# Patient Record
Sex: Female | Born: 1935 | Race: White | Hispanic: No | Marital: Married
Health system: Southern US, Community
[De-identification: ages and names within clinical notes are randomized; demographics above are authoritative.]

## PROBLEM LIST (undated history)

## (undated) DIAGNOSIS — I499 Cardiac arrhythmia, unspecified: Secondary | ICD-10-CM

## (undated) DIAGNOSIS — K625 Hemorrhage of anus and rectum: Secondary | ICD-10-CM

## (undated) DIAGNOSIS — G459 Transient cerebral ischemic attack, unspecified: Secondary | ICD-10-CM

## (undated) DIAGNOSIS — K5792 Diverticulitis of intestine, part unspecified, without perforation or abscess without bleeding: Secondary | ICD-10-CM

## (undated) DIAGNOSIS — I6529 Occlusion and stenosis of unspecified carotid artery: Secondary | ICD-10-CM

## (undated) DIAGNOSIS — I639 Cerebral infarction, unspecified: Secondary | ICD-10-CM

## (undated) DIAGNOSIS — E785 Hyperlipidemia, unspecified: Secondary | ICD-10-CM

## (undated) DIAGNOSIS — I2510 Atherosclerotic heart disease of native coronary artery without angina pectoris: Secondary | ICD-10-CM

## (undated) DIAGNOSIS — D649 Anemia, unspecified: Secondary | ICD-10-CM

## (undated) DIAGNOSIS — Z9981 Dependence on supplemental oxygen: Secondary | ICD-10-CM

## (undated) DIAGNOSIS — J309 Allergic rhinitis, unspecified: Secondary | ICD-10-CM

## (undated) DIAGNOSIS — C55 Malignant neoplasm of uterus, part unspecified: Secondary | ICD-10-CM

## (undated) DIAGNOSIS — Z9289 Personal history of other medical treatment: Secondary | ICD-10-CM

## (undated) DIAGNOSIS — K449 Diaphragmatic hernia without obstruction or gangrene: Secondary | ICD-10-CM

## (undated) DIAGNOSIS — I4891 Unspecified atrial fibrillation: Secondary | ICD-10-CM

## (undated) DIAGNOSIS — E039 Hypothyroidism, unspecified: Secondary | ICD-10-CM

## (undated) DIAGNOSIS — R0602 Shortness of breath: Secondary | ICD-10-CM

## (undated) DIAGNOSIS — J449 Chronic obstructive pulmonary disease, unspecified: Secondary | ICD-10-CM

## (undated) DIAGNOSIS — I10 Essential (primary) hypertension: Secondary | ICD-10-CM

## (undated) DIAGNOSIS — M810 Age-related osteoporosis without current pathological fracture: Secondary | ICD-10-CM

## (undated) HISTORY — DX: Cerebral infarction, unspecified: I63.9

## (undated) HISTORY — DX: Essential (primary) hypertension: I10

## (undated) HISTORY — DX: Cardiac arrhythmia, unspecified: I49.9

## (undated) HISTORY — DX: Shortness of breath: R06.02

## (undated) HISTORY — PX: CAROTID ENDARTERECTOMY: SUR193

## (undated) HISTORY — DX: Hypothyroidism, unspecified: E03.9

## (undated) HISTORY — DX: Allergic rhinitis, unspecified: J30.9

## (undated) HISTORY — DX: Transient cerebral ischemic attack, unspecified: G45.9

## (undated) HISTORY — PX: CHOLECYSTECTOMY: SHX55

## (undated) HISTORY — PX: OTHER SURGICAL HISTORY: SHX169

## (undated) HISTORY — DX: Diaphragmatic hernia without obstruction or gangrene: K44.9

## (undated) HISTORY — DX: Unspecified atrial fibrillation: I48.91

## (undated) HISTORY — DX: Anemia, unspecified: D64.9

---

## 1968-04-29 HISTORY — PX: TUBAL LIGATION: SHX77

## 1997-10-24 ENCOUNTER — Ambulatory Visit (HOSPITAL_COMMUNITY): Admission: RE | Admit: 1997-10-24 | Discharge: 1997-10-24 | Payer: Self-pay | Admitting: Internal Medicine

## 1997-10-26 ENCOUNTER — Ambulatory Visit (HOSPITAL_COMMUNITY): Admission: RE | Admit: 1997-10-26 | Discharge: 1997-10-26 | Payer: Self-pay | Admitting: Internal Medicine

## 1997-10-27 ENCOUNTER — Ambulatory Visit (HOSPITAL_COMMUNITY): Admission: RE | Admit: 1997-10-27 | Discharge: 1997-10-27 | Payer: Self-pay | Admitting: Internal Medicine

## 2001-03-07 ENCOUNTER — Inpatient Hospital Stay (HOSPITAL_COMMUNITY): Admission: EM | Admit: 2001-03-07 | Discharge: 2001-03-10 | Payer: Self-pay | Admitting: Internal Medicine

## 2001-03-07 ENCOUNTER — Encounter: Payer: Self-pay | Admitting: Emergency Medicine

## 2001-03-07 ENCOUNTER — Encounter: Payer: Self-pay | Admitting: Internal Medicine

## 2001-03-08 ENCOUNTER — Encounter: Payer: Self-pay | Admitting: Pediatrics

## 2001-10-20 ENCOUNTER — Encounter: Payer: Self-pay | Admitting: Pediatrics

## 2001-10-20 ENCOUNTER — Observation Stay (HOSPITAL_COMMUNITY): Admission: EM | Admit: 2001-10-20 | Discharge: 2001-10-21 | Payer: Self-pay | Admitting: Emergency Medicine

## 2001-10-21 ENCOUNTER — Encounter: Payer: Self-pay | Admitting: Pediatrics

## 2003-02-17 ENCOUNTER — Emergency Department (HOSPITAL_COMMUNITY): Admission: EM | Admit: 2003-02-17 | Discharge: 2003-02-18 | Payer: Self-pay | Admitting: Emergency Medicine

## 2004-08-09 ENCOUNTER — Emergency Department (HOSPITAL_COMMUNITY): Admission: EM | Admit: 2004-08-09 | Discharge: 2004-08-09 | Payer: Self-pay | Admitting: Emergency Medicine

## 2005-05-17 ENCOUNTER — Encounter: Admission: RE | Admit: 2005-05-17 | Discharge: 2005-05-17 | Payer: Self-pay | Admitting: Urology

## 2005-05-22 ENCOUNTER — Encounter (INDEPENDENT_AMBULATORY_CARE_PROVIDER_SITE_OTHER): Payer: Self-pay | Admitting: Specialist

## 2005-05-22 ENCOUNTER — Ambulatory Visit (HOSPITAL_BASED_OUTPATIENT_CLINIC_OR_DEPARTMENT_OTHER): Admission: RE | Admit: 2005-05-22 | Discharge: 2005-05-22 | Payer: Self-pay | Admitting: Urology

## 2005-06-20 ENCOUNTER — Ambulatory Visit: Payer: Self-pay | Admitting: Internal Medicine

## 2005-06-26 ENCOUNTER — Ambulatory Visit: Payer: Self-pay | Admitting: Internal Medicine

## 2005-06-27 ENCOUNTER — Ambulatory Visit: Payer: Self-pay | Admitting: Internal Medicine

## 2007-09-05 ENCOUNTER — Emergency Department (HOSPITAL_COMMUNITY): Admission: EM | Admit: 2007-09-05 | Discharge: 2007-09-06 | Payer: Self-pay | Admitting: Emergency Medicine

## 2007-09-10 ENCOUNTER — Encounter: Admission: RE | Admit: 2007-09-10 | Discharge: 2007-09-10 | Payer: Self-pay | Admitting: Internal Medicine

## 2009-05-31 ENCOUNTER — Encounter: Payer: Self-pay | Admitting: Cardiology

## 2009-06-01 ENCOUNTER — Encounter: Payer: Self-pay | Admitting: Cardiology

## 2009-06-01 ENCOUNTER — Encounter: Payer: Self-pay | Admitting: Pulmonary Disease

## 2009-06-05 ENCOUNTER — Ambulatory Visit: Payer: Self-pay | Admitting: Pulmonary Disease

## 2009-06-05 DIAGNOSIS — I10 Essential (primary) hypertension: Secondary | ICD-10-CM

## 2009-06-05 DIAGNOSIS — J984 Other disorders of lung: Secondary | ICD-10-CM

## 2009-06-05 DIAGNOSIS — R0602 Shortness of breath: Secondary | ICD-10-CM

## 2009-06-05 DIAGNOSIS — J309 Allergic rhinitis, unspecified: Secondary | ICD-10-CM | POA: Insufficient documentation

## 2009-06-05 DIAGNOSIS — G459 Transient cerebral ischemic attack, unspecified: Secondary | ICD-10-CM | POA: Insufficient documentation

## 2009-06-05 DIAGNOSIS — E039 Hypothyroidism, unspecified: Secondary | ICD-10-CM

## 2009-06-08 ENCOUNTER — Encounter: Payer: Self-pay | Admitting: Cardiology

## 2009-06-12 ENCOUNTER — Ambulatory Visit: Payer: Self-pay | Admitting: Pulmonary Disease

## 2009-06-22 ENCOUNTER — Ambulatory Visit: Payer: Self-pay | Admitting: Cardiology

## 2009-06-22 ENCOUNTER — Encounter (INDEPENDENT_AMBULATORY_CARE_PROVIDER_SITE_OTHER): Payer: Self-pay | Admitting: *Deleted

## 2009-06-22 DIAGNOSIS — R0989 Other specified symptoms and signs involving the circulatory and respiratory systems: Secondary | ICD-10-CM

## 2009-06-22 DIAGNOSIS — D508 Other iron deficiency anemias: Secondary | ICD-10-CM | POA: Insufficient documentation

## 2009-06-23 ENCOUNTER — Encounter: Payer: Self-pay | Admitting: Cardiology

## 2009-06-24 ENCOUNTER — Inpatient Hospital Stay (HOSPITAL_COMMUNITY): Admission: EM | Admit: 2009-06-24 | Discharge: 2009-06-29 | Payer: Self-pay | Admitting: Emergency Medicine

## 2009-06-24 ENCOUNTER — Ambulatory Visit: Payer: Self-pay | Admitting: Internal Medicine

## 2009-06-26 ENCOUNTER — Encounter: Payer: Self-pay | Admitting: Cardiology

## 2009-06-26 ENCOUNTER — Ambulatory Visit: Payer: Self-pay | Admitting: Vascular Surgery

## 2009-06-26 ENCOUNTER — Encounter (INDEPENDENT_AMBULATORY_CARE_PROVIDER_SITE_OTHER): Payer: Self-pay | Admitting: Internal Medicine

## 2009-07-03 ENCOUNTER — Ambulatory Visit: Payer: Self-pay | Admitting: Internal Medicine

## 2009-07-03 LAB — CONVERTED CEMR LAB

## 2009-07-10 ENCOUNTER — Ambulatory Visit: Payer: Self-pay | Admitting: Internal Medicine

## 2009-07-10 LAB — CONVERTED CEMR LAB: POC INR: 1.5

## 2009-07-17 ENCOUNTER — Ambulatory Visit: Payer: Self-pay | Admitting: Cardiology

## 2009-07-17 LAB — CONVERTED CEMR LAB: POC INR: 1.4

## 2009-07-21 ENCOUNTER — Ambulatory Visit: Payer: Self-pay | Admitting: Cardiology

## 2009-07-21 DIAGNOSIS — I739 Peripheral vascular disease, unspecified: Secondary | ICD-10-CM

## 2009-07-21 DIAGNOSIS — I6529 Occlusion and stenosis of unspecified carotid artery: Secondary | ICD-10-CM

## 2009-07-21 DIAGNOSIS — I4891 Unspecified atrial fibrillation: Secondary | ICD-10-CM | POA: Insufficient documentation

## 2009-07-24 ENCOUNTER — Ambulatory Visit: Payer: Self-pay | Admitting: Cardiovascular Disease

## 2009-07-25 ENCOUNTER — Telehealth: Payer: Self-pay | Admitting: Cardiology

## 2009-07-28 ENCOUNTER — Ambulatory Visit: Payer: Self-pay

## 2009-07-28 ENCOUNTER — Encounter: Payer: Self-pay | Admitting: Cardiology

## 2009-07-29 ENCOUNTER — Telehealth (INDEPENDENT_AMBULATORY_CARE_PROVIDER_SITE_OTHER): Payer: Self-pay | Admitting: Physician Assistant

## 2009-08-01 ENCOUNTER — Telehealth: Payer: Self-pay | Admitting: Cardiology

## 2009-08-01 ENCOUNTER — Ambulatory Visit: Payer: Self-pay | Admitting: Vascular Surgery

## 2009-08-02 ENCOUNTER — Ambulatory Visit: Payer: Self-pay | Admitting: Cardiology

## 2009-08-02 DIAGNOSIS — I5032 Chronic diastolic (congestive) heart failure: Secondary | ICD-10-CM

## 2009-08-02 LAB — CONVERTED CEMR LAB: POC INR: 1.9

## 2009-08-03 ENCOUNTER — Telehealth (INDEPENDENT_AMBULATORY_CARE_PROVIDER_SITE_OTHER): Payer: Self-pay | Admitting: *Deleted

## 2009-08-04 ENCOUNTER — Inpatient Hospital Stay (HOSPITAL_COMMUNITY): Admission: RE | Admit: 2009-08-04 | Discharge: 2009-08-05 | Payer: Self-pay | Admitting: Vascular Surgery

## 2009-08-04 ENCOUNTER — Ambulatory Visit: Payer: Self-pay | Admitting: Internal Medicine

## 2009-08-04 ENCOUNTER — Encounter: Payer: Self-pay | Admitting: Vascular Surgery

## 2009-08-04 ENCOUNTER — Ambulatory Visit: Payer: Self-pay | Admitting: Vascular Surgery

## 2009-08-04 HISTORY — PX: CAROTID ENDARTERECTOMY: SUR193

## 2009-08-07 ENCOUNTER — Telehealth: Payer: Self-pay | Admitting: Cardiology

## 2009-08-09 ENCOUNTER — Ambulatory Visit: Payer: Self-pay | Admitting: Internal Medicine

## 2009-08-09 LAB — CONVERTED CEMR LAB: POC INR: 1.2

## 2009-08-16 ENCOUNTER — Ambulatory Visit: Payer: Self-pay | Admitting: Cardiology

## 2009-08-22 ENCOUNTER — Ambulatory Visit: Payer: Self-pay | Admitting: Vascular Surgery

## 2009-08-23 ENCOUNTER — Ambulatory Visit: Payer: Self-pay | Admitting: Internal Medicine

## 2009-08-23 ENCOUNTER — Encounter: Payer: Self-pay | Admitting: Cardiology

## 2009-09-08 ENCOUNTER — Ambulatory Visit: Payer: Self-pay | Admitting: Cardiology

## 2009-09-14 ENCOUNTER — Telehealth: Payer: Self-pay | Admitting: Cardiology

## 2009-09-26 ENCOUNTER — Telehealth: Payer: Self-pay | Admitting: Cardiology

## 2009-09-29 ENCOUNTER — Telehealth: Payer: Self-pay | Admitting: Cardiology

## 2009-09-30 ENCOUNTER — Emergency Department (HOSPITAL_COMMUNITY): Admission: EM | Admit: 2009-09-30 | Discharge: 2009-09-30 | Payer: Self-pay | Admitting: Emergency Medicine

## 2009-10-06 ENCOUNTER — Ambulatory Visit: Payer: Self-pay | Admitting: Cardiovascular Disease

## 2009-10-06 ENCOUNTER — Telehealth (INDEPENDENT_AMBULATORY_CARE_PROVIDER_SITE_OTHER): Payer: Self-pay | Admitting: *Deleted

## 2009-10-06 LAB — CONVERTED CEMR LAB: POC INR: 1.7

## 2009-10-24 ENCOUNTER — Ambulatory Visit: Payer: Self-pay | Admitting: Cardiology

## 2009-10-24 ENCOUNTER — Telehealth: Payer: Self-pay | Admitting: Cardiology

## 2009-11-08 ENCOUNTER — Telehealth: Payer: Self-pay | Admitting: Cardiology

## 2009-11-08 ENCOUNTER — Telehealth: Payer: Self-pay | Admitting: Pulmonary Disease

## 2009-11-08 ENCOUNTER — Ambulatory Visit: Payer: Self-pay | Admitting: Internal Medicine

## 2009-11-09 ENCOUNTER — Ambulatory Visit: Payer: Self-pay | Admitting: Gastroenterology

## 2009-11-09 ENCOUNTER — Ambulatory Visit: Payer: Self-pay | Admitting: Cardiology

## 2009-11-09 ENCOUNTER — Inpatient Hospital Stay (HOSPITAL_COMMUNITY): Admission: EM | Admit: 2009-11-09 | Discharge: 2009-11-12 | Payer: Self-pay | Admitting: Emergency Medicine

## 2009-11-14 ENCOUNTER — Telehealth: Payer: Self-pay | Admitting: Pulmonary Disease

## 2009-11-15 ENCOUNTER — Telehealth: Payer: Self-pay | Admitting: Cardiology

## 2009-11-16 ENCOUNTER — Telehealth: Payer: Self-pay | Admitting: Cardiology

## 2009-11-16 ENCOUNTER — Encounter: Payer: Self-pay | Admitting: Cardiology

## 2009-11-17 ENCOUNTER — Ambulatory Visit: Payer: Self-pay | Admitting: Internal Medicine

## 2009-11-17 DIAGNOSIS — D649 Anemia, unspecified: Secondary | ICD-10-CM | POA: Insufficient documentation

## 2009-11-17 DIAGNOSIS — R634 Abnormal weight loss: Secondary | ICD-10-CM

## 2009-11-21 ENCOUNTER — Ambulatory Visit: Payer: Self-pay | Admitting: Cardiovascular Disease

## 2009-11-21 ENCOUNTER — Telehealth (INDEPENDENT_AMBULATORY_CARE_PROVIDER_SITE_OTHER): Payer: Self-pay | Admitting: *Deleted

## 2009-11-21 LAB — CONVERTED CEMR LAB
Basophils Absolute: 0 10*3/uL (ref 0.0–0.1)
Basophils Relative: 0.6 % (ref 0.0–3.0)
Eosinophils Absolute: 0.3 10*3/uL (ref 0.0–0.7)
HCT: 29.2 % — ABNORMAL LOW (ref 36.0–46.0)
Lymphocytes Relative: 18.6 % (ref 12.0–46.0)
MCHC: 34.4 g/dL (ref 30.0–36.0)
MCV: 85.5 fL (ref 78.0–100.0)
Monocytes Relative: 12.8 % — ABNORMAL HIGH (ref 3.0–12.0)
Neutrophils Relative %: 63.7 % (ref 43.0–77.0)

## 2009-11-23 ENCOUNTER — Encounter: Payer: Self-pay | Admitting: Cardiology

## 2009-11-23 ENCOUNTER — Ambulatory Visit: Payer: Self-pay

## 2009-11-23 ENCOUNTER — Encounter: Payer: Self-pay | Admitting: Internal Medicine

## 2009-12-14 ENCOUNTER — Ambulatory Visit: Payer: Self-pay | Admitting: Pulmonary Disease

## 2009-12-19 ENCOUNTER — Ambulatory Visit: Payer: Self-pay | Admitting: Cardiovascular Disease

## 2010-01-16 ENCOUNTER — Ambulatory Visit: Payer: Self-pay | Admitting: Cardiology

## 2010-03-27 ENCOUNTER — Ambulatory Visit: Payer: Self-pay | Admitting: Vascular Surgery

## 2010-05-23 ENCOUNTER — Encounter (INDEPENDENT_AMBULATORY_CARE_PROVIDER_SITE_OTHER): Payer: Self-pay | Admitting: Pharmacist

## 2010-05-24 ENCOUNTER — Encounter: Payer: Self-pay | Admitting: Cardiology

## 2010-05-27 LAB — CONVERTED CEMR LAB
BUN: 28 mg/dL — ABNORMAL HIGH (ref 6–23)
Chloride: 97 meq/L (ref 96–112)
Glucose, Bld: 114 mg/dL — ABNORMAL HIGH (ref 70–99)
Lymphocytes Relative: 22.6 % (ref 12.0–46.0)
MCHC: 33.5 g/dL (ref 30.0–36.0)
MCV: 86.7 fL (ref 78.0–100.0)
Monocytes Absolute: 0.7 10*3/uL (ref 0.1–1.0)
Monocytes Relative: 13.4 % — ABNORMAL HIGH (ref 3.0–12.0)
Neutrophils Relative %: 59.9 % (ref 43.0–77.0)
Potassium: 5.7 meq/L — ABNORMAL HIGH (ref 3.5–5.1)
Prothrombin Time: 10.2 s (ref 9.1–11.7)
RBC: 3.8 M/uL — ABNORMAL LOW (ref 3.87–5.11)
RDW: 13.3 % (ref 11.5–14.6)
Sodium: 133 meq/L — ABNORMAL LOW (ref 135–145)
aPTT: 28.5 s (ref 21.7–28.8)

## 2010-05-30 DIAGNOSIS — C55 Malignant neoplasm of uterus, part unspecified: Secondary | ICD-10-CM

## 2010-05-30 HISTORY — DX: Malignant neoplasm of uterus, part unspecified: C55

## 2010-05-31 NOTE — Procedures (Signed)
Summary: Summary Report  Summary Report   Imported By: Erle Crocker 12/01/2009 11:21:39  _____________________________________________________________________  External Attachment:    Type:   Image     Comment:   External Document

## 2010-05-31 NOTE — Medication Information (Signed)
Summary: rov/sp  Anticoagulant Therapy  Managed by: Bethena Midget, RN, BSN PCP: Geoffry Paradise Supervising MD: Gala Romney MD, Reuel Boom Indication 1: Atrial Fibrillation Lab Used: [facility] [place] Site: [address] INR POC 2.4 INR RANGE 2.0-3.0  Dietary changes: no    Health status changes: no    Bleeding/hemorrhagic complications: no    Recent/future hospitalizations: no    Any changes in medication regimen? yes       Details: 1/2 tab Ambien PRN   Recent/future dental: no  Any missed doses?: no       Is patient compliant with meds? yes       Allergies: No Known Drug Allergies  Anticoagulation Management History:      The patient is taking warfarin and comes in today for a routine follow up visit.  Positive risk factors for bleeding include an age of 75 years or older and history of CVA/TIA.  The bleeding index is 'intermediate risk'.  Positive CHADS2 values include History of CHF, History of HTN, Age > 53 years old, and Prior Stroke/CVA/TIA.  Her last INR was 1.0 ratio.  Anticoagulation responsible provider: Malaney Mcbean MD, Reuel Boom.  INR POC: 2.4.  Cuvette Lot#: [id number].  Exp: 09/2010.    Anticoagulation Management Assessment/Plan:      The patient's current anticoagulation dose is Coumadin 2.5 mg tabs: Take as directed( ON HOLD FOR SURGERY)08/02/2009.  The target INR is 2.0-3.0.  The next INR is due 09/08/2009.  Anticoagulation instructions were given to patient.  Results were reviewed/authorized by Bethena Midget, RN, BSN.  She was notified by Bethena Midget, RN, BSN.         Prior Anticoagulation Instructions: INR 1.7  Take 2 1/2 tablets today then increase dose to 2 tablets every day except 1 tablet on Sunday   Current Anticoagulation Instructions: INR 2.4 Continue 2 pills everyday except 1 pill on Sundays. Recheck in 2 week.

## 2010-05-31 NOTE — Medication Information (Signed)
Summary: rov/ewj  Anticoagulant Therapy  Managed by: Weston Brass, PharmD PCP: Geoffry Paradise Supervising MD: Shirlee Latch MD, Freida Busman Indication 1: Atrial Fibrillation Lab Used: [facility] [place] Site: [address] INR POC 1.7 INR RANGE 2.0-3.0  Dietary changes: no    Health status changes: yes       Details: pt not sleeping well and feeling weak since recent procedure.  She has had some diarrhea over the past 2 days.  Suggested she f/u with Dr. Hart [place]  Bleeding/hemorrhagic complications: no    Recent/future hospitalizations: no    Any changes in medication regimen? yes       Details: stopped abx for UTI  Recent/future dental: no  Any missed doses?: no       Is patient compliant with meds? yes       Allergies: No Known Drug Allergies  Anticoagulation Management History:      The patient is taking warfarin and comes in today for a routine follow up visit.  Positive risk factors for bleeding include an age of 75 years or older and history of CVA/TIA.  The bleeding index is 'intermediate risk'.  Positive CHADS2 values include History of CHF, History of HTN, Age > 22 years old, and Prior Stroke/CVA/TIA.  Her last INR was 1.0 ratio.  Anticoagulation responsible provider: Shirlee Latch MD, Conya Ellinwood.  INR POC: 1.7.  Cuvette Lot#: [id number].  Exp: 08/2010.    Anticoagulation Management Assessment/Plan:      The patient's current anticoagulation dose is Coumadin 2.5 mg tabs: Take as directed( ON HOLD FOR SURGERY)08/02/2009.  The target INR is 2.0-3.0.  The next INR is due 08/29/2009.  Anticoagulation instructions were given to patient.  Results were reviewed/authorized by Weston Brass, PharmD.  She was notified by Weston Brass PharmD.         Prior Anticoagulation Instructions: INR 1.2  Take 3 tablets today, then take 2 tablets tomorrow, then resume same dosage 2 tablets daily except 1 tablet on Sundays and Thursdays.  Recheck in 1 week.  Current Anticoagulation Instructions: INR  1.7  Take 2 1/2 tablets today then increase dose to 2 tablets every day except 1 tablet on Sunday

## 2010-05-31 NOTE — Progress Notes (Signed)
Summary: refill/diovan/hct  pt daughter should she be taking?  Phone Note Refill Request Call back at [phone]   Refills Requested: Medication #1:  DIOVAN HCT 80-12.5 MG TABS take 1/2  tab by mouth once daily   Supply Requested: 3 months Is she still suppose be taking this med? If so please send [facility]   Method Requested: Fax to Local Pharmacy Initial call taken by: Migdalia Dk,  September 29, 2009 3:08 PM    Prescriptions: DIOVAN HCT 80-12.5 MG TABS (VALSARTAN-HYDROCHLOROTHIAZIDE) take 1/2  tab by mouth once daily  #15 x 1   Entered by:   Charolotte Capuchin, RN   Authorized by:   Lenoria Farrier, MD, [facility]   Signed by:   Charolotte Capuchin, RN on 09/29/2009   Method used:   Electronically to        [facility] [phone]* (retail)       [address]       [place], [place]  [address]       Ph: [phone]       Fax: [phone]   RxID:   [id number]   Appended Document: refill/diovan/hct  pt daughter should she be taking? spoke with daughter who is asking if pt is to remain on Diovan.HCT 80/12.5 1/2 tablet daily.  Per Dr Regino Schultze note at last ROV pt is to remain on said medication.  Daughter states Dr Lanell Matar office wont refill because "he doesn't want her on it" though she doesn't know why.  Refill given but we need to call Dr Lanell Matar office on Monday to find out why and when he stopped the medication, if he did.  Rx sent electronically and I also call the [facility] pharmacy to make sure they recieved the rx.  Appended Document: refill/diovan/hct  pt daughter should she be taking? I called Dr. Lanell Matar nurse to inquire as to why he did not want her on the Diovan. Per the nurse, he did not know she was taking this, so he couldn't refill it for her. Per Dr. Juanda Chance, the pt may stop diovan since her last BP was under good control, but she will need a BP check here in about a week, or at Dr. Lanell Matar office if she is to see him sometime soon.    Appended Document: refill/diovan/hct  pt daughter should she be taking? I spoke with the pt's daughter, [name], and made her aware that per Dr. Juanda Chance, the pt could d/c her diovan/hct. She will need BP f/u. Per [name], she just filled the pt's pill box today. She will talk with the pt and see if she wants to d/c diovan. If the pt stops her diovan before 6/10, we will check her BP when she comes for her coumadin check. If she is unable to get the pills out of the pt's pill box, they will check her bp's at home and she is also due for f/u with Dr. Jacky Kindle soon. [name] will advise US of the pt's decision. She also states the pt is very cold all the time. I explained this is probably r/t her coumadin. They will speak with Orma Flaming D on friday at her coumadin check.

## 2010-05-31 NOTE — Progress Notes (Signed)
Summary: sob  Phone Note Call from Patient Call back at Home Phone [phone]   Caller: Daughter-Raynelle Call For: [name] Reason for Call: Talk to Nurse Summary of Call: having troule breathing, sob on exertion.  gives out making the bed.  Please advise. Initial call taken by: Eugene Gavia,  November 08, 2009 9:30 AM  Follow-up for Phone Call        called and spoke with pts daughter and she stated that pt has been like this since june---sometimes she gets out of breath trying to make the bed or getting ready to go somewhere.   she is tired all the time but the daughter stated that everything checked out with cardio doc and her primary care doc.  daughter just wanted to make appt for pt in august---8-2 at 11am to see [facility].  pts daughter is aware of date and time of appt. Randell [address] [facility]  November 08, 2009 9:47 AM

## 2010-05-31 NOTE — Medication Information (Signed)
Summary: rov/cb  Anticoagulant Therapy  Managed by: Cloyde Reams, RN, BSN PCP: Geoffry Paradise Supervising MD: Johney Frame MD, Fayrene Fearing Indication 1: Atrial Fibrillation Lab Used: [facility] [place] Site: [address] INR POC 1.2 INR RANGE 2.0-3.0  Dietary changes: yes       Details: Decr appetite d/t nausea s/p caraotid endarterectomy on 08/04/09.    Health status changes: no    Bleeding/hemorrhagic complications: no    Recent/future hospitalizations: no    Any changes in medication regimen? no    Recent/future dental: no  Any missed doses?: yes     Details: Off coumadin 08/01/09-08/04/09.    Is patient compliant with meds? yes       Current Medications (verified): 1)  Synthroid 50 Mcg Tabs (Levothyroxine Sodium) .... Take 1 Tablet By Mouth Once A Day 2)  Metoprolol Tartrate 50 Mg Tabs (Metoprolol Tartrate) .... Take One Tablet By Mouth Twice A Day 3)  Naproxen 500 Mg Tabs (Naproxen) .... Take 1 Tabs By Mouth Daily 4)  Aspirin Low Dose 81 Mg Tabs (Aspirin) .... Take 1 Tablet By Mouth Once A Day 5)  Caltrate 600+d 600-400 Mg-Unit Tabs (Calcium Carbonate-Vitamin D) .... Take 2 Tabs By Mouth Daily 6)  Nitrostat 0.4 Mg Subl (Nitroglycerin) .Marland Kitchen.. 1 Tablet Under Tongue At Onset of Chest Pain; You May Repeat Every 5 Minutes For Up To 3 Doses. 7)  Coumadin 2.5 Mg Tabs (Warfarin Sodium) .... Take As Directed( On Hold For Surgery)08/02/2009 8)  Cartia Xt 240 Mg Xr24h-Cap (Diltiazem Hcl Coated Beads) .... Take One Tab By Mouth Once Daily 9)  Multaq 400 Mg Tabs (Dronedarone Hcl) .... Take One Tablet By Mouth Twice Daily With Meals 10)  Zocor 20 Mg Tabs (Simvastatin) .... Take One Tablet By Mouth Daily 11)  Inhaler .... As Directed 12)  Nasal Spray .... Prn 13)  Furosemide 20 Mg Tabs (Furosemide) .... Take One Tablet By Mouth Daily. 14)  Diovan Hct 80-12.5 Mg Tabs (Valsartan-Hydrochlorothiazide) .... Take 1/2  Tab By Mouth Once Daily  Allergies (verified): No Known Drug  Allergies  Anticoagulation Management History:      The patient is taking warfarin and comes in today for a routine follow up visit.  Positive risk factors for bleeding include an age of 75 years or older and history of CVA/TIA.  The bleeding index is 'intermediate risk'.  Positive CHADS2 values include History of CHF, History of HTN, Age > 75 years old, and Prior Stroke/CVA/TIA.  Her last INR was 1.0 ratio.  Anticoagulation responsible provider: Corgan Mormile MD, Fayrene Fearing.  INR POC: 1.2.  Cuvette Lot#: [id number].  Exp: 08/2010.    Anticoagulation Management Assessment/Plan:      The patient's current anticoagulation dose is Coumadin 2.5 mg tabs: Take as directed( ON HOLD FOR SURGERY)08/02/2009.  The next INR is due 08/16/2009.  Anticoagulation instructions were given to patient.  Results were reviewed/authorized by Cloyde Reams, RN, BSN.  She was notified by Cloyde Reams RN.         Prior Anticoagulation Instructions: INR 1.9. Take 2 tablets daily except 1 tablet Thurs and Sun.  If restarted after surgery, restart this dose and return to clinic next Friday for follow-up.  Current Anticoagulation Instructions: INR 1.2  Take 3 tablets today, then take 2 tablets tomorrow, then resume same dosage 2 tablets daily except 1 tablet on Sundays and Thursdays.  Recheck in 1 week.

## 2010-05-31 NOTE — Consult Note (Signed)
Summary: [facility]   Imported By: Marylou Mccoy 06/22/2009 11:25:18  _____________________________________________________________________  External Attachment:    Type:   Image     Comment:   External Document

## 2010-05-31 NOTE — Progress Notes (Signed)
Summary: Multaq samples  Phone Note Other Incoming   Caller: pt Details for Reason: here for PT/INR but asking to samples Summary of Call: Samples of Multaq 400 mg #30 Lot # [id number] exp 03/2012 given to pt. Initial call taken by: Charolotte Capuchin, RN,  October 06, 2009 10:55 AM

## 2010-05-31 NOTE — Progress Notes (Signed)
  Phone Note Call from Patient   Call For: Dr Charlies Constable Reason for Call: Acute Illness Summary of Call: Pt dtr called because pt has LE edema. Pt dtr's husb funeral today, pt has been on her feet more than usual last few days. Advised her pt could be evaluated in the closest ER but could wait and be seen in ofc if not having Sx. Pt denies CP/SOB/numbness/tingling. Was taken off HCTZ during hosp stay last month. Advised dtr pt could take HCTZ in am if no better. She should keep legs elevated. Initial call taken by: Park Breed PA-C,  July 29, 2009 9:54 PM

## 2010-05-31 NOTE — Medication Information (Signed)
Summary: rov/sp  Anticoagulant Therapy  Managed by: Bethena Midget, RN, BSN Referring MD: Juanda Chance MD, Bruce PCP: Geoffry Paradise Supervising MD: Eden Emms MD, Theron Arista Indication 1: Atrial Fibrillation Lab Used: [facility] [place] Site: [address] INR POC 2.5 INR RANGE 2.0-3.0  Dietary changes: no    Health status changes: yes       Details: UTI symptoms, recently treated with 2 rounds of ABX  Bleeding/hemorrhagic complications: yes       Details: some blood note in undergarments this AM  Recent/future hospitalizations: yes       Details: Was in hospital last week 7/13 to 7/17 for SOB- Dx GERD   Any changes in medication regimen? no    Recent/future dental: no  Any missed doses?: no       Is patient compliant with meds? yes       Current Medications (verified): 1)  Synthroid 50 Mcg Tabs (Levothyroxine Sodium) .... Take 1 Tablet By Mouth Once A Day 2)  Zebeta 5 Mg Tabs (Bisoprolol Fumarate) .... One By Mouth Daily 3)  Aspirin Low Dose 81 Mg Tabs (Aspirin) .... Take 1 Tablet By Mouth Once A Day 4)  Caltrate 600+d 600-400 Mg-Unit Tabs (Calcium Carbonate-Vitamin D) .... Hold 5)  Nitrostat 0.4 Mg Subl (Nitroglycerin) .Marland Kitchen.. 1 Tablet Under Tongue At Onset of Chest Pain; You May Repeat Every 5 Minutes For Up To 3 Doses. 6)  Coumadin 5 Mg Tabs (Warfarin Sodium) .... As Directed 7)  Cartia Xt 240 Mg Xr24h-Cap (Diltiazem Hcl Coated Beads) .... Take One Tab By Mouth Once Daily 8)  Multaq 400 Mg Tabs (Dronedarone Hcl) .... Take One Tablet By Mouth Twice Daily With Meals 9)  Zocor 20 Mg Tabs (Simvastatin) .... Take One Tablet By Mouth Daily 10)  Inhaler .... As Directed 11)  Furosemide 20 Mg Tabs (Furosemide) .... Take One Tablet By Mouth Daily. 12)  Warfarin Sodium 5 Mg Tabs (Warfarin Sodium) .... Mon-Sat 5 Mg Sun 1/2 Mg 13)  Ambien 5 Mg Tabs (Zolpidem Tartrate) .... One By Mouth At Bedtime Daily 14)  Pantoprazole Sodium 40 Mg Tbec (Pantoprazole Sodium) .... Twice Daily Before  Meals 15)  Ferrex 150 150 Mg Caps (Polysaccharide Iron Complex) .... One Twice A Day  Allergies (verified): No Known Drug Allergies  Anticoagulation Management History:      The patient is taking warfarin and comes in today for a routine follow up visit.  Positive risk factors for bleeding include an age of 58 years or older, history of CVA/TIA, and presence of serious comorbidities.  The bleeding index is 'high risk'.  Positive CHADS2 values include History of CHF, History of HTN, Age > 58 years old, and Prior Stroke/CVA/TIA.  Her last INR was 1.0 ratio.  Anticoagulation responsible provider: Eden Emms MD, Theron Arista.  INR POC: 2.5.  Cuvette Lot#: [id number].  Exp: 01/2011.    Anticoagulation Management Assessment/Plan:      The patient's current anticoagulation dose is Coumadin 5 mg tabs: as directed, Warfarin sodium 5 mg tabs: mon-sat 5 mg sun 1/2 mg.  The target INR is 2.0-3.0.  The next INR is due 12/19/2009.  Anticoagulation instructions were given to patient.  Results were reviewed/authorized by Bethena Midget, RN, BSN.  She was notified by Weston Brass PharmD.         Prior Anticoagulation Instructions: INR 2.7  Continue same dose of 1 tablet every day except 1/2 tablet on Sunday.   Current Anticoagulation Instructions: INR 2.5 Continue 5mg s everyday except 2.5mg s on  Sundays. Recheck in 4 weeks. Recheck in 4 weeks.

## 2010-05-31 NOTE — Assessment & Plan Note (Signed)
Summary: ROV   Visit Type:  Follow-up Referring Provider:  Geoffry Paradise Primary Provider:  Geoffry Paradise  CC:  leg edema and leg pain--PT will starting an antibotic for a UTI.  History of Present Illness: The patient is 77 years old and returns for management of atrial fibrillation and shortness of breath. We evaluate her for symptoms of shortness of breath and she underwent catheterization and was found to have nonobstructive coronary disease. She went into atrial fibrillation after the catheterization and has been treated with multi-pack and Coumadin and has not had recurrence.  We have thought that her shortness of breath was due to many factors including hypertension and anemia. We made adjustments in her blood pressure medicines. She says since her last visit she has had some swelling in her lower extremities. We have increased her diltiazem from 240-300 and apparently she was also taking amlodipine 5 mg from another pharmacy which we were unaware of.  She also has created an 80% carotid stenosis on the right and is scheduled for surgery by Dr. Hart Rochester this Friday. We are stopping the Coumadin 5 days prior to the surgery without bridging.   When I saw her last time she was having some symptoms of calf pain with exertion and had difficulty feeling pulses and we did peripheral Doppler studies. This did show some plaque but the indices were normal.  Current Medications (verified): 1)  Synthroid 50 Mcg Tabs (Levothyroxine Sodium) .... Take 1 Tablet By Mouth Once A Day 2)  Metoprolol Tartrate 50 Mg Tabs (Metoprolol Tartrate) .... Take One Tablet By Mouth Twice A Day 3)  Naproxen 500 Mg Tabs (Naproxen) .... Take 1 Tabs By Mouth Daily 4)  Aspirin Low Dose 81 Mg Tabs (Aspirin) .... Take 1 Tablet By Mouth Once A Day 5)  Caltrate 600+d 600-400 Mg-Unit Tabs (Calcium Carbonate-Vitamin D) .... Take 2 Tabs By Mouth Daily 6)  Nitrostat 0.4 Mg Subl (Nitroglycerin) .Marland Kitchen.. 1 Tablet Under Tongue At Onset  of Chest Pain; You May Repeat Every 5 Minutes For Up To 3 Doses. 7)  Coumadin 2.5 Mg Tabs (Warfarin Sodium) .... Take As Directed( On Hold For Surgery)08/02/2009 8)  Cardizem Cd 300 Mg Xr24h-Cap (Diltiazem Hcl Coated Beads) .... One Tab By Mouth Once Daily 9)  Multaq 400 Mg Tabs (Dronedarone Hcl) .... Take One Tablet By Mouth Twice Daily With Meals 10)  Zocor 20 Mg Tabs (Simvastatin) .... Take One Tablet By Mouth Daily 11)  Inhaler .... As Directed 12)  Amlodipine Besylate 5 Mg Tabs (Amlodipine Besylate) .... Take One Tablet By Mouth Daily 13)  Nasal Spray .... Prn  Allergies (verified): No Known Drug Allergies  Past History:  Past Medical History: Reviewed history from 06/05/2009 and no changes required.  TIA (ICD-435.9) HYPOTHYROIDISM (ICD-244.9) HYPERTENSION (ICD-401.9) ALLERGIC RHINITIS (ICD-477.9)    Review of Systems       ROS is negative except as outlined in HPI.   Vital Signs:  Patient profile:   77 year old female Height:      62 inches Weight:      145 pounds Pulse rate:   56 / minute BP sitting:   119 / 68  (left arm) Cuff size:   regular  Vitals Entered By: Burnett Kanaris, CNA (August 02, 2009 12:15 PM)  Physical Exam  Additional Exam:  Gen. Well-nourished, in no distress   Neck: JVD 1 cm above the clavicle, thyroid not enlarged, no carotid bruits Lungs: No tachypnea, clear without rales, rhonchi or wheezes Cardiovascular:  Rhythm regular, PMI not displaced,  heart sounds  normal, no murmurs or gallops, 1+ bilateral lower extremity peripheral edema, pulses normal in all 4 extremities. Abdomen: BS normal, abdomen soft and non-tender without masses or organomegaly, no hepatosplenomegaly. MS: No deformities, no cyanosis or clubbing   Neuro:  No focal sns   Skin:  no lesions    Impression & Recommendations:  Problem # 1:  ATRIAL FIBRILLATION (ICD-427.31)  This appears well controlled on current medications. Her updated medication list for this problem  includes:    Metoprolol Tartrate 50 Mg Tabs (Metoprolol tartrate) .Marland Kitchen... Take one tablet by mouth twice a day    Aspirin Low Dose 81 Mg Tabs (Aspirin) .Marland Kitchen... Take 1 tablet by mouth once a day    Coumadin 2.5 Mg Tabs (Warfarin sodium) .Marland Kitchen... Take as directed( on hold for surgery)08/02/2009    Multaq 400 Mg Tabs (Dronedarone hcl) .Marland Kitchen... Take one tablet by mouth twice daily with meals  Her updated medication list for this problem includes:    Metoprolol Tartrate 50 Mg Tabs (Metoprolol tartrate) .Marland Kitchen... Take one tablet by mouth twice a day    Aspirin Low Dose 81 Mg Tabs (Aspirin) .Marland Kitchen... Take 1 tablet by mouth once a day    Coumadin 2.5 Mg Tabs (Warfarin sodium) .Marland Kitchen... Take as directed( on hold for surgery)08/02/2009    Multaq 400 Mg Tabs (Dronedarone hcl) .Marland Kitchen... Take one tablet by mouth twice daily with meals  Problem # 2:  CAROTID ARTERY STENOSIS, RIGHT (ICD-433.10) She is scheduled for carotid endarterectomy this Friday by Dr. Hart Rochester. She is on Coumadin for atrial fibrillation. She had a remote history of TIA 7 or 8 years ago that we think was unrelated to atrial fibrillation. I think we can stop the Coumadin without bridging with heparin. Her updated medication list for this problem includes:    Aspirin Low Dose 81 Mg Tabs (Aspirin) .Marland Kitchen... Take 1 tablet by mouth once a day    Coumadin 2.5 Mg Tabs (Warfarin sodium) .Marland Kitchen... Take as directed( on hold for surgery)08/02/2009  Problem # 3:  PVD (ICD-443.9) We thought she may have claudication. Her peripheral arterial Doppler studies are pretty good and I think it's less likely that her symptoms are related to claudication. She does have some peripheral vascular disease but it does not appear to be very advanced.  Problem # 4:  HYPERTENSION (ICD-401.9) Her blood pressure is well controlled today but there is a lot of confusion about her medications. We plan to increase her Cardizem from 240-300 but this apparently was not done and she was given 240 from the  pharmacist. She is also taking amlodipine from another pharmacy. We will stop the amlodipine and we will have her take the 240 mg diltiazem 300 mg because of the peripheral edema. The following medications were removed from the medication list:    Amlodipine Besylate 5 Mg Tabs (Amlodipine besylate) .Marland Kitchen... Take one tablet by mouth daily Her updated medication list for this problem includes:    Metoprolol Tartrate 50 Mg Tabs (Metoprolol tartrate) .Marland Kitchen... Take one tablet by mouth twice a day    Aspirin Low Dose 81 Mg Tabs (Aspirin) .Marland Kitchen... Take 1 tablet by mouth once a day    Cartia Xt 240 Mg Xr24h-cap (Diltiazem hcl coated beads) .Marland Kitchen... Take one tab by mouth once daily    Furosemide 20 Mg Tabs (Furosemide) .Marland Kitchen... Take one tablet by mouth daily.    Diovan Hct 80-12.5 Mg Tabs (Valsartan-hydrochlorothiazide) .Marland Kitchen... Take 1/2  tab by mouth  once daily  Problem # 5:  DIASTOLIC HEART FAILURE, CHRONIC (ICD-428.32) She has evidence of volume overload or diastolic heart failure with JVD and peripheral edema. We will start her on Lasix 20 mg a day. Some edema could be related to her calcium channel blockers which we are decreasing. This could also possibly be contributing to her shortness of breath that she has had for some time. The following medications were removed from the medication list:    Amlodipine Besylate 5 Mg Tabs (Amlodipine besylate) .Marland Kitchen... Take one tablet by mouth daily Her updated medication list for this problem includes:    Metoprolol Tartrate 50 Mg Tabs (Metoprolol tartrate) .Marland Kitchen... Take one tablet by mouth twice a day    Aspirin Low Dose 81 Mg Tabs (Aspirin) .Marland Kitchen... Take 1 tablet by mouth once a day    Nitrostat 0.4 Mg Subl (Nitroglycerin) .Marland Kitchen... 1 tablet under tongue at onset of chest pain; you may repeat every 5 minutes for up to 3 doses.    Coumadin 2.5 Mg Tabs (Warfarin sodium) .Marland Kitchen... Take as directed( on hold for surgery)08/02/2009    Cartia Xt 240 Mg Xr24h-cap (Diltiazem hcl coated beads) .Marland Kitchen... Take  one tab by mouth once daily    Multaq 400 Mg Tabs (Dronedarone hcl) .Marland Kitchen... Take one tablet by mouth twice daily with meals    Furosemide 20 Mg Tabs (Furosemide) .Marland Kitchen... Take one tablet by mouth daily.    Diovan Hct 80-12.5 Mg Tabs (Valsartan-hydrochlorothiazide) .Marland Kitchen... Take 1/2  tab by mouth once daily  Patient Instructions: 1)  Your physician recommends that you schedule a follow-up appointment in: 3 months. 2)  Your physician recommends that you return for lab work in: 1 week.- bmet (414.01) 3)  Your physician has recommended you make the following change in your medication: 1) START lasix (furosemide) 20mg  once daily, 2) DECREASE diltiazem to 240mg  once daily, 3) Continue Diovan/HCTZ 80/12.5mg  1/2 tablet once daily, 4) STOP amlodipine. Prescriptions: FUROSEMIDE 20 MG TABS (FUROSEMIDE) Take one tablet by mouth daily.  #30 x 11   Entered by:   Sherri Rad, RN, BSN   Authorized by:   Lenoria Farrier, MD, [facility]   Signed by:   Sherri Rad, RN, BSN on 08/02/2009   Method used:   Electronically to        [facility] [phone]* (retail)       [address]       [place], [place]  [address]       Ph: [phone]       Fax: [phone]   RxID:   [id number]

## 2010-05-31 NOTE — Assessment & Plan Note (Signed)
Summary: eph   Referring Provider:  Geoffry Paradise Primary Provider:  Geoffry Paradise  CC:  sob also pt complains of a cough.  History of Present Illness: The patient is 77 years old and return for management of atrial fibrillation and shortness of breath. I recently saw her in consultation for shortness of breath that I thought was likely an anginal equivalent. He brought her in the hospital and she underwent catheterization but was found to have nonobstructive coronary disease. She went into atrial fibrillation at the time of catheterization and was discharged on amiodarone and Coumadin.  The etiology of her shortness of breath is not clear. it is probably multifactorial related to anemia, some COPD, hypertension. She may also have some claudication since she has calf pain with walking and this is worse when she walks up a hill.  She had an echocardiogram in the hospital which showed good LV function with an ejection fraction of 65-70% and LVH.  Current Medications (verified): 1)  Synthroid 50 Mcg Tabs (Levothyroxine Sodium) .... Take 1 Tablet By Mouth Once A Day 2)  Metoprolol Tartrate 50 Mg Tabs (Metoprolol Tartrate) .... Take One Tablet By Mouth Twice A Day 3)  Naproxen 500 Mg Tabs (Naproxen) .... Take 1 Tabs By Mouth Daily 4)  Aspirin Low Dose 81 Mg Tabs (Aspirin) .... Take 1 Tablet By Mouth Once A Day 5)  Caltrate 600+d 600-400 Mg-Unit Tabs (Calcium Carbonate-Vitamin D) .... Take 2 Tabs By Mouth Daily 6)  Nitrostat 0.4 Mg Subl (Nitroglycerin) .Marland Kitchen.. 1 Tablet Under Tongue At Onset of Chest Pain; You May Repeat Every 5 Minutes For Up To 3 Doses. 7)  Coumadin 2.5 Mg Tabs (Warfarin Sodium) .... Take As Directed 8)  Cardizem Cd 240 Mg Xr24h-Cap (Diltiazem Hcl Coated Beads) .... Take One Tablet By Mouth Daily 9)  Multaq 400 Mg Tabs (Dronedarone Hcl) .... Take One Tablet By Mouth Twice Daily With Meals 10)  Zocor 20 Mg Tabs (Simvastatin) .... Take One Tablet By Mouth Daily 11)  Inhaler ....  As Directed  Allergies: No Known Drug Allergies  Past History:  Past Medical History: Reviewed history from 06/05/2009 and no changes required.  TIA (ICD-435.9) HYPOTHYROIDISM (ICD-244.9) HYPERTENSION (ICD-401.9) ALLERGIC RHINITIS (ICD-477.9)    Review of Systems       ROS is negative except as outlined in HPI.   Vital Signs:  Patient profile:   77 year old female Height:      62 inches Weight:      143 pounds Pulse rate:   61 / minute Resp:     14 per minute BP sitting:   150 / 80  (left arm)  Vitals Entered By: Kem Parkinson (July 21, 2009 10:05 AM)  Physical Exam  Additional Exam:  Gen. Well-nourished, in no distress   Neck: No JVD, thyroid not enlarged, right carotid bruits Lungs: No tachypnea, clear without rales, rhonchi or wheezes Cardiovascular: Rhythm regular, PMI not displaced,  heart sounds  normal, no murmurs or gallops, no peripheral edema, I had difficulty feeling pedal pulses Abdomen: BS normal, abdomen soft and non-tender without masses or organomegaly, no hepatosplenomegaly. MS: No deformities, no cyanosis or clubbing   Neuro:  No focal sns   Skin:  no lesions    Impression & Recommendations:  Problem # 1:  ATRIAL FIBRILLATION (ICD-427.31) She is currently in sinus rhythm and is had no symptomatic recurrence since hospital discharge on Multaq. 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 Her  updated medication list for this problem includes:    Metoprolol Tartrate 50 Mg Tabs (Metoprolol tartrate) .Marland Kitchen... Take one tablet by mouth twice a day    Aspirin Low Dose 81 Mg Tabs (Aspirin) .Marland Kitchen... Take 1 tablet by mouth once a day    Coumadin 2.5 Mg Tabs (Warfarin sodium) .Marland Kitchen... Take as directed    Multaq 400 Mg Tabs (Dronedarone hcl) .Marland Kitchen... Take one tablet by mouth twice daily with meals  Her updated medication list for this problem includes:    Metoprolol Tartrate 50 Mg Tabs (Metoprolol tartrate) .Marland Kitchen... Take one tablet by  mouth twice a day    Aspirin Low Dose 81 Mg Tabs (Aspirin) .Marland Kitchen... Take 1 tablet by mouth once a day    Coumadin 2.5 Mg Tabs (Warfarin sodium) .Marland Kitchen... Take as directed    Multaq 400 Mg Tabs (Dronedarone hcl) .Marland Kitchen... Take one tablet by mouth twice daily with meals  Problem # 2:  DYSPNEA (ICD-786.05)  Her updated medication list for this problem includes:    Metoprolol Tartrate 50 Mg Tabs (Metoprolol tartrate) .Marland Kitchen... Take one tablet by mouth twice a day    Aspirin Low Dose 81 Mg Tabs (Aspirin) .Marland Kitchen... Take 1 tablet by mouth once a day    Cardizem Cd 300 Mg Xr24h-cap (Diltiazem hcl coated beads) ..... One tab by mouth once daily  Orders: EKG w/ Interpretation (93000)  Her updated medication list for this problem includes:    Metoprolol Tartrate 50 Mg Tabs (Metoprolol tartrate) .Marland Kitchen... Take one tablet by mouth twice a day    Aspirin Low Dose 81 Mg Tabs (Aspirin) .Marland Kitchen... Take 1 tablet by mouth once a day    Cardizem Cd 240 Mg Xr24h-cap (Diltiazem hcl coated beads) .Marland Kitchen... Take one tablet by mouth daily  Problem # 3:  CAROTID ARTERY STENOSIS, RIGHT (ICD-433.10) She has greater than 80% right carotid stenosis. We will make a referral to vascular surgery. Her updated medication list for this problem includes:    Aspirin Low Dose 81 Mg Tabs (Aspirin) .Marland Kitchen... Take 1 tablet by mouth once a day    Coumadin 2.5 Mg Tabs (Warfarin sodium) .Marland Kitchen... Take as directed  Her updated medication list for this problem includes:    Aspirin Low Dose 81 Mg Tabs (Aspirin) .Marland Kitchen... Take 1 tablet by mouth once a day    Coumadin 2.5 Mg Tabs (Warfarin sodium) .Marland Kitchen... Take as directed  Problem # 4:  PVD (ICD-443.9) She has pain in her calves with walking and has decreased pulses on exam. We will get peripheral arterial Doppler studies to evaluate her for this. This may contribute some to her decreased exercise tolerance.  Other Orders: VVSG Referral (VVSG Ref) Arterial Duplex Lower Extremity (Arterial Duplex Low)  Patient  Instructions: 1)  Your physician recommends that you schedule a follow-up appointment in: 6 weeks. 2)  Your physician has requested that you have a lower extremity arterial duplex.  This test is an ultrasound of the arteries in the legs or arms.  It looks at arterial blood flow in the legs and arms.  Allow one hour for Lower and Upper Arterial scans. There are no restrictions or special instructions. 3)  You are being referred to the Vascular Surgeon for evaluation of carotid stenosis.- Appointment within 2 weeks to Dr. Cari Caraway or first available. 4)  Your physician has recommended you make the following change in your medication: 1) increase Diltiazem to 300mg  once daily  Prescriptions: CARDIZEM CD 300 MG XR24H-CAP (DILTIAZEM HCL COATED BEADS) one tab  by mouth once daily  #30 x 6   Entered by:   Sherri Rad, RN, BSN   Authorized by:   Lenoria Farrier, MD, [facility]   Signed by:   Sherri Rad, RN, BSN on 07/21/2009   Method used:   Electronically to        [facility] [phone]* (retail)       [address]       [place], [place]  [address]       Ph: [phone]       Fax: [phone]   RxID:   [id number]

## 2010-05-31 NOTE — Assessment & Plan Note (Signed)
Summary: rov for dyspnea   Copy to:  Geoffry Paradise Primary Provider/Referring Provider:  Geoffry Paradise  CC:  Pt is here for a 4 week f/u appt.  Pt was seen by MW on 11-17-2009.  Pt states breathing is "much improved"  Pt states she is still taking Protonix bid.  Pt denied cough, sore throat, and nasal congesiton or headaches.  .  History of Present Illness: The pt comes in today for f/u of her doe.  She was recently in the hospital for further evaluation, where no cardiopulmonary pathology was found.  She was seen by Dr. Sherene Sires in my absence, and felt to have reflux.  She has been treated with aggressive two times a day PPI, and comes in today for f/u.  She feels that her breathing has significantly improved, and is now satisfied with her symptoms.  She denies cough or congestion  Current Medications (verified): 1)  Synthroid 50 Mcg Tabs (Levothyroxine Sodium) .... Take 1 Tablet By Mouth Once A Day 2)  Zebeta 5 Mg Tabs (Bisoprolol Fumarate) .... One By Mouth Daily 3)  Aspirin Low Dose 81 Mg Tabs (Aspirin) .... Take 1 Tablet By Mouth Once A Day 4)  Caltrate 600+d 600-400 Mg-Unit Tabs (Calcium Carbonate-Vitamin D) .... Hold 5)  Nitrostat 0.4 Mg Subl (Nitroglycerin) .Marland Kitchen.. 1 Tablet Under Tongue At Onset of Chest Pain; You May Repeat Every 5 Minutes For Up To 3 Doses. 6)  Coumadin 5 Mg Tabs (Warfarin Sodium) .... As Directed 7)  Cartia Xt 240 Mg Xr24h-Cap (Diltiazem Hcl Coated Beads) .... Take One Tab By Mouth Once Daily 8)  Multaq 400 Mg Tabs (Dronedarone Hcl) .... Take One Tablet By Mouth Twice Daily With Meals 9)  Zocor 20 Mg Tabs (Simvastatin) .... Take One Tablet By Mouth Daily 10)  Dulera .... Use As Needed 11)  Furosemide 20 Mg Tabs (Furosemide) .... Take One Tablet By Mouth Daily. 12)  Warfarin Sodium 5 Mg Tabs (Warfarin Sodium) .... Mon-Sat 5 Mg Sun 1/2 Mg 13)  Ambien 5 Mg Tabs (Zolpidem Tartrate) .... One By Mouth At Bedtime Daily 14)  Pantoprazole Sodium 40 Mg Tbec (Pantoprazole  Sodium) .... Twice Daily Before Meals 15)  Ferrex 150 150 Mg Caps (Polysaccharide Iron Complex) .... One Twice A Day  Allergies (verified): No Known Drug Allergies  Review of Systems  The patient denies shortness of breath with activity, shortness of breath at rest, productive cough, non-productive cough, coughing up blood, chest pain, irregular heartbeats, acid heartburn, indigestion, loss of appetite, weight change, abdominal pain, difficulty swallowing, sore throat, tooth/dental problems, headaches, nasal congestion/difficulty breathing through nose, sneezing, itching, ear ache, anxiety, depression, hand/feet swelling, joint stiffness or pain, rash, change in color of mucus, and fever.    Vital Signs:  Patient profile:   77 year old female Height:      62 inches Weight:      137.13 pounds O2 Sat:      96 % on Room air Temp:     98.3 degrees F oral Pulse rate:   60 / minute BP sitting:   124 / 82  (left arm) Cuff size:   regular  Vitals Entered By: Arman Filter LPN (December 14, 2009 10:09 AM)  O2 Flow:  Room air CC: Pt is here for a 4 week f/u appt.  Pt was seen by MW on 11-17-2009.  Pt states breathing is "much improved"  Pt states she is still taking Protonix bid.  Pt denied cough, sore throat, nasal congesiton or  headaches.   Comments Medications reviewed with patient Arman Filter LPN  December 14, 2009 10:09 AM    Physical Exam  General:  wd female in nad Lungs:  clear to auscultation Heart:  rrr, no mrg Extremities:  no edema noted, no cyanosis  Neurologic:  alert and oriented, moves all 4.   Impression & Recommendations:  Problem # 1:  DYSPNEA (ICD-786.05)  The pt has no obvious cardiopulmonary disease from extensive testing, but has improved with treatment of reflux disease.  I have told her this can affect the "perception" of her breathing, and is not causing damage to her lungs (no evidence for aspiration on imaging).  She will followup with me as  needed.  Medications Added to Medication List This Visit: 1)  Dulera  .... Use as needed  Other Orders: Est. Patient Level III ([address])  Patient Instructions: 1)  continue on reflux medication since it has helped your breathing. 2)  will send a note to Dr. Jacky Kindle. 3)  followup with me as needed.

## 2010-05-31 NOTE — Progress Notes (Signed)
Summary: SWOLLEN FINGER  Phone Note Call from Patient Call back at Work Phone [phone]   Caller: Daughter/TONI WRIGHT Summary of Call: PT FINGER SWOLLEN DUE TO PULLING HANG NAIL , AND THE PT DAUGHTER WANT TO TALK TO SOMEONE ABOUT THIS MATTER DUE TO PT TAKING COUUMADIN Initial call taken by: Judie Grieve,  July 25, 2009 1:08 PM  Follow-up for Phone Call        Called spoke with pt's daughter Sheralyn Boatman.  Pt pulled a hang nail yesterday, finger irritated yesterday, but swelling has increased and site looks pusy today.  Advised she should take pt to primary MD to have finger evaluated.  They have cleaned with peroxide and put Neosporin on site.  Pt's daughter has been reading about coumadin and ganegreen/ black fingers and toes.  Advised pt has physically pulled hangnail at this site and it sounds like the swelling and pus is directly r/t.  Agrees to consult primary MD to evaluate.  Pt's coumadin was checked yesterday and INR was 2.1.   Follow-up by: Cloyde Reams RN,  July 25, 2009 3:43 PM

## 2010-05-31 NOTE — Progress Notes (Signed)
  Faxed 12 lead over to [facility] to fax [phone] [facility]  August 03, 2009 11:09 AM

## 2010-05-31 NOTE — Progress Notes (Signed)
Summary: Pt daughter calling regarding mother/ need to be seen today  Phone Note Call from Patient Call back at Home Phone [phone]   Caller: Daughter/ [name] Summary of Call: Pt daughter returning call Initial call taken by: Judie Grieve,  November 16, 2009 10:49 AM  Follow-up for Phone Call        per pt daughter called back. stating her mother need to be seen by dr. Shelle Iron today. advise pt that heather / bb was in clinic today. unaware if dr. Shelle Iron is in the office or not.  [phone] Lorne Skeens  November 16, 2009 11:50 AM  I spoke with the pt's daughter, they are aware of the pt's appt. with Dr. Sherene Sires tomorrow at 2:45pm. Follow-up by: Sherri Rad, RN, BSN,  November 16, 2009 12:34 PM

## 2010-05-31 NOTE — Miscellaneous (Signed)
Summary: Orders Update pft charges  Clinical Lists Changes  Orders: Added new Service order of Carbon Monoxide diffusing w/capacity (94720) - Signed Added new Service order of Lung Volumes (94240) - Signed Added new Service order of Spirometry (Pre & Post) (94060) - Signed 

## 2010-05-31 NOTE — Miscellaneous (Signed)
Summary: Multaq samples  Clinical Lists Changes  Medications: Rx of MULTAQ 400 MG TABS (DRONEDARONE HCL) Take one tablet by mouth twice daily with meals;  #24 x 0;  Signed;  Entered by: Sherri Rad, RN, BSN;  Authorized by: Lenoria Farrier, MD, [facility];  Method used: Samples Given    Prescriptions: MULTAQ 400 MG TABS (DRONEDARONE HCL) Take one tablet by mouth twice daily with meals  #24 x 0   Entered by:   Sherri Rad, RN, BSN   Authorized by:   Lenoria Farrier, MD, [facility]   Signed by:   Sherri Rad, RN, BSN on 08/23/2009   Method used:   Samples Given   RxID:   [id number]

## 2010-05-31 NOTE — Medication Information (Signed)
Summary: rov/tm  Anticoagulant Therapy  Managed by: Bethena Midget, RN, BSN Referring MD: Juanda Chance MD, Bruce PCP: Geoffry Paradise Supervising MD: Clifton James MD, Cristal Deer Indication 1: Atrial Fibrillation Lab Used: [facility] [place] Site: [address] INR POC 1.7 INR RANGE 2.0-3.0  Dietary changes: no    Health status changes: yes       Details: Complains of feeling so very cold and has to wrap up in several blankets. Will discuss this on Tuesday with Dr Jacky Kindle. He did see her in ER last Friday, Hemoglobin was 9  Bleeding/hemorrhagic complications: yes       Details: Multiple bruises pt states from fall, noted on bil. cheeks on face, Rt side of mouth and she states on left thigh and abd.   Recent/future hospitalizations: yes       Details: ER visit last Friday  Any changes in medication regimen? yes       Details: Diovan on Hold until Tuesday when you see Dr Jacky Kindle.   Recent/future dental: no  Any missed doses?: no       Is patient compliant with meds? yes      Comments: Had MRI and CT last Friday s/p fall on that day. No breaks or Fractures.   Allergies: No Known Drug Allergies  Anticoagulation Management History:      The patient is taking warfarin and comes in today for a routine follow up visit.  Positive risk factors for bleeding include an age of 75 years or older and history of CVA/TIA.  The bleeding index is 'intermediate risk'.  Positive CHADS2 values include History of CHF, History of HTN, Age > 75 years old, and Prior Stroke/CVA/TIA.  Her last INR was 1.0 ratio.  Anticoagulation responsible provider: Clifton James MD, Cristal Deer.  INR POC: 1.7.  Cuvette Lot#: [id number].  Exp: 11/2010.    Anticoagulation Management Assessment/Plan:      The patient's current anticoagulation dose is Coumadin 2.5 mg tabs: Take as directed( ON HOLD FOR SURGERY)08/02/2009, Warfarin sodium 5 mg tabs: Use as directed by Anticoagulation Clinic.  The target INR is 2.0-3.0.  The next  INR is due 10/27/2009.  Anticoagulation instructions were given to patient.  Results were reviewed/authorized by Bethena Midget, RN, BSN.  She was notified by Bethena Midget, RN, BSN.         Prior Anticoagulation Instructions: INR 2.2 Continue 2 pills everyday except 1 pill on Sundays. Recheck in 4 weeks. Patient is going to switch over to 5mg  tablet by next visit.  Current Anticoagulation Instructions: INR 1.7 Today take 1 1/2 tablet then resume 1 tablet everyday except on Sundays take 1/2  tablet. Recheck in 3 weeks.

## 2010-05-31 NOTE — Progress Notes (Signed)
Summary: only received 3 multaq was told she will be getting 9  Phone Note Call from Patient Call back at Home Phone [phone]   Caller: Daughter- [name] Reason for Call: Talk to Nurse Summary of Call: per pt dtr called back, only received 3 MULTAQ 400 MG TABS Take one tablet by mouth twice daily with meals. was told pt will be receive 9. pt has appt today with ear md @ 3:30 p.m.  Initial call taken by: Lorne Skeens,  November 15, 2009 1:30 PM  Follow-up for Phone Call        I called and spoke with [name]. She was under the impression that 9 boxes of Multaq were being held for the pt. I explained that only 9 days worth was pulled for the pt. I have pulled 12 more tablets for the pt. Multaq 400mg  samples- lot [id number]/ exp- 11/12. They will come to pick these up today. Follow-up by: Sherri Rad, RN, BSN,  November 15, 2009 2:24 PM

## 2010-05-31 NOTE — Medication Information (Signed)
Summary: rov/eac  Anticoagulant Therapy  Managed by: Bethena Midget, RN, BSN PCP: Geoffry Paradise Supervising MD: Shirlee Latch MD, Kinlee Garrison Indication 1: Atrial Fibrillation INR POC 1.4 INR RANGE 2.0-3.0  Dietary changes: no    Health status changes: no    Bleeding/hemorrhagic complications: no    Recent/future hospitalizations: no    Any changes in medication regimen? no    Recent/future dental: no  Any missed doses?: no       Is patient compliant with meds? yes       Allergies: No Known Drug Allergies  Anticoagulation Management History:      The patient is taking warfarin and comes in today for a routine follow up visit.  Positive risk factors for bleeding include an age of 75 years or older and history of CVA/TIA.  The bleeding index is 'intermediate risk'.  Positive CHADS2 values include History of HTN, Age > 75 years old, and Prior Stroke/CVA/TIA.  Her last INR was 1.0 ratio.  Anticoagulation responsible provider: Shirlee Latch MD, Lisa Milian.  INR POC: 1.4.  Cuvette Lot#: [id number].  Exp: 08/2010.    Anticoagulation Management Assessment/Plan:      The patient's current anticoagulation dose is Coumadin 2.5 mg tabs: Take as directed.  The next INR is due 07/24/2009.  Anticoagulation instructions were given to patient.  Results were reviewed/authorized by Bethena Midget, RN, BSN.  She was notified by Weston Brass, PharmD.         Prior Anticoagulation Instructions: INR 1.5  Take 2 tablets on Monday and Wednesday, and 1 tablet all other days.  Return to clinic in 1 week.    Current Anticoagulation Instructions: INR 1.4  Take 2 tablets today (Monday) and tomorrow (Tuesday) then increase dose to 2 tablets every day except 1 tablet on Sunday, Tuesday, and Thursday

## 2010-05-31 NOTE — Assessment & Plan Note (Signed)
Summary: np6/dx:SOB   Visit Type:  Initial Consult Referring Provider:  Geoffry Paradise Primary Provider:  Geoffry Paradise  CC:  pt has sob .  History of Present Illness: The patient is 77 years old and is referred for evaluation of shortness of breath by Dr. Jacky Kindle and Clance.  Her daughter, Christabella Alvira, is a patient of mine. The patient has been having symptoms of shortness of breath with rather minimal exertion over the past couple of months. She is to walk with her husband briskly but is not able to do that anymore. A week ago she had a prolonged episode of burning pain while she was at home which finally resolved.  She was evaluated by Dr. Jacky Kindle with a CT scan which showed no evidence of pulmonary embolism. She was seen by Dr. Shelle Iron did right function tests which showed minimal disease with a moderately low diffusing capacity. Dr. Shelle Iron was concerned that her symptoms might be ischemic.  She has a markedly positive family history for vascular disease. She has a sister who had a myocardial infarction, a brother who had a microinfarction, and another brother who had heart problems and high blood pressure. Her daughter, who is a patient of mine, has had a prior heart attack. Her mother also had a ruptured aorta.  She also has a history of hypertension but no history of diabetes hyperlipidemia or smoking.  Current Medications (verified): 1)  Synthroid 50 Mcg Tabs (Levothyroxine Sodium) .... Take 1 Tablet By Mouth Once A Day 2)  Plavix 75 Mg Tabs (Clopidogrel Bisulfate) .... Take 1 Tablet By Mouth Once A Day 3)  Diovan Hct 80-12.5 Mg Tabs (Valsartan-Hydrochlorothiazide) .... Take 1 Tablet By Mouth Once A Day 4)  Metoprolol Tartrate 50 Mg Tabs (Metoprolol Tartrate) .... Take One Tablet By Mouth Twice A Day 5)  Naproxen 500 Mg Tabs (Naproxen) .... Take 2 Tabs By Mouth Daily 6)  Aspirin Low Dose 81 Mg Tabs (Aspirin) .... Take 1 Tablet By Mouth Once A Day 7)  Caltrate 600+d 600-400 Mg-Unit  Tabs (Calcium Carbonate-Vitamin D) .... Take 2 Tabs By Mouth Daily  Allergies (verified): No Known Drug Allergies  Past History:  Past Medical History: Reviewed history from 06/05/2009 and no changes required.  TIA (ICD-435.9) HYPOTHYROIDISM (ICD-244.9) HYPERTENSION (ICD-401.9) ALLERGIC RHINITIS (ICD-477.9)    Family History: Reviewed history from 06/05/2009 and no changes required. emphysema: sister heart disease: mother, brother, sister, daughter cancer: sister (lung, adrenal glands), brother (stomach), daughter (breast) Positive family history for CAD and abdominal aneurysm as outlined in the history of present illness  Social History: Reviewed history from 06/05/2009 and no changes required. Patient never smoked.  pt is married. pt has children. pt is retired.  prev worked at a daycare.    Review of Systems       ROS is negative except as outlined in HPI.   Vital Signs:  Patient profile:   77 year old female Height:      62 inches Weight:      139 pounds Pulse rate:   68 / minute BP sitting:   158 / 71  (left arm) Cuff size:   regular  Vitals Entered By: Burnett Kanaris, CNA (June 22, 2009 4:10 PM)  Physical Exam  Additional Exam:  Gen. Well-nourished, in no distress Head: Normocephalic    Eyes: PERRLA/EOM intact; conjunctiva and lids normal Neck: No JVD, thyroid not enlarged, loud right carotid bruit Lungs: No tachypnea, clear w/o rales, rhonchi or wheezes CV: Rhythm regular, PMI not  displaced,  sounds  nl, no murmurs or gallops, no edema, pulses nl UE and LE Abd: BS nl, abd soft and non-tender w/o masses or hepatosplenomegaly MS: No deformities Neuro: no focal sns Skin: no lesions Psych: nl affect    Impression & Recommendations:  Problem # 1:  DYSPNEA (ICD-786.05) I think her dyspnea may well be an anginal equivalent. She had a prolonged episode of chest pain a week ago which also may have been ischemic. She has a very strong risk profile and  she also has a right carotid bruit. We plan to evaluate her further with a palpation catheterization procedure. We'll plan to do a right heart catheter as well because of the decreased diffusing capacity and because shortness of breath is her major symptom. I discussed the procedure with her and her daughter.  we will give her nitroglycerin have available for p.r.n. use.  Her updated medication list for this problem includes:    Diovan Hct 80-12.5 Mg Tabs (Valsartan-hydrochlorothiazide) .Marland Kitchen... Take 1 tablet by mouth once a day    Metoprolol Tartrate 50 Mg Tabs (Metoprolol tartrate) .Marland Kitchen... Take one tablet by mouth twice a day    Aspirin Low Dose 81 Mg Tabs (Aspirin) .Marland Kitchen... Take 1 tablet by mouth once a day  Orders: TLB-BMP (Basic Metabolic Panel-BMET) (80048-METABOL) TLB-CBC Platelet - w/Differential (85025-CBCD) TLB-PT (Protime) (85610-PTP) TLB-PTT (85730-PTTL) EKG w/ Interpretation (93000) Cardiac Catheterization (Cardiac Cath)  Her updated medication list for this problem includes:    Diovan Hct 80-12.5 Mg Tabs (Valsartan-hydrochlorothiazide) .Marland Kitchen... Take 1 tablet by mouth once a day    Metoprolol Tartrate 50 Mg Tabs (Metoprolol tartrate) .Marland Kitchen... Take one tablet by mouth twice a day    Aspirin Low Dose 81 Mg Tabs (Aspirin) .Marland Kitchen... Take 1 tablet by mouth once a day  Her updated medication list for this problem includes:    Diovan Hct 80-12.5 Mg Tabs (Valsartan-hydrochlorothiazide) .Marland Kitchen... Take 1 tablet by mouth once a day    Metoprolol Tartrate 50 Mg Tabs (Metoprolol tartrate) .Marland Kitchen... Take one tablet by mouth twice a day    Aspirin Low Dose 81 Mg Tabs (Aspirin) .Marland Kitchen... Take 1 tablet by mouth once a day  Problem # 2:  HYPERTENSION (ICD-401.9) This appears borderline controlled on current medications. Her updated medication list for this problem includes:    Diovan Hct 80-12.5 Mg Tabs (Valsartan-hydrochlorothiazide) .Marland Kitchen... Take 1 tablet by mouth once a day    Metoprolol Tartrate 50 Mg Tabs (Metoprolol  tartrate) .Marland Kitchen... Take one tablet by mouth twice a day    Aspirin Low Dose 81 Mg Tabs (Aspirin) .Marland Kitchen... Take 1 tablet by mouth once a day  Problem # 3:  CAROTID BRUIT, RIGHT (ICD-785.9) She has a loud right carotid bruit. She will need evaluation for Doppler studies after her catheterization procedure.  Problem # 4:  OTHER SPECIFIED IRON DEFICIENCY ANEMIAS (ICD-280.8) She has an iron deficiency anemia with a hemoglobin of 10.7 an iron of 45 and an iron-binding capacity of 423 with a saturation of 10.6%. This may need evaluation per Dr. Jacky Kindle after we define her cardiac problem and dyspnea.  Patient Instructions: 1)  Your physician recommends that you schedule a follow-up appointment in: 2 weeks 2)  Your physician has recommended you make the following change in your medication: 1) Take Nitroglycerin 0.4mg  sublingual tabs as directed. 3)  Your physician has requested that you have a cardiac catheterization.  Cardiac catheterization is used to diagnose and/or treat various heart conditions. Doctors may recommend this procedure for a  number of different reasons. The most common reason is to evaluate chest pain. Chest pain can be a symptom of coronary artery disease (CAD), and cardiac catheterization can show whether plaque is narrowing or blocking your heart's arteries. This procedure is also used to evaluate the valves, as well as measure the blood flow and oxygen levels in different parts of your heart.  For further information please visit [email or web address]  Please follow instruction sheet, as given. 4)  Your physician recommends that you have lab work today: bmet/cbc/pt/ptt (786.05) Prescriptions: NITROSTAT 0.4 MG SUBL (NITROGLYCERIN) 1 tablet under tongue at onset of chest pain; you may repeat every 5 minutes for up to 3 doses.  #25 x 3   Entered by:   Sherri Rad, RN, BSN   Authorized by:   Lenoria Farrier, MD, [facility]   Signed by:   Sherri Rad, RN, BSN on 06/22/2009   Method used:    Electronically to        [facility] [phone]* (retail)       [address]       [place], [place]  [address]       Ph: [phone] or [phone]       Fax: [phone]   RxID:   [id number]

## 2010-05-31 NOTE — Medication Information (Signed)
Summary: rov/tm  Anticoagulant Therapy  Managed by: Reina Fuse, PharmD Referring MD: Juanda Chance MD, Bruce PCP: Geoffry Paradise Supervising MD: Clifton James MD, Cristal Deer Indication 1: Atrial Fibrillation Lab Used: [facility] [place] Site: [address] INR POC 3.3 INR RANGE 2.0-3.0  Dietary changes: no    Health status changes: no    Bleeding/hemorrhagic complications: no    Recent/future hospitalizations: no    Any changes in medication regimen? no    Recent/future dental: no  Any missed doses?: no       Is patient compliant with meds? yes       Allergies: No Known Drug Allergies  Anticoagulation Management History:      The patient is taking warfarin and comes in today for a routine follow up visit.  Positive risk factors for bleeding include an age of 75 years or older, history of CVA/TIA, and presence of serious comorbidities.  The bleeding index is 'high risk'.  Positive CHADS2 values include History of CHF, History of HTN, Age > 38 years old, and Prior Stroke/CVA/TIA.  Her last INR was 1.0 ratio.  Anticoagulation responsible provider: Clifton James MD, Cristal Deer.  INR POC: 3.3.  Cuvette Lot#: [id number].  Exp: 01/2011.    Anticoagulation Management Assessment/Plan:      The patient's current anticoagulation dose is Coumadin 5 mg tabs: as directed, Warfarin sodium 5 mg tabs: mon-sat 5 mg sun 1/2 mg.  The target INR is 2.0-3.0.  The next INR is due 01/09/2010.  Anticoagulation instructions were given to patient.  Results were reviewed/authorized by Reina Fuse, PharmD.  She was notified by Reina Fuse PharmD.         Prior Anticoagulation Instructions: INR 2.5 Continue 5mg s everyday except 2.5mg s on Sundays. Recheck in 4 weeks. Recheck in 4 weeks.   Current Anticoagulation Instructions: INR 3.3  Today, Tuesday, August 23rd, do not take Coumadin.  Then, take Coumadin 1 tab (5 mg) on all days except Coumadin 0.5 tab (2.5 mg) on Sundays.  Return to clinic in 3  weeks.

## 2010-05-31 NOTE — Medication Information (Signed)
Summary: rov/tm  Anticoagulant Therapy  Managed by: Weston Brass, PharmD Referring MD: Juanda Chance MD, Smitty Cords PCP: Geoffry Paradise Supervising MD: Gala Romney MD, Reuel Boom Indication 1: Atrial Fibrillation Lab Used: [facility] [place] Site: [address] INR POC 2.7 INR RANGE 2.0-3.0  Dietary changes: no    Health status changes: no    Bleeding/hemorrhagic complications: no    Recent/future hospitalizations: no    Any changes in medication regimen? yes       Details: on cephalexin for UTI.  Has a few days left.  Started on iron for Hgb  Recent/future dental: no  Any missed doses?: no       Is patient compliant with meds? yes       Allergies: No Known Drug Allergies  Anticoagulation Management History:      The patient is taking warfarin and comes in today for a routine follow up visit.  Positive risk factors for bleeding include an age of 75 years or older and history of CVA/TIA.  The bleeding index is 'intermediate risk'.  Positive CHADS2 values include History of CHF, History of HTN, Age > 75 years old, and Prior Stroke/CVA/TIA.  Her last INR was 1.0 ratio.  Anticoagulation responsible provider: Nakai Yard MD, Reuel Boom.  INR POC: 2.7.  Cuvette Lot#: [id number].  Exp: 12/2010.    Anticoagulation Management Assessment/Plan:      The patient's current anticoagulation dose is Coumadin 2.5 mg tabs: Take as directed( ON HOLD FOR SURGERY)08/02/2009, Warfarin sodium 5 mg tabs: Use as directed by Anticoagulation Clinic.  The target INR is 2.0-3.0.  The next INR is due 11/21/2009.  Anticoagulation instructions were given to patient.  Results were reviewed/authorized by Weston Brass, PharmD.  She was notified by Weston Brass PharmD.         Prior Anticoagulation Instructions: INR 1.7 Today take 1 1/2 tablet then resume 1 tablet everyday except on Sundays take 1/2  tablet. Recheck in 3 weeks.   Current Anticoagulation Instructions: INR 2.7  Continue same dose of 1 tablet every day  except 1/2 tablet on Sunday.

## 2010-05-31 NOTE — Assessment & Plan Note (Signed)
Summary: consult for dyspnea   Visit Type:  Initial Consult Copy to:  [name] Primary Provider/Referring Provider:  Geoffry Hammond  CC:  Pulmonary Consult.  History of Present Illness: The pt is an 77y/o female who I have been asked to see for dyspnea.  The pt felt she had normal exertional tolerance last summer, but began to have doe starting in the fall.  This has subsequently progressed, and now has gotten to the point where it can occasional occur at rest.  Her sob is now interfering with her ADL's.  She describes a one block doe at a moderate pace on flat ground, and can get winded bringing groceries in from the car.  She denies any cough, congestion, or mucus production.  She has no h/o asthma, and has never smoked.  Her weight has increased by 10 pounds since a year ago.  She has had bloodwork which shows normal TSH, no signficant anemia.  She has had a ct chest without obvious PE, numerous cysts/bullae (?senile emphysema), posterior and dependent interstitial changes that I suspect are due to increased perfusion due to gravity, and a solitary 6mm nodule in RLL.  Preventive Screening-Counseling & Management  Alcohol-Tobacco     Smoking Status: never  Current Medications (verified): 1)  Synthroid 50 Mcg Tabs (Levothyroxine Sodium) .... Take 1 Tablet By Mouth Once A Day 2)  Plavix 75 Mg Tabs (Clopidogrel Bisulfate) .... Take 1 Tablet By Mouth Once A Day 3)  Diovan Hct 80-12.5 Mg Tabs (Valsartan-Hydrochlorothiazide) .... Take 1 Tablet By Mouth Once A Day 4)  Toprol Xl 100 Mg Xr24h-Tab (Metoprolol Succinate) .... Take 1 Tablet By Mouth Once A Day 5)  Naproxen 500 Mg Tabs (Naproxen) .... Take 2 Tabs By Mouth Daily 6)  Aspirin Low Dose 81 Mg Tabs (Aspirin) .... Take 1 Tablet By Mouth Once A Day 7)  Caltrate 600+d 600-400 Mg-Unit Tabs (Calcium Carbonate-Vitamin D) .... Take 2 Tabs By Mouth Daily  Allergies (verified): No Known Drug Allergies  Past History:  Past Medical  History:  TIA (ICD-435.9) HYPOTHYROIDISM (ICD-244.9) HYPERTENSION (ICD-401.9) ALLERGIC RHINITIS (ICD-477.9)    Past Surgical History: tubal ligation approx 1970s.  Family History: Reviewed history and no changes required. emphysema: sister heart disease: mother, brother, sister, daughter cancer: sister (lung, adrenal glands), brother (stomach), daughter (breast)  Social History: Reviewed history and no changes required. Patient never smoked.  pt is married. pt has children. pt is retired.  prev worked at a daycare.  Smoking Status:  never  Review of Systems       The patient complains of shortness of breath with activity, shortness of breath at rest, weight change, sore throat, sneezing, and joint stiffness or pain.  The patient denies productive cough, non-productive cough, coughing up blood, chest pain, irregular heartbeats, acid heartburn, indigestion, loss of appetite, abdominal pain, difficulty swallowing, tooth/dental problems, headaches, nasal congestion/difficulty breathing through nose, itching, ear ache, anxiety, depression, hand/feet swelling, rash, change in color of mucus, and fever.    Vital Signs:  Patient profile:   77 year old female Height:      61.5 inches Weight:      140.38 pounds BMI:     26.19 O2 Sat:      98 % on Room air Temp:     98.0 degrees F oral Pulse rate:   72 / minute BP sitting:   172 / 90  (right arm) Cuff size:   regular  Vitals Entered By: Arman Filter LPN (June 05, 2009 2:39 PM)  O2 Flow:  Room air  CC: Pulmonary Consult Comments Medications reviewed with patient Arman Filter LPN  June 05, 2009 2:48 PM    Physical Exam  General:  wd female in nad Eyes:  PERRLA and EOMI.   Nose:  patent without discharge Mouth:  clear  Neck:  no jvd, tmg, LN Lungs:  totally clear to auscultation Heart:  rrr, no mrg Abdomen:  soft and nontender, bs+ Extremities:  no significant edema noted, no cyanosis pulses intact  distally Neurologic:  alert and oriented, moves all 4.   Impression & Recommendations:  Problem # 1:  DYSPNEA (ICD-786.05)  the pt has doe that has been progressive from last year, and now is significantly interfering with her QOL.  There is nothing by her history or exam to suggest an obvious pulmonary issue, but her chest ct does show emphysematous cysts/blebs that may be due to senile emphysema.  She will need full pfts to see if she has underlying airflow obstruction.  She also had very mild interstitial changes that were purely posterior and dependent in nature, and therefore more likely due to gravity dependent hyper- perfusion rather than true interstitial disease.  If her pfts are unremarkable, would consider doing echo and stress test to look for cardiac sources of dyspnea.  Problem # 2:  PULMONARY NODULE (ICD-518.89) the pt has a 6mm pulmonary nodule noted in RLL on ct chest.  She is a never smoker, and therefore puts her into a low risk category.  Would make sure that her health maintenance is up to date, and do f/u at 6-37mos.  If no change, would do one more study one year after that.  Medications Added to Medication List This Visit: 1)  Synthroid 50 Mcg Tabs (Levothyroxine sodium) .... Take 1 tablet by mouth once a day 2)  Plavix 75 Mg Tabs (Clopidogrel bisulfate) .... Take 1 tablet by mouth once a day 3)  Diovan Hct 80-12.5 Mg Tabs (Valsartan-hydrochlorothiazide) .... Take 1 tablet by mouth once a day 4)  Toprol Xl 100 Mg Xr24h-tab (Metoprolol succinate) .... Take 1 tablet by mouth once a day 5)  Naproxen 500 Mg Tabs (Naproxen) .... Take 2 tabs by mouth daily 6)  Aspirin Low Dose 81 Mg Tabs (Aspirin) .... Take 1 tablet by mouth once a day 7)  Caltrate 600+d 600-400 Mg-unit Tabs (Calcium carbonate-vitamin d) .... Take 2 tabs by mouth daily  Other Orders: Consultation Level IV (16109) Pulmonary Referral (Pulmonary)  Patient Instructions: 1)  will schedule for pulmonary  function studies, and see you back on same day to review with you.

## 2010-05-31 NOTE — Progress Notes (Signed)
Summary: samples  Phone Note Call from Patient Call back at Work Phone [phone] Call back at [phone]   Caller: Daughter Reason for Call: Talk to Nurse Summary of Call: request samples of multag 400mg  Initial call taken by: Migdalia Dk,  Sep 14, 2009 1:39 PM  Follow-up for Phone Call        samples at front desk for pt to pickup lot # [id number] exp 02/2011 #18 Meredith Staggers, RN  Sep 14, 2009 2:10 PM     Prescriptions: MULTAQ 400 MG TABS (DRONEDARONE HCL) Take one tablet by mouth twice daily with meals  #18 x 0   Entered by:   Meredith Staggers, RN   Authorized by:   Lenoria Farrier, MD, [facility]   Signed by:   Meredith Staggers, RN on 09/14/2009   Method used:   Samples Given   RxID:   [id number]

## 2010-05-31 NOTE — Assessment & Plan Note (Signed)
Summary: rov after pft ///kp   Copy to:  Geoffry Paradise Primary Provider/Referring Provider:  Geoffry Paradise  CC:  Pt is here for a f/u appt to discuss PFT results.  .  History of Present Illness: The pt comes in today for f/u of her recent pfts as part of a w/u for unexplained dyspnea.  She was found to have supranormal airflows with no obstruction, no restriction, and a mild decrease in dlco.  I have reviewed pfts with her in detail, and answered all of her questions.  Medications Prior to Update: 1)  Synthroid 50 Mcg Tabs (Levothyroxine Sodium) .... Take 1 Tablet By Mouth Once A Day 2)  Plavix 75 Mg Tabs (Clopidogrel Bisulfate) .... Take 1 Tablet By Mouth Once A Day 3)  Diovan Hct 80-12.5 Mg Tabs (Valsartan-Hydrochlorothiazide) .... Take 1 Tablet By Mouth Once A Day 4)  Toprol Xl 100 Mg Xr24h-Tab (Metoprolol Succinate) .... Take 1 Tablet By Mouth Once A Day 5)  Naproxen 500 Mg Tabs (Naproxen) .... Take 2 Tabs By Mouth Daily 6)  Aspirin Low Dose 81 Mg Tabs (Aspirin) .... Take 1 Tablet By Mouth Once A Day 7)  Caltrate 600+d 600-400 Mg-Unit Tabs (Calcium Carbonate-Vitamin D) .... Take 2 Tabs By Mouth Daily  Allergies (verified): No Known Drug Allergies  Vital Signs:  Patient profile:   77 year old female Height:      62 inches Weight:      142 pounds BMI:     26.07 O2 Sat:      96 % on Room air Temp:     97.8 degrees F oral Pulse rate:   66 / minute BP sitting:   138 / 72  (left arm) Cuff size:   regular  Vitals Entered By: Arman Filter LPN (June 12, 2009 1:45 PM)  O2 Flow:  Room air CC: Pt is here for a f/u appt to discuss PFT results.   Comments Medications reviewed with patient  Arman Filter LPN  June 12, 2009 1:45 PM    Physical Exam  General:  wd female in nad   Impression & Recommendations:  Problem # 1:  DYSPNEA (ICD-786.05)  the pt has no obstruction or restriction on her pfts today.  She does have a mild decrease in dlco, and when taken in  context of normal spirometry and lung volumes, may be indicative of an occult cardiac issue.  She has an apptm scheduled with Dr. Juanda Chance in a few weeks.  I see no obvious pulmonary issue to explain her doe.  If her cardiac w/u is unremarkable as well, would consider doing cpst for dyspnea of unknown origin.  I spent today discussing pft results and further suggestions with patient  Other Orders: Est. Patient Level III ([address])  Patient Instructions: 1)  no further pulmonary evaluation at this time. 2)  keep apptm with Dr. Juanda Chance for cardiac evaluation.   Immunization History:  Influenza Immunization History:    Influenza:  historical (01/27/2009)

## 2010-05-31 NOTE — Cardiovascular Report (Signed)
Summary: Pre Cath Orders  Pre Cath Orders   Imported By: Roderic Ovens 07/10/2009 11:38:57  _____________________________________________________________________  External Attachment:    Type:   Image     Comment:   External Document

## 2010-05-31 NOTE — Miscellaneous (Signed)
Summary: med change  Clinical Lists Changes  Medications: Changed medication from DIOVAN HCT 80-12.5 MG TABS (VALSARTAN-HYDROCHLOROTHIAZIDE) Take 1 tablet by mouth once a day to DIOVAN HCT 80-12.5 MG TABS (VALSARTAN-HYDROCHLOROTHIAZIDE) Take 1/2  tablet by mouth once a day Added new medication of AMLODIPINE BESYLATE 5 MG TABS (AMLODIPINE BESYLATE) Take one tablet by mouth daily - Signed Rx of AMLODIPINE BESYLATE 5 MG TABS (AMLODIPINE BESYLATE) Take one tablet by mouth daily;  #30 x 6;  Signed;  Entered by: Sherri Rad, RN, BSN;  Authorized by: Lenoria Farrier, MD, [facility];  Method used: [facility] to [facility]. [phone]*, [address] [place], [place]  [address], Ph: [phone] or [phone], Fax: [phone]    Prescriptions: AMLODIPINE BESYLATE 5 MG TABS (AMLODIPINE BESYLATE) Take one tablet by mouth daily  #30 x 6   Entered by:   Sherri Rad, RN, BSN   Authorized by:   Lenoria Farrier, MD, [facility]   Signed by:   Sherri Rad, RN, BSN on 06/23/2009   Method used:   [facility]ronically to        [facility]. [phone]* (retail)       [address]       [place], [place]  [address]       Ph: [phone] or [phone]       Fax: [phone]   RxID:   [id number]

## 2010-05-31 NOTE — Progress Notes (Signed)
Summary: holter monitor  Phone Note Outgoing Call   Action Taken: Appt scheduled Summary of Call: call pt to sch. appt for 7-28/11 at 12:30 talk to daughter Initial call taken by: Marcos Eke,  November 21, 2009 4:04 PM

## 2010-05-31 NOTE — Medication Information (Signed)
Summary: [name]  Anticoagulant Therapy  Managed by: Eda Keys, PharmD PCP: Geoffry Paradise Supervising MD: Tenny Craw MD, Gunnar Fusi Indication 1: Atrial Fibrillation INR POC 1.5 INR RANGE 2.0-3.0  Dietary changes: no    Health status changes: yes       Details: Pt continues to have sob  Bleeding/hemorrhagic complications: yes    Recent/future hospitalizations: no    Any changes in medication regimen? no    Recent/future dental: no  Any missed doses?: no       Is patient compliant with meds? yes       Allergies: No Known Drug Allergies  Anticoagulation Management History:      The patient is taking warfarin and comes in today for a routine follow up visit.  Positive risk factors for bleeding include an age of 75 years or older and history of CVA/TIA.  The bleeding index is 'intermediate risk'.  Positive CHADS2 values include History of HTN, Age > 75 years old, and Prior Stroke/CVA/TIA.  Her last INR was 1.0 ratio.  Anticoagulation responsible provider: Tenny Craw MD, Gunnar Fusi.  INR POC: 1.5.  Exp: 08/2010.    Anticoagulation Management Assessment/Plan:      The patient's current anticoagulation dose is Coumadin 2.5 mg tabs: Take as directed.  The next INR is due 07/17/2009.  Results were reviewed/authorized by Eda Keys, PharmD.  She was notified by Eda Keys.         Prior Anticoagulation Instructions: INR 1.8   Take 2 tablets Monday's, then take 1 tablet all other days of the week.  Recheck 1 week  Current Anticoagulation Instructions: INR 1.5  Take 2 tablets on Monday and Wednesday, and 1 tablet all other days.  Return to clinic in 1 week.

## 2010-05-31 NOTE — Medication Information (Signed)
Summary: new to coumadin  Anticoagulant Therapy  Managed by: Rolland Porter, PharmD PCP: Geoffry Paradise Supervising MD: Ladona Ridgel MD, Sharlot Gowda Indication 1: Atrial Fibrillation INR POC 1.8 INR RANGE 2.0-3.0  Dietary changes: no    Health status changes: no    Bleeding/hemorrhagic complications: no    Recent/future hospitalizations: no    Any changes in medication regimen? yes    Recent/future dental: no  Any missed doses?: no       Is patient compliant with meds? yes       Current Medications (verified): 1)  Synthroid 50 Mcg Tabs (Levothyroxine Sodium) .... Take 1 Tablet By Mouth Once A Day 2)  Metoprolol Tartrate 50 Mg Tabs (Metoprolol Tartrate) .... Take One Tablet By Mouth Twice A Day 3)  Naproxen 500 Mg Tabs (Naproxen) .... Take 1 Tabs By Mouth Daily 4)  Aspirin Low Dose 81 Mg Tabs (Aspirin) .... Take 1 Tablet By Mouth Once A Day 5)  Caltrate 600+d 600-400 Mg-Unit Tabs (Calcium Carbonate-Vitamin D) .... Take 2 Tabs By Mouth Daily 6)  Nitrostat 0.4 Mg Subl (Nitroglycerin) .Marland Kitchen.. 1 Tablet Under Tongue At Onset of Chest Pain; You May Repeat Every 5 Minutes For Up To 3 Doses. 7)  Coumadin 2.5 Mg Tabs (Warfarin Sodium) .... Take As Directed 8)  Cardizem Cd 240 Mg Xr24h-Cap (Diltiazem Hcl Coated Beads) .... Take One Tablet By Mouth Daily 9)  Multaq 400 Mg Tabs (Dronedarone Hcl) .... Take One Tablet By Mouth Twice Daily With Meals 10)  Zocor 20 Mg Tabs (Simvastatin) .... Take One Tablet By Mouth Daily  Allergies (verified): No Known Drug Allergies  Anticoagulation Management History:      Positive risk factors for bleeding include an age of 20 years or older and history of CVA/TIA.  The bleeding index is 'intermediate risk'.  Positive CHADS2 values include History of HTN, Age > 31 years old, and Prior Stroke/CVA/TIA.  Her last INR was 1.0 ratio.  Anticoagulation responsible provider: Ladona Ridgel MD, Sharlot Gowda.  INR POC: 1.8.  Cuvette Lot#: [id number].  Exp: 08/2010.    Anticoagulation  Management Assessment/Plan:      The patient's current anticoagulation dose is Coumadin 2.5 mg tabs: Take as directed.  The next INR is due 07/10/2009.  Results were reviewed/authorized by Rolland Porter, PharmD.  She was notified by Rolland Porter.         Current Anticoagulation Instructions: INR 1.8   Take 2 tablets Monday's, then take 1 tablet all other days of the week.  Recheck 1 week

## 2010-05-31 NOTE — Medication Information (Signed)
Summary: rov/sp  Anticoagulant Therapy  Managed by: Bethena Midget, RN, BSN PCP: Geoffry Paradise Supervising MD: Clifton James MD, Cristal Deer Indication 1: Atrial Fibrillation Lab Used: [facility] [place] Site: [address] INR POC 2.1 INR RANGE 2.0-3.0  Dietary changes: no    Health status changes: no    Bleeding/hemorrhagic complications: no    Recent/future hospitalizations: no    Any changes in medication regimen? yes       Details: Cardizem dose was increased and Norvasc added.   Recent/future dental: no  Any missed doses?: no       Is patient compliant with meds? yes       Current Medications (verified): 1)  Synthroid 50 Mcg Tabs (Levothyroxine Sodium) .... Take 1 Tablet By Mouth Once A Day 2)  Metoprolol Tartrate 50 Mg Tabs (Metoprolol Tartrate) .... Take One Tablet By Mouth Twice A Day 3)  Naproxen 500 Mg Tabs (Naproxen) .... Take 1 Tabs By Mouth Daily 4)  Aspirin Low Dose 81 Mg Tabs (Aspirin) .... Take 1 Tablet By Mouth Once A Day 5)  Caltrate 600+d 600-400 Mg-Unit Tabs (Calcium Carbonate-Vitamin D) .... Take 2 Tabs By Mouth Daily 6)  Nitrostat 0.4 Mg Subl (Nitroglycerin) .Marland Kitchen.. 1 Tablet Under Tongue At Onset of Chest Pain; You May Repeat Every 5 Minutes For Up To 3 Doses. 7)  Coumadin 2.5 Mg Tabs (Warfarin Sodium) .... Take As Directed 8)  Cardizem Cd 300 Mg Xr24h-Cap (Diltiazem Hcl Coated Beads) .... One Tab By Mouth Once Daily 9)  Multaq 400 Mg Tabs (Dronedarone Hcl) .... Take One Tablet By Mouth Twice Daily With Meals 10)  Zocor 20 Mg Tabs (Simvastatin) .... Take One Tablet By Mouth Daily 11)  Inhaler .... As Directed 12)  Amlodipine Besylate 5 Mg Tabs (Amlodipine Besylate) .... Take One Tablet By Mouth Daily  Allergies (verified): No Known Drug Allergies  Anticoagulation Management History:      The patient is taking warfarin and comes in today for a routine follow up visit.  Positive risk factors for bleeding include an age of 57 years or older  and history of CVA/TIA.  The bleeding index is 'intermediate risk'.  Positive CHADS2 values include History of HTN, Age > 59 years old, and Prior Stroke/CVA/TIA.  Her last INR was 1.0 ratio.  Anticoagulation responsible provider: Clifton James MD, Cristal Deer.  INR POC: 2.1.  Cuvette Lot#: [id number].  Exp: 08/2010.    Anticoagulation Management Assessment/Plan:      The patient's current anticoagulation dose is Coumadin 2.5 mg tabs: Take as directed.  The next INR is due 07/31/2009.  Anticoagulation instructions were given to patient.  Results were reviewed/authorized by Bethena Midget, RN, BSN.  She was notified by Bethena Midget, RN, BSN.         Prior Anticoagulation Instructions: INR 1.4  Take 2 tablets today (Monday) and tomorrow (Tuesday) then increase dose to 2 tablets every day except 1 tablet on Sunday, Tuesday, and Thursday   Current Anticoagulation Instructions: INR 2.1 Change dose to 2 pills everyday except 1 pill on Sundays and Thursdays. Recheck in one week.

## 2010-05-31 NOTE — Letter (Signed)
Summary: Cardiac Catheterization Instructions- [facility]  [facility], Main Office  [address]   [place], [place] [address]   Phone: [phone]  Fax: [phone]     06/22/2009 MRN: [id number]  [facility] [address] [place], [place]  [address]  Dear Ms. Kenedy,   You are scheduled for a Cardiac Catheterization on Monday 06/26/09 with Dr.Brodie  Please arrive to the 1st floor of the Heart and Vascular Center at [facility] at _____ am / pm on the day of your procedure. Please do not arrive before 6:30 a.m. Call the Heart and Vascular Center at [phone] if you are unable to make your appointmnet. The Code to get into the parking garage under the building is 0900. Take the elevators to the 1st floor. You must have someone to drive you home. Someone must be with you for the first 24 hours after you arrive home. Please wear clothes that are easy to get on and off and wear slip-on shoes. Do not eat or drink after midnight except water with your medications that morning. Bring all your medications and current insurance cards with you.  ___ DO NOT take these medications before your procedure: ________________________________________________________________  _x__ Make sure you take your aspirin.  _x__ You may take ALL of your medications with water that morning. ________________________________________________________________________________________________________________________________  ___ DO NOT take ANY medications before your procedure.  ___ Pre-med instructions:  ________________________________________________________________________________________________________________________________  The usual length of stay after your procedure is 2 to 3 hours. This can vary.  If you have any questions, please call the office at the number listed above.   Sherri Rad, RN, BSN  Appended Document: Cardiac Catheterization Instructions- [facility] The  pt's procedure will be at 11:30am on 2/28- pt to arrive at 10:30am.

## 2010-05-31 NOTE — Medication Information (Signed)
Summary: rov/tm  Anticoagulant Therapy  Managed by: Elaina Pattee, PharmD PCP: Geoffry Paradise Supervising MD: Riley Kill MD, Maisie Fus Indication 1: Atrial Fibrillation Lab Used: [facility] [place] Site: [address] INR POC 1.9 INR RANGE 2.0-3.0  Dietary changes: no    Health status changes: no    Bleeding/hemorrhagic complications: no    Recent/future hospitalizations: yes       Details: Patient is having surgery on carotid artery Friday morning (Dr. Hart Rochester).    Any changes in medication regimen? yes       Details: Patient is on antibiotic for UTI.  She will call us back with name of medication.  Recent/future dental: no  Any missed doses?: yes     Details: Dr. Hart Rochester instructed patient not to take Coumadin last night.  Is patient compliant with meds? yes      Comments: Per Dr. Juanda Chance, patient will be off Coumadin for up to 5 days without bridging for procedure.  She will resume Coumadin at same dose prior to procedure and follow-up with [place] 08/11/09.  Allergies: No Known Drug Allergies  Anticoagulation Management History:      The patient is taking warfarin and comes in today for a routine follow up visit.  Positive risk factors for bleeding include an age of 75 years or older and history of CVA/TIA.  The bleeding index is 'intermediate risk'.  Positive CHADS2 values include History of HTN, Age > 75 years old, and Prior Stroke/CVA/TIA.  Her last INR was 1.0 ratio.  Anticoagulation responsible provider: Riley Kill MD, Maisie Fus.  INR POC: 1.9.  Cuvette Lot#: [id number].  Exp: 08/2010.    Anticoagulation Management Assessment/Plan:      The patient's current anticoagulation dose is Coumadin 2.5 mg tabs: Take as directed.  The next INR is due 08/11/2009.  Anticoagulation instructions were given to patient.  Results were reviewed/authorized by Elaina Pattee, PharmD.  She was notified by Elaina Pattee, PharmD.         Prior Anticoagulation Instructions: INR 2.1 Change dose to  2 pills everyday except 1 pill on Sundays and Thursdays. Recheck in one week.   Current Anticoagulation Instructions: INR 1.9. Take 2 tablets daily except 1 tablet Thurs and Sun.  If restarted after surgery, restart this dose and return to clinic next Friday for follow-up.  Appended Document: Coumadin Clinic    Anticoagulant Therapy  Managed by: Elaina Pattee, PharmD PCP: Geoffry Paradise Supervising MD: Riley Kill MD, Maisie Fus Indication 1: Atrial Fibrillation Lab Used: [facility] [place] Site: [address] INR RANGE 2.0-3.0      Any changes in medication regimen? yes       Details: Patient is taking Cipro 500 mg daily for UTI per daughter.  Pt off Coumadin until Sat for procedure.      Allergies: No Known Drug Allergies  Anticoagulation Management History:      Positive risk factors for bleeding include an age of 75 years or older and history of CVA/TIA.  The bleeding index is 'intermediate risk'.  Positive CHADS2 values include History of CHF, History of HTN, Age > 75 years old, and Prior Stroke/CVA/TIA.  Her last INR was 1.0 ratio.  Anticoagulation responsible provider: Riley Kill MD, Maisie Fus.  Exp: 08/2010.    Anticoagulation Management Assessment/Plan:      The patient's current anticoagulation dose is Coumadin 2.5 mg tabs: Take as directed( ON HOLD FOR SURGERY)08/02/2009.  The next INR is due 08/11/2009.  Anticoagulation instructions were given to patient.  Results were  reviewed/authorized by Elaina Pattee, PharmD.         Prior Anticoagulation Instructions: INR 1.9. Take 2 tablets daily except 1 tablet Thurs and Sun.  If restarted after surgery, restart this dose and return to clinic next Friday for follow-up.

## 2010-05-31 NOTE — Progress Notes (Signed)
Summary: medication question  Phone Note Call from Patient Call back at Work Phone [phone] Call back at [employer]# [phone]   Caller: Daughter/[name] Reason for Call: Talk to Nurse, Talk to Doctor Summary of Call: pt was on reg  metaprolol and it was changed to  time released and since she still has a full bottle of the reg can she finish that out and then go back to the time released Initial call taken by: Omer Jack,  Sep 26, 2009 1:50 PM  Follow-up for Phone Call        per pt instructions from 09-08-09--OK to take Metoprolol  Tartrate 50mg  one-half twice a day until the current supply has been used then change to Metoprolol Succinate 50mg  daily--I discussed with daughter [name] by telephone

## 2010-05-31 NOTE — Letter (Signed)
Summary: Custom - Delinquent Coumadin 1  [facility], Main Office  [address]   [place], [place] [address]   Phone: [phone]  Fax: [phone]     May 23, 2010 MRN: [id number]   [name] [address] [place], [place]  [address]   Dear [name],  This letter is being sent to you as a reminder that it is necessary for you to get your INR/PT checked regularly so that we can optimize your care.  Our records indicate that you were scheduled to have a test done recently.  As of today, we have not received the results of this test.  It is very important that you have your INR checked.  Please call our office at the number listed above to schedule an appointment at your earliest convenience.    If you have recently had your protime checked or have discontinued this medication, please contact our office at the above phone number to clarify this issue.  Thank you for this prompt attention to this important health care matter.  Sincerely,   [facility] Cardiovascular Risk Reduction [facility] Team    Appended Document: Custom - Delinquent Coumadin 1 Letter not mailed.  Pt's daughter called to report INR being followed by Dr. Jacky Kindle prior to letter being sent.

## 2010-05-31 NOTE — Medication Information (Signed)
Summary: rov/sl  Anticoagulant Therapy  Managed by: Bethena Midget, RN, BSN Referring MD: Juanda Chance MD, Bruce PCP: Geoffry Paradise Supervising MD: Daleen Squibb MD, Maisie Fus Indication 1: Atrial Fibrillation Lab Used: [facility] [place] Site: [address] INR POC 2.1 INR RANGE 2.0-3.0  Dietary changes: no    Health status changes: no    Bleeding/hemorrhagic complications: no    Recent/future hospitalizations: no    Any changes in medication regimen? yes       Details: Saw Dr Juanda Chance today, he stopped Multaq and changed lasix to PRN  Recent/future dental: no  Any missed doses?: no       Is patient compliant with meds? yes       Allergies: No Known Drug Allergies  Anticoagulation Management History:      The patient is taking warfarin and comes in today for a routine follow up visit.  Positive risk factors for bleeding include an age of 75 years or older, history of CVA/TIA, and presence of serious comorbidities.  The bleeding index is 'high risk'.  Positive CHADS2 values include History of CHF, History of HTN, Age > 62 years old, and Prior Stroke/CVA/TIA.  Her last INR was 1.0 ratio.  Anticoagulation responsible provider: Daleen Squibb MD, Maisie Fus.  INR POC: 2.1.  Cuvette Lot#: [id number].  Exp: 02/2011.    Anticoagulation Management Assessment/Plan:      The patient's current anticoagulation dose is Coumadin 5 mg tabs: as directed, Warfarin sodium 5 mg tabs: mon-sat 5 mg sun 1/2 mg.  The target INR is 2.0-3.0.  The next INR is due 02/15/2010.  Anticoagulation instructions were given to patient.  Results were reviewed/authorized by Bethena Midget, RN, BSN.  She was notified by Bethena Midget, RN, BSN.         Prior Anticoagulation Instructions: INR 3.3  Today, Tuesday, August 23rd, do not take Coumadin.  Then, take Coumadin 1 tab (5 mg) on all days except Coumadin 0.5 tab (2.5 mg) on Sundays.  Return to clinic in 3 weeks.   Current Anticoagulation Instructions: INR 2.1 Continue 5mg s  everyday except 2.5mg s on Sundays. Recheck in 4 weeks.

## 2010-05-31 NOTE — Medication Information (Signed)
Summary: ccr  Anticoagulant Therapy  Managed by: Bethena Midget, RN, BSN Referring MD: Juanda Chance MD, Bruce PCP: Geoffry Paradise Supervising MD: Antoine Poche MD, Fayrene Fearing Indication 1: Atrial Fibrillation Lab Used: [facility] [place] Site: [address] INR POC 2.2 INR RANGE 2.0-3.0  Dietary changes: no    Health status changes: no    Bleeding/hemorrhagic complications: no    Recent/future hospitalizations: no    Any changes in medication regimen? no    Recent/future dental: no  Any missed doses?: no       Is patient compliant with meds? yes      Comments: Seeing Dr. Juanda Chance today.  Allergies: No Known Drug Allergies  Anticoagulation Management History:      The patient is taking warfarin and comes in today for a routine follow up visit.  Positive risk factors for bleeding include an age of 75 years or older and history of CVA/TIA.  The bleeding index is 'intermediate risk'.  Positive CHADS2 values include History of CHF, History of HTN, Age > 29 years old, and Prior Stroke/CVA/TIA.  Her last INR was 1.0 ratio.  Anticoagulation responsible provider: Antoine Poche MD, Fayrene Fearing.  INR POC: 2.2.  Cuvette Lot#: [id number].  Exp: 11/2010.    Anticoagulation Management Assessment/Plan:      The patient's current anticoagulation dose is Coumadin 2.5 mg tabs: Take as directed( ON HOLD FOR SURGERY)08/02/2009, Warfarin sodium 5 mg tabs: Use as directed by Anticoagulation Clinic.  The target INR is 2.0-3.0.  The next INR is due 10/06/2009.  Anticoagulation instructions were given to patient.  Results were reviewed/authorized by Bethena Midget, RN, BSN.  She was notified by Bethena Midget, RN, BSN.         Prior Anticoagulation Instructions: INR 2.4 Continue 2 pills everyday except 1 pill on Sundays. Recheck in 2 week.   Current Anticoagulation Instructions: INR 2.2 Continue 2 pills everyday except 1 pill on Sundays. Recheck in 4 weeks. Patient is going to switch over to 5mg tablet by next  visit. Prescriptions: WARFARIN SODIUM 5 MG TABS (WARFARIN SODIUM) Use as directed by Anticoagulation Clinic  #90 x 3   Entered by:   Tiffany Muse, RN, BSN   Authorized by:   Bruce Rogers Brodie, MD, FACC   Signed by:   Tiffany Muse, RN, BSN on 09/08/2009   Method used:   [facility]  [address] [id number]       [address]       [place], [place]  [address]       Ph: [phone] or [phone]       Fax: [phone]   RxID:   [id number]

## 2010-05-31 NOTE — Progress Notes (Signed)
Summary: Pt need samples of Multaq  Phone Note Call from Patient Call back at Home Phone [phone]   Caller: Daughter/ [name] Summary of Call: Pt daughter calling to get samples of Multaq Initial call taken by: Judie Grieve,  Sep 26, 2009 3:02 PM  Follow-up for Phone Call        09/27/09--1445--samples of multaq--400mg  x24 tabs given to pt's daughter--nt Follow-up by: Ledon Snare, RN,  September 27, 2009 2:51 PM

## 2010-05-31 NOTE — Assessment & Plan Note (Signed)
Summary: SOB  Nurse Visit   Vital Signs:  Patient profile:   77 year old female O2 Sat:      100 % on Room air Pulse rate:   70 / minute Pulse rhythm:   regular BP sitting:   158 / 68  (left arm)  Vitals Entered By: Sherri Rad, RN, BSN (November 16, 2009 2:51 PM)  O2 Flow:  Room air  Referring Provider:  Geoffry Paradise Primary Provider:  Geoffry Paradise   History of Present Illness: (late entry)- the patient was in our office building yesterday seeing Dr. Dorma Russell for a hearing test. Dr. Dorma Russell' nurse brought the pt. up to our office because she became very SOB. Per the RN with Dr. Dorma Russell, the pt developed SOB while sitting in the lobby for her appt. She developed some tingling around the mouth as well. I reviewed the pt's d/c summary from hospital on 11/12/09. The pt was recently admitted with similar symptoms with essestially negative workups by cardiology, pulmonary, and GI. The pt was placed on a 4 week trial of a PPI for possible upper airway cough syndrome complicated by reflux and a hiatal hernia. I asked the pt if her symptoms in our office were similar to the symptoms that took her to the hospital. She stated they were similar. The pt. did seem to have some anxiety in the office as well. She does voice frustration about her situation. I reviewed the pt with the DOD, Dr. Shirlee Latch, he thought the pt. should get in with pulmonary this week. I have also reviewed this with Dr. Juanda Chance. He feels the pt. does need pulmonary f/u. I have gotten her scheduled to see Dr. Sherene Sires tomorrow at 2:45pm and her daughter, [name], is aware.    Allergies: No Known Drug Allergies  Past History:  Past Medical History: Last updated: 06/05/2009  TIA (ICD-435.9) HYPOTHYROIDISM (ICD-244.9) HYPERTENSION (ICD-401.9) ALLERGIC RHINITIS (ICD-477.9)

## 2010-05-31 NOTE — Assessment & Plan Note (Signed)
Summary: f79m   Referring Provider:  Geoffry Paradise Primary Provider:  Geoffry Paradise  CC:  fatigue.Marland Kitchen  History of Present Illness: The patient is 77 years old today and returns for management of paroxysmal atrial fibrillation and diastolic heart failure. She was initially evaluated for shortness of breath and had a cardiac catheterization which showed nonobstructive CAD. She was thought to have mild diastolic CHF. She developed paroxysmal atrial fibrillation and was managed with Cardizem, Coumadin, and Multaq.  She was recently put on double dose proton makes for treatment of reflux thought to be contributing to her shortness of breath and her shortness of breath greatly improved. She is being followed by Dr. Shelle Iron for this.  other problems include recent carotid endarterectomy, hypertension, and anemia.  Current Medications (verified): 1)  Synthroid 50 Mcg Tabs (Levothyroxine Sodium) .... Take 1 Tablet By Mouth Once A Day 2)  Zebeta 5 Mg Tabs (Bisoprolol Fumarate) .... One By Mouth Daily 3)  Aspirin Low Dose 81 Mg Tabs (Aspirin) .... Take 1 Tablet By Mouth Once A Day 4)  Caltrate 600+d 600-400 Mg-Unit Tabs (Calcium Carbonate-Vitamin D) .... Hold 5)  Nitrostat 0.4 Mg Subl (Nitroglycerin) .Marland Kitchen.. 1 Tablet Under Tongue At Onset of Chest Pain; You May Repeat Every 5 Minutes For Up To 3 Doses. 6)  Coumadin 5 Mg Tabs (Warfarin Sodium) .... As Directed 7)  Cartia Xt 240 Mg Xr24h-Cap (Diltiazem Hcl Coated Beads) .... Take One Tab By Mouth Once Daily 8)  Multaq 400 Mg Tabs (Dronedarone Hcl) .... Take One Tablet By Mouth Twice Daily With Meals 9)  Zocor 20 Mg Tabs (Simvastatin) .... Take One Tablet By Mouth Daily 10)  Dulera .... Use As Needed 11)  Furosemide 20 Mg Tabs (Furosemide) .... Take One Tablet By Mouth Daily. 12)  Ambien 5 Mg Tabs (Zolpidem Tartrate) .... One By Mouth At Bedtime Daily 13)  Pantoprazole Sodium 40 Mg Tbec (Pantoprazole Sodium) .... Twice Daily Before Meals 14)  Ferrex 150  150 Mg Caps (Polysaccharide Iron Complex) .... One Twice A Day  Allergies: No Known Drug Allergies  Past History:  Past Surgical History: Last updated: 06/05/2009 tubal ligation approx 1970s.  Family History: Last updated: 06/22/2009 emphysema: sister heart disease: mother, brother, sister, daughter cancer: sister (lung, adrenal glands), brother (stomach), daughter (breast) Positive family history for CAD and abdominal aneurysm as outlined in the history of present illness  Social History: Last updated: 06/05/2009 Patient never smoked.  pt is married. pt has children. pt is retired.  prev worked at a daycare.    Past Medical History: Reviewed history from 11/17/2009 and no changes required. Hiatal Hernia     - Pos EGD  11/11/09 TIA (ICD-435.9) HYPOTHYROIDISM (ICD-244.9) HYPERTENSION (ICD-401.9) ALLERGIC RHINITIS (ICD-477.9)  Atrial Arrythmia Unexplained sob onset Fall 2010      - PFT's nl  06/12/09      - R Ht Cath 06/26/09 nl PA but Wedge 23 with non obst CAD, echo same day:    - Left ventricle: The cavity size was normal. Wall thickness was     increased in a pattern of mild LVH. Systolic function was     vigorous. The estimated ejection fraction was in the range of 65%     to 70%.   - Left atrium: The atrium was mildly dilated.      - Nl walking sats November 17, 2009   Review of Systems       ROS is negative except as outlined in HPI.   Vital  Signs:  Patient profile:   77 year old female Height:      62 inches Weight:      142 pounds BMI:     26.07 Pulse rate:   56 / minute Resp:     14 per minute BP sitting:   144 / 65  (left arm)  Vitals Entered By: Kem Parkinson (January 16, 2010 10:14 AM)  Physical Exam  Additional Exam:  Gen. Well-nourished, in no distress   Neck: No JVD, thyroid not enlarged, no carotid bruits, carotid endarterectomy scar Lungs: No tachypnea, clear without rales, rhonchi or wheezes Cardiovascular: Rhythm regular, PMI not  displaced,  heart sounds  normal, no murmurs or gallops, no peripheral edema, pulses normal in all 4 extremities. Abdomen: BS normal, abdomen soft and non-tender without masses or organomegaly, no hepatosplenomegaly. MS: No deformities, no cyanosis or clubbing   Neuro:  No focal sns   Skin:  no lesions    Impression & Recommendations:  Problem # 1:  ATRIAL FIBRILLATION (ICD-427.31) She has had no symptomatic recurrences of atrial fibrillation. Because of expense and because of recent reports of adverse events, we will discontinue the Multaq. We will continue Cardizem CD. She is [place] score of 4 so her risk of stroke is moderately high at 4-5% per year.  I encouraged continued Coumadin usage. Her cardiac problems are stable and we will turn her followup back to Dr. Jacky Kindle. We will be available for any recurrent problems with atrial fibrillation or any other recurrent cardiac problems.  The following medications were removed from the medication list:    Multaq 400 Mg Tabs (Dronedarone hcl) .Marland Kitchen... Take one tablet by mouth twice daily with meals    Warfarin Sodium 5 Mg Tabs (Warfarin sodium) ..... Mon-sat 5 mg sun 1/2 mg Her updated medication list for this problem includes:    Zebeta 5 Mg Tabs (Bisoprolol fumarate) ..... One by mouth daily    Aspirin Low Dose 81 Mg Tabs (Aspirin) .Marland Kitchen... Take 1 tablet by mouth once a day    Coumadin 5 Mg Tabs (Warfarin sodium) .Marland Kitchen... As directed  The following medications were removed from the medication list:    Warfarin Sodium 5 Mg Tabs (Warfarin sodium) ..... Mon-sat 5 mg sun 1/2 mg Her updated medication list for this problem includes:    Zebeta 5 Mg Tabs (Bisoprolol fumarate) ..... One by mouth daily    Aspirin Low Dose 81 Mg Tabs (Aspirin) .Marland Kitchen... Take 1 tablet by mouth once a day    Coumadin 5 Mg Tabs (Warfarin sodium) .Marland Kitchen... As directed    Multaq 400 Mg Tabs (Dronedarone hcl) .Marland Kitchen... Take one tablet by mouth twice daily with meals  Problem # 2:   DIASTOLIC HEART FAILURE, CHRONIC (ICD-428.32) She is euvolemic today. She would like to get off some of her medications. We will make her Lasix p.r.n. The following medications were removed from the medication list:    Multaq 400 Mg Tabs (Dronedarone hcl) .Marland Kitchen... Take one tablet by mouth twice daily with meals    Warfarin Sodium 5 Mg Tabs (Warfarin sodium) ..... Mon-sat 5 mg sun 1/2 mg Her updated medication list for this problem includes:    Zebeta 5 Mg Tabs (Bisoprolol fumarate) ..... One by mouth daily    Aspirin Low Dose 81 Mg Tabs (Aspirin) .Marland Kitchen... Take 1 tablet by mouth once a day    Nitrostat 0.4 Mg Subl (Nitroglycerin) .Marland Kitchen... 1 tablet under tongue at onset of chest pain; you may repeat every 5  minutes for up to 3 doses.    Coumadin 5 Mg Tabs (Warfarin sodium) .Marland Kitchen... As directed    Cartia Xt 240 Mg Xr24h-cap (Diltiazem hcl coated beads) .Marland Kitchen... Take one tab by mouth once daily    Furosemide 20 Mg Tabs (Furosemide) .Marland Kitchen... Take one tablet by mouth daily as needed  Problem # 3:  DYSPNEA (ICD-786.05) Her dyspnea has greatly improved after more intensive treatment of reflux with double dose Protonix. We will continue current therapy. Her updated medication list for this problem includes:    Zebeta 5 Mg Tabs (Bisoprolol fumarate) ..... One by mouth daily    Aspirin Low Dose 81 Mg Tabs (Aspirin) .Marland Kitchen... Take 1 tablet by mouth once a day    Cartia Xt 240 Mg Xr24h-cap (Diltiazem hcl coated beads) .Marland Kitchen... Take one tab by mouth once daily    Furosemide 20 Mg Tabs (Furosemide) .Marland Kitchen... Take one tablet by mouth daily as needed  Patient Instructions: 1)  Stop Multaq. 2)  Change lasix to 20mg  once daily as needed. 3)  We will see you back on an as needed basis.

## 2010-05-31 NOTE — Assessment & Plan Note (Signed)
Summary: f6w   Visit Type:  Follow-up Referring Provider:  Geoffry Paradise Primary Provider:  Geoffry Paradise  CC:  SOB.  History of Present Illness: The patient is 77 years old and return for management of atrial fibrillation and shortness of breath. We originally saw her in consultation for evaluation of shortness of breath. She underwent catheterization and was found to have nonobstructive CAD. She developed paroxysmal fibrillation during the catheterization procedure and has been managed with multi-and Coumadin. She apparently had a recurrence at the time of her angiography for her carotid arteries.  She also has had a history of diastolic CHF which has been managed with diuretics.  She recently had carotid endarterectomy for a tight right carotid stenosis performed by Dr. Hart Rochester.  She has had no recent chest pain and no palpitations since her episode at the time of her cerebral angiogram. She does still have shortness of breath with exertion.  Her other problems include anemia, hypertension, and peripheral vascular disease.  Current Medications (verified): 1)  Synthroid 50 Mcg Tabs (Levothyroxine Sodium) .... Take 1 Tablet By Mouth Once A Day 2)  Metoprolol Tartrate 50 Mg Tabs (Metoprolol Tartrate) .... Take One Tablet By Mouth Twice A Day 3)  Naproxen 500 Mg Tabs (Naproxen) .... Take 1 Tabs By Mouth Daily 4)  Aspirin Low Dose 81 Mg Tabs (Aspirin) .... Take 1 Tablet By Mouth Once A Day 5)  Caltrate 600+d 600-400 Mg-Unit Tabs (Calcium Carbonate-Vitamin D) .... Hold 6)  Nitrostat 0.4 Mg Subl (Nitroglycerin) .Marland Kitchen.. 1 Tablet Under Tongue At Onset of Chest Pain; You May Repeat Every 5 Minutes For Up To 3 Doses. 7)  Coumadin 2.5 Mg Tabs (Warfarin Sodium) .... Take As Directed( On Hold For Surgery)08/02/2009 8)  Cartia Xt 240 Mg Xr24h-Cap (Diltiazem Hcl Coated Beads) .... Take One Tab By Mouth Once Daily 9)  Multaq 400 Mg Tabs (Dronedarone Hcl) .... Take One Tablet By Mouth Twice Daily With  Meals 10)  Zocor 20 Mg Tabs (Simvastatin) .... Take One Tablet By Mouth Daily 11)  Inhaler .... As Directed 12)  Nasal Spray .... Prn 13)  Furosemide 20 Mg Tabs (Furosemide) .... Take One Tablet By Mouth Daily. 14)  Diovan Hct 80-12.5 Mg Tabs (Valsartan-Hydrochlorothiazide) .... Take 1/2  Tab By Mouth Once Daily 15)  Warfarin Sodium 5 Mg Tabs (Warfarin Sodium) .... Use As Directed By Anticoagulation Clinic 16)  Ambien .... Qhs  Allergies (verified): No Known Drug Allergies  Past History:  Past Medical History: Reviewed history from 06/05/2009 and no changes required.  TIA (ICD-435.9) HYPOTHYROIDISM (ICD-244.9) HYPERTENSION (ICD-401.9) ALLERGIC RHINITIS (ICD-477.9)    Review of Systems       ROS is negative except as outlined in HPI.   Vital Signs:  Patient profile:   77 year old female Height:      62 inches Weight:      142 pounds BMI:     26.07 Pulse rate:   54 / minute BP sitting:   125 / 68  (left arm)  Vitals Entered By: Burnett Kanaris, CNA (Sep 08, 2009 11:46 AM)  Physical Exam  Additional Exam:  Gen. Well-nourished, in no distress   Neck: No JVD, thyroid not enlarged, no carotid bruits, B. right carotid endarterectomy scar was well healed Lungs: No tachypnea, clear without rales, rhonchi or wheezes Cardiovascular: Rhythm regular, PMI not displaced,  heart sounds  normal, no murmurs or gallops, no peripheral edema, pulses normal in all 4 extremities. Abdomen: BS normal, abdomen soft and non-tender without  masses or organomegaly, no hepatosplenomegaly. MS: No deformities, no cyanosis or clubbing   Neuro:  No focal sns   Skin:  no lesions    Impression & Recommendations:  Problem # 1:  ATRIAL FIBRILLATION (ICD-427.31) She had one episode of recurrence apparently during her diagnostic cerebral angiogram. She's had no recurrences since that time. She is currently being managed with Coumadin and Multaq. She is anxious to get off some of her medicines and cost may  be an issue when she runs out of samples and we may consider trying her off of Multaq in the future. Her updated medication list for this problem includes:    Metoprolol Succinate 50 Mg Xr24h-tab (Metoprolol succinate) .Marland Kitchen... Take one tablet by mouth daily    Aspirin Low Dose 81 Mg Tabs (Aspirin) .Marland Kitchen... Take 1 tablet by mouth once a day    Coumadin 2.5 Mg Tabs (Warfarin sodium) .Marland Kitchen... Take as directed( on hold for surgery)08/02/2009    Multaq 400 Mg Tabs (Dronedarone hcl) .Marland Kitchen... Take one tablet by mouth twice daily with meals    Warfarin Sodium 5 Mg Tabs (Warfarin sodium) ..... Use as directed by anticoagulation clinic  Problem # 2:  HYPERTENSION (ICD-401.9) This appears well controlled on current medications. Her updated medication list for this problem includes:    Metoprolol Succinate 50 Mg Xr24h-tab (Metoprolol succinate) .Marland Kitchen... Take one tablet by mouth daily    Aspirin Low Dose 81 Mg Tabs (Aspirin) .Marland Kitchen... Take 1 tablet by mouth once a day    Cartia Xt 240 Mg Xr24h-cap (Diltiazem hcl coated beads) .Marland Kitchen... Take one tab by mouth once daily    Furosemide 20 Mg Tabs (Furosemide) .Marland Kitchen... Take one tablet by mouth daily.    Diovan Hct 80-12.5 Mg Tabs (Valsartan-hydrochlorothiazide) .Marland Kitchen... Take 1/2  tab by mouth once daily  Problem # 3:  DIASTOLIC HEART FAILURE, CHRONIC (ICD-428.32) She appears euvolemic today. This problem appears controlled.  She was anxious to get off some of her medicines and went he went over all of her medicines in detail. Her heart rate is a little slow and she is feeling a bit sluggish so I think we could cut back on the metoprolol. We switch her to long-acting metoprolol 50 mg XR daily.   Her updated medication list for this problem includes:    Metoprolol Succinate 50 Mg Xr24h-tab (Metoprolol succinate) .Marland Kitchen... Take one tablet by mouth daily    Aspirin Low Dose 81 Mg Tabs (Aspirin) .Marland Kitchen... Take 1 tablet by mouth once a day    Nitrostat 0.4 Mg Subl (Nitroglycerin) .Marland Kitchen... 1 tablet  under tongue at onset of chest pain; you may repeat every 5 minutes for up to 3 doses.    Coumadin 2.5 Mg Tabs (Warfarin sodium) .Marland Kitchen... Take as directed( on hold for surgery)08/02/2009    Cartia Xt 240 Mg Xr24h-cap (Diltiazem hcl coated beads) .Marland Kitchen... Take one tab by mouth once daily    Multaq 400 Mg Tabs (Dronedarone hcl) .Marland Kitchen... Take one tablet by mouth twice daily with meals    Furosemide 20 Mg Tabs (Furosemide) .Marland Kitchen... Take one tablet by mouth daily.    Diovan Hct 80-12.5 Mg Tabs (Valsartan-hydrochlorothiazide) .Marland Kitchen... Take 1/2  tab by mouth once daily    Warfarin Sodium 5 Mg Tabs (Warfarin sodium) ..... Use as directed by anticoagulation clinic  Patient Instructions: 1)  Your physician recommends that you schedule a follow-up appointment in: 4 months. 2)  Your physician has recommended you make the following change in your medication: - take metoprolol  tartrate 50mg  1/2 tab two times a day until your pills are gone, then start metoprolol succ 50mg  once daily.  Prescriptions: METOPROLOL SUCCINATE 50 MG XR24H-TAB (METOPROLOL SUCCINATE) Take one tablet by mouth daily  #30 x 6   Entered by:   Sherri Rad, RN, BSN   Authorized by:   Lenoria Farrier, MD, [facility]   Signed by:   Sherri Rad, RN, BSN on 09/08/2009   Method used:   Electronically to        [facility] [phone]* (retail)       [address]       [place], [place]  [address]       Ph: [phone]       Fax: [phone]   RxID:   [id number]

## 2010-05-31 NOTE — Progress Notes (Signed)
Summary: keep appt?  Phone Note Call from Patient   Caller: Daughter Call For: clance Summary of Call: pt was d/c'd from hosp yesterday 7/18. she already had an appt (rov) scheduled for 8/2 w/ kc. should she keep this appt or rsc for later date? daughter [name] [phone] Initial call taken by: Tivis Ringer, CNA,  November 14, 2009 10:02 AM  Follow-up for Phone Call        attempted to call pts daughter Linda but line is busy--will try back later [name] CMA  November 14, 2009 10:06 AM   called and spoke with Linda---pts daughter and she is aware to keep appt with [facility] on 8-2 [name] [facility]  November 14, 2009 10:23 AM

## 2010-05-31 NOTE — Assessment & Plan Note (Signed)
Summary: Pulmonary/ ext ov post hosp with walking sats = nl   Copy to:  Geoffry Paradise Primary Provider/Referring Provider:  Geoffry Paradise  CC:  pt complains of some chest tightness, weakness, gasps for bretah, dry cough, inhaler helps the cough, and SOb with exertion and at rest.  History of Present Illness: 58 yowf with unexplained doe x Fall 2010, indolent onset, waxes and wanes, somewhat progressive.  November 17, 2009 w/u at [facility] much worse sob to point of episodes at rest dx  with anemia and no desaturation walking.  rec trial of ppi x one months but not taking ac as recommended.  Started on Fe. Pt denies any significant sore throat, dysphagia, itching, sneezing,  nasal congestion or excess secretions,  fever, chills, sweats, unintended wt loss, pleuritic or exertional cp, hempoptysis, change in activity tolerance  orthopnea pnd or leg swelling Pt also denies any obvious fluctuation in symptoms with weather or environmental change or other alleviating or aggravating factors.       Preventive Screening-Counseling & Management  Alcohol-Tobacco     Smoking Status: never  Allergies (verified): No Known Drug Allergies  Past History:  Past Medical History: Hiatal Hernia     - Pos EGD  11/11/09 TIA (ICD-435.9) HYPOTHYROIDISM (ICD-244.9) HYPERTENSION (ICD-401.9) ALLERGIC RHINITIS (ICD-477.9)  Atrial Arrythmia Unexplained sob onset Fall 2010      - PFT's nl  06/12/09      - R Ht Cath 06/26/09 nl PA but Wedge 23 with non obst CAD, echo same day:    - Left ventricle: The cavity size was normal. Wall thickness was     increased in a pattern of mild LVH. Systolic function was     vigorous. The estimated ejection fraction was in the range of 65%     to 70%.   - Left atrium: The atrium was mildly dilated.      - Nl walking sats November 17, 2009   Vital Signs:  Patient profile:   77 year old female Height:      62 inches Weight:      117.2 pounds O2 Sat:      95 % on Room air Temp:      98.5 degrees F oral Pulse rate:   83 / minute BP sitting:   148 / 78  (right arm) Cuff size:   regular  Vitals Entered By: Carver Fila (November 17, 2009 3:23 PM)  O2 Flow:  Room air  Serial Vital Signs/Assessments:  Comments: 3:57 PM Ambulatory Pulse Oximetry  Resting; HR__62___    02 Sat__100%ra___  Lap1 (185 feet)   HR__77___   02 Sat__100%ra___ Lap2 (185 feet)   HR__88___   02 Sat__97%ra___    Lap3 (185 feet)   HR_88____   02 Sat__98%ra___  _x__Test Completed without Difficulty ___Test Stopped due to:   By: Vernie Murders   CC: pt complains of some chest tightness, weakness, gasps for bretah, dry cough, inhaler helps the cough, SOb with exertion and at rest Comments meds and allergies updated Carver Fila  November 17, 2009 3:24 PM    Physical Exam  Additional Exam:  wt 142 > 117 November 18, 2009 pleasant chronically ill wf nad HEENT: nl dentition, turbinates, and orophanx. Nl external ear canals without cough reflex NECK :  without JVD/Nodes/TM/ nl carotid upstrokes bilaterally LUNGS: no acc muscle use, clear to A and P bilaterally without cough on insp or exp maneuvers CV:  RRR  no s3 or murmur or increase in P2,  no edema  ABD:  soft and nontender with nl excursion in the supine position. No bruits or organomegaly, bowel sounds nl MS:  warm without deformities, calf tenderness, cyanosis or clubbing SKIN: warm and dry without lesions        Impression & Recommendations:  Problem # 1:  DYSPNEA (ICD-786.05) PCWP 23 on 06/26/09 is the only objective evidence so far of a significant measurable physilologic abnormality thus far and is likely the result of diastolic dysfunction rx with beta blockers which may reduce her CO with ex and result in more fatigue than dyspnea and certainly will tolerate anemia poorly in this setting.  We have not excludede vcd from lpr occult and rec complete one month of empiric ppi two times a day and diet but then stop if not helping. NB the  ramp  to expected improvement (and for that matter, worsening, if a chronic effective medication is stopped)  can be measured in weeks, not days, a common misconception because this is not Heartburn with no immediate cause and effect relationship so that response to therapy or lack thereof can be very difficult to assess.   Problem # 2:  DIASTOLIC HEART FAILURE, CHRONIC (ICD-428.32)  The following medications were removed from the medication list:    Diovan Hct 80-12.5 Mg Tabs (Valsartan-hydrochlorothiazide) .Marland Kitchen... Take 1/2  tab by mouth once daily Her updated medication list for this problem includes:    Zebeta 5 Mg Tabs (Bisoprolol fumarate) ..... One by mouth daily    Aspirin Low Dose 81 Mg Tabs (Aspirin) .Marland Kitchen... Take 1 tablet by mouth once a day    Coumadin 5 Mg Tabs (Warfarin sodium) .Marland Kitchen... As directed    Furosemide 20 Mg Tabs (Furosemide) .Marland Kitchen... Take one tablet by mouth daily.    Warfarin Sodium 5 Mg Tabs (Warfarin sodium) ..... Mon-sat 5 mg sun 1/2 mg  F/u cards  Orders: Est. Patient Level IV (21308)  Problem # 3:  ANEMIA (ICD-285.9)  Her updated medication list for this problem includes:    Ferrex 150 150 Mg Caps (Polysaccharide iron complex) ..... One twice a day  F/u Dr Jacky Kindle  Orders: Est. Patient Level IV (65784)  Problem # 4:  WEIGHT LOSS (ICD-783.21)  TSH nl 11/09/09  Suggests possible underlying malignancy if our numbers are accurate, f/u with Dr Jacky Kindle planned  Orders: Est. Patient Level IV (69629)  Medications Added to Medication List This Visit: 1)  Zebeta 5 Mg Tabs (Bisoprolol fumarate) .... One by mouth daily 2)  Coumadin 5 Mg Tabs (Warfarin sodium) .... As directed 3)  Warfarin Sodium 5 Mg Tabs (Warfarin sodium) .... Mon-sat 5 mg sun 1/2 mg 4)  Ambien 5 Mg Tabs (Zolpidem tartrate) .... One by mouth at bedtime daily 5)  Pantoprazole Sodium 40 Mg Tbec (Pantoprazole sodium) .... Twice daily before meals 6)  Ferrex 150 150 Mg Caps (Polysaccharide iron complex) ....  One twice a day  Other Orders: Pulse Oximetry, Ambulatory (52841) Misc. Referral (Misc. Ref) TLB-CBC Platelet - w/Differential (85025-CBCD)  Patient Instructions: 1)  Protonix 40 mg Take one 30-60 min before first and last meals of the day  2)  See Patient Care Coordinator before leaving for holter monitor 3)  GERD (REFLUX)  is a common cause of respiratory symptoms. It commonly presents without heartburn and can be treated with medication, but also with lifestyle changes including avoidance of late meals, excessive alcohol, smoking cessation, and avoid fatty foods, chocolate, peppermint, colas, red wine, and acidic juices such as orange juice.  NO MINT OR MENTHOL PRODUCTS SO NO COUGH DROPS  4)  USE SUGARLESS CANDY INSTEAD (jolley ranchers)  5)  NO OIL BASED VITAMINS  6)  Please schedule a follow-up appointment in 4  weeks, sooner if needed

## 2010-05-31 NOTE — Medication Information (Signed)
Summary: Coumadin Clinic  Anticoagulant Therapy  Managed by: Inactive Referring MD: Juanda Chance MD, Smitty Cords PCP: Geoffry Paradise Supervising MD: Daleen Squibb MD, Maisie Fus Indication 1: Atrial Fibrillation Lab Used: [facility] [place] Site: [address] INR RANGE 2.0-3.0          Comments: Pt followed by D.r Geoffry Paradise  Allergies: No Known Drug Allergies  Anticoagulation Management History:      Positive risk factors for bleeding include an age of 75 years or older, history of CVA/TIA, and presence of serious comorbidities.  The bleeding index is 'high risk'.  Positive CHADS2 values include History of CHF, History of HTN, Age > 75 years old old, and Prior Stroke/CVA/TIA.  Her last INR was 1.0 ratio.  Anticoagulation responsible provider: Daleen Squibb MD, Maisie Fus.  Exp: 02/2011.    Anticoagulation Management Assessment/Plan:      The patient's current anticoagulation dose is Coumadin 5 mg tabs: as directed.  The target INR is 2.0-3.0.  The next INR is due 02/15/2010.  Anticoagulation instructions were given to patient.  Results were reviewed/authorized by Inactive.         Prior Anticoagulation Instructions: INR 2.1 Continue 5mg s everyday except 2.5mg s on Sundays. Recheck in 4 weeks.

## 2010-05-31 NOTE — Progress Notes (Signed)
Summary: clarify strenght pt on   Phone Note From Pharmacy Message from:  Pharmacy on August 01, 2009 4:41 PM  Refills Requested: Medication #1:  CARDIZEM CD 300 MG XR24H-CAP one tab by mouth once daily Caller: raisa from [place] [facility] [phone]  Request: Speak with Nurse Summary of Call: pls clarify strenght pt on .  Initial call taken by: Lorne Skeens,  August 01, 2009 4:42 PM  Follow-up for Phone Call        08/01/09--pharmacy calling about dose of cardiazem--dr cooper prescribed 240mg  and dr Juanda Chance followed with 300mg  --which one?--after checking in computer dr Paulene Tayag's is last doseage change sowe'll put him on 300mg  24 XL-1 tab once daily--nt Follow-up by: Ledon Snare, RN,  August 01, 2009 5:09 PM

## 2010-05-31 NOTE — Progress Notes (Signed)
Summary: requesting samples  Phone Note Call from Patient   Caller: Daughter Reason for Call: Talk to Nurse Summary of Call: dtr calling to see if we have samples of multaq-raynell [phone] Initial call taken by: Glynda Jaeger,  November 08, 2009 9:43 AM  Follow-up for Phone Call        9 days of Multaq samples placed at the front desk for pick-up.  Pt's daughter aware.  Follow-up by: Julieta Gutting, RN, BSN,  November 08, 2009 10:24 AM

## 2010-05-31 NOTE — Progress Notes (Signed)
       Additional Follow-up for Phone Call Additional follow up Details #2::    i gave pt samples of multaq when she came in for her lab work Follow-up by: Kem Parkinson,  October 24, 2009 9:40 AM

## 2010-05-31 NOTE — Progress Notes (Signed)
Summary: do we have samples  Phone Note Call from Patient Call back at [phone]   Caller: Daughter / [name] Suits Reason for Call: Talk to Nurse, Talk to Doctor Summary of Call: daughter has questions regarding medication. do we have any more samples of multaq (dronedarone) 400mg  tab Initial call taken by: Omer Jack,  August 07, 2009 10:31 AM  Follow-up for Phone Call        I spoke with the pt's daughter and made her aware I have placed samples at the front desk for the pt. Follow-up by: Sherri Rad, RN, BSN,  August 08, 2009 2:35 PM    New/Updated Medications: MULTAQ 400 MG TABS (DRONEDARONE HCL) Take one tablet by mouth twice daily with meals Prescriptions: MULTAQ 400 MG TABS (DRONEDARONE HCL) Take one tablet by mouth twice daily with meals  #36 x 0   Entered by:   Sherri Rad, RN, BSN   Authorized by:   Lenoria Farrier, MD, [facility]   Signed by:   Sherri Rad, RN, BSN on 08/08/2009   Method used:   Samples Given   RxID:   [id number]

## 2010-05-31 NOTE — Procedures (Signed)
Summary: summary report  summary report   Imported By: Mirna Mires 11/27/2009 14:50:52  _____________________________________________________________________  External Attachment:    Type:   Image     Comment:   External Document  Appended Document: summary report Please Call patient  :study  is normal, no change in therapy needed.   copy to Dr Jacky Kindle   Appended Document: summary report Spoke with pt's daughter [name] and notified of results/recs per MW.

## 2010-06-04 ENCOUNTER — Ambulatory Visit: Payer: MEDICARE | Admitting: Gynecology

## 2010-06-04 ENCOUNTER — Other Ambulatory Visit: Payer: Self-pay | Admitting: Gynecology

## 2010-06-04 DIAGNOSIS — N938 Other specified abnormal uterine and vaginal bleeding: Secondary | ICD-10-CM

## 2010-06-04 DIAGNOSIS — N390 Urinary tract infection, site not specified: Secondary | ICD-10-CM

## 2010-06-04 DIAGNOSIS — N949 Unspecified condition associated with female genital organs and menstrual cycle: Secondary | ICD-10-CM

## 2010-06-13 DIAGNOSIS — I4891 Unspecified atrial fibrillation: Secondary | ICD-10-CM

## 2010-06-20 ENCOUNTER — Telehealth (INDEPENDENT_AMBULATORY_CARE_PROVIDER_SITE_OTHER): Payer: Self-pay | Admitting: *Deleted

## 2010-06-26 NOTE — Progress Notes (Signed)
  ROI completed and faxed to me by Pt, forwarded to [facility] for records 2010-2012 [facility]  June 20, 2010 9:03 AM

## 2010-07-14 LAB — PROTIME-INR
INR: 1.52 — ABNORMAL HIGH (ref 0.00–1.49)
INR: 1.75 — ABNORMAL HIGH (ref 0.00–1.49)
Prothrombin Time: 18.3 seconds — ABNORMAL HIGH (ref 11.6–15.2)
Prothrombin Time: 20.3 seconds — ABNORMAL HIGH (ref 11.6–15.2)

## 2010-07-14 LAB — COMPREHENSIVE METABOLIC PANEL
AST: 38 U/L — ABNORMAL HIGH (ref 0–37)
BUN: 19 mg/dL (ref 6–23)
CO2: 27 mEq/L (ref 19–32)
Calcium: 8.6 mg/dL (ref 8.4–10.5)
Creatinine, Ser: 1.51 mg/dL — ABNORMAL HIGH (ref 0.4–1.2)
GFR calc Af Amer: 40 mL/min — ABNORMAL LOW (ref >60)
GFR calc non Af Amer: 33 mL/min — ABNORMAL LOW (ref >60)
Glucose, Bld: 100 mg/dL — ABNORMAL HIGH (ref 70–99)

## 2010-07-14 LAB — CARDIAC PANEL(CRET KIN+CKTOT+MB+TROPI)
CK, MB: 2.5 ng/mL (ref 0.3–4.0)
Total CK: 130 U/L (ref 7–177)

## 2010-07-14 LAB — DIFFERENTIAL
Basophils Absolute: 0 10*3/uL (ref 0.0–0.1)
Lymphocytes Relative: 23 % (ref 12–46)
Lymphs Abs: 0.9 10*3/uL (ref 0.7–4.0)
Neutro Abs: 2.2 10*3/uL (ref 1.7–7.7)
Neutrophils Relative %: 56 % (ref 43–77)

## 2010-07-14 LAB — UIFE/LIGHT CHAINS/TP QN, 24-HR UR
Albumin, U: DETECTED
Alpha 2, Urine: DETECTED — AB
Beta, Urine: DETECTED — AB
Free Lambda Lt Chains,Ur: 0.16 mg/dL (ref 0.08–1.01)
Total Protein, Urine: 7.7 mg/dL

## 2010-07-14 LAB — CBC
Hemoglobin: 9.2 g/dL — ABNORMAL LOW (ref 12.0–15.0)
MCH: 29.2 pg (ref 26.0–34.0)
MCHC: 34.2 g/dL (ref 30.0–36.0)
MCV: 85.3 fL (ref 78.0–100.0)

## 2010-07-14 LAB — HEMOCCULT GUIAC POC 1CARD (OFFICE): Fecal Occult Bld: NEGATIVE

## 2010-07-15 LAB — D-DIMER, QUANTITATIVE: D-Dimer, Quant: 0.38 ug/mL-FEU (ref 0.00–0.48)

## 2010-07-15 LAB — DIFFERENTIAL
Basophils Absolute: 0 10*3/uL (ref 0.0–0.1)
Basophils Relative: 1 % (ref 0–1)
Basophils Relative: 1 % (ref 0–1)
Eosinophils Absolute: 0.2 10*3/uL (ref 0.0–0.7)
Eosinophils Relative: 4 % (ref 0–5)
Lymphocytes Relative: 23 % (ref 12–46)
Lymphs Abs: 1 10*3/uL (ref 0.7–4.0)
Monocytes Absolute: 0.7 10*3/uL (ref 0.1–1.0)
Monocytes Absolute: 0.7 10*3/uL (ref 0.1–1.0)
Monocytes Relative: 15 % — ABNORMAL HIGH (ref 3–12)
Neutro Abs: 2.3 10*3/uL (ref 1.7–7.7)
Neutrophils Relative %: 58 % (ref 43–77)

## 2010-07-15 LAB — CBC
MCHC: 34 g/dL (ref 30.0–36.0)
MCHC: 34.3 g/dL (ref 30.0–36.0)
MCV: 84.5 fL (ref 78.0–100.0)
Platelets: 216 10*3/uL (ref 150–400)
Platelets: 217 10*3/uL (ref 150–400)
RDW: 14.5 % (ref 11.5–15.5)
RDW: 14.7 % (ref 11.5–15.5)
WBC: 4 10*3/uL (ref 4.0–10.5)
WBC: 4.5 10*3/uL (ref 4.0–10.5)

## 2010-07-15 LAB — PROTIME-INR
INR: 1.69 — ABNORMAL HIGH (ref 0.00–1.49)
Prothrombin Time: 19.7 seconds — ABNORMAL HIGH (ref 11.6–15.2)

## 2010-07-15 LAB — COMPREHENSIVE METABOLIC PANEL
ALT: 40 U/L — ABNORMAL HIGH (ref 0–35)
Albumin: 3.5 g/dL (ref 3.5–5.2)
Alkaline Phosphatase: 84 U/L (ref 39–117)
Calcium: 9.1 mg/dL (ref 8.4–10.5)
GFR calc Af Amer: 35 mL/min — ABNORMAL LOW (ref >60)
Glucose, Bld: 102 mg/dL — ABNORMAL HIGH (ref 70–99)
Potassium: 3.9 mEq/L (ref 3.5–5.1)
Sodium: 139 mEq/L (ref 135–145)
Total Protein: 6.6 g/dL (ref 6.0–8.3)

## 2010-07-15 LAB — GLUCOSE, CAPILLARY: Glucose-Capillary: 164 mg/dL — ABNORMAL HIGH (ref 70–99)

## 2010-07-15 LAB — IRON AND TIBC
Iron: 75 ug/dL (ref 42–135)
Saturation Ratios: 21 % (ref 20–55)
TIBC: 354 ug/dL (ref 250–470)
UIBC: 279 ug/dL

## 2010-07-15 LAB — BLOOD GAS, ARTERIAL
Acid-Base Excess: 0.9 mmol/L (ref 0.0–2.0)
Drawn by: 257701
O2 Content: 2 L/min
O2 Saturation: 96.2 %
TCO2: 22.7 mmol/L (ref 0–100)
pCO2 arterial: 35.6 mmHg (ref 35.0–45.0)
pO2, Arterial: 82.6 mmHg (ref 80.0–100.0)

## 2010-07-15 LAB — BASIC METABOLIC PANEL
BUN: 25 mg/dL — ABNORMAL HIGH (ref 6–23)
Creatinine, Ser: 1.79 mg/dL — ABNORMAL HIGH (ref 0.4–1.2)
GFR calc non Af Amer: 27 mL/min — ABNORMAL LOW (ref >60)
Glucose, Bld: 98 mg/dL (ref 70–99)

## 2010-07-15 LAB — POCT CARDIAC MARKERS
CKMB, poc: <1 ng/mL — ABNORMAL LOW (ref 1.0–8.0)
Myoglobin, poc: 194 ng/mL (ref 12–200)
Troponin i, poc: <0.05 ng/mL (ref 0.00–0.09)

## 2010-07-15 LAB — CARDIAC PANEL(CRET KIN+CKTOT+MB+TROPI)
CK, MB: 3.3 ng/mL (ref 0.3–4.0)
Relative Index: 2.3 (ref 0.0–2.5)
Total CK: 145 U/L (ref 7–177)
Troponin I: 0.01 ng/mL (ref 0.00–0.06)

## 2010-07-15 LAB — BRAIN NATRIURETIC PEPTIDE: Pro B Natriuretic peptide (BNP): 58.6 pg/mL (ref 0.0–100.0)

## 2010-07-15 LAB — VITAMIN B12: Vitamin B-12: 462 pg/mL (ref 211–911)

## 2010-07-15 LAB — FERRITIN: Ferritin: 14 ng/mL (ref 10–291)

## 2010-07-16 LAB — BASIC METABOLIC PANEL
BUN: 35 mg/dL — ABNORMAL HIGH (ref 6–23)
CO2: 24 mEq/L (ref 19–32)
Chloride: 105 mEq/L (ref 96–112)
GFR calc non Af Amer: 22 mL/min — ABNORMAL LOW (ref >60)
Glucose, Bld: 113 mg/dL — ABNORMAL HIGH (ref 70–99)
Potassium: 4.3 mEq/L (ref 3.5–5.1)
Sodium: 136 mEq/L (ref 135–145)

## 2010-07-16 LAB — CK TOTAL AND CKMB (NOT AT ARMC)
CK, MB: 2.3 ng/mL (ref 0.3–4.0)
Relative Index: 2 (ref 0.0–2.5)

## 2010-07-16 LAB — DIFFERENTIAL
Basophils Absolute: 0 10*3/uL (ref 0.0–0.1)
Eosinophils Absolute: 0.3 10*3/uL (ref 0.0–0.7)
Eosinophils Relative: 5 % (ref 0–5)
Lymphocytes Relative: 19 % (ref 12–46)
Monocytes Absolute: 0.6 10*3/uL (ref 0.1–1.0)

## 2010-07-16 LAB — CBC
HCT: 29.5 % — ABNORMAL LOW (ref 36.0–46.0)
Hemoglobin: 9.9 g/dL — ABNORMAL LOW (ref 12.0–15.0)
MCV: 85.8 fL (ref 78.0–100.0)
Platelets: 240 10*3/uL (ref 150–400)
RDW: 14.1 % (ref 11.5–15.5)

## 2010-07-16 LAB — URINALYSIS, ROUTINE W REFLEX MICROSCOPIC
Glucose, UA: NEGATIVE mg/dL
Protein, ur: NEGATIVE mg/dL
Specific Gravity, Urine: 1.009 (ref 1.005–1.030)
Urobilinogen, UA: 0.2 mg/dL (ref 0.0–1.0)

## 2010-07-16 LAB — URINE MICROSCOPIC-ADD ON

## 2010-07-18 LAB — DIFFERENTIAL
Basophils Absolute: 0.1 K/uL (ref 0.0–0.1)
Basophils Relative: 1 % (ref 0–1)
Eosinophils Absolute: 0.1 K/uL (ref 0.0–0.7)
Eosinophils Relative: 2 % (ref 0–5)
Lymphocytes Relative: 20 % (ref 12–46)
Lymphs Abs: 1.2 K/uL (ref 0.7–4.0)
Monocytes Absolute: 0.7 K/uL (ref 0.1–1.0)
Monocytes Relative: 11 % (ref 3–12)
Neutro Abs: 3.9 K/uL (ref 1.7–7.7)
Neutrophils Relative %: 66 % (ref 43–77)

## 2010-07-18 LAB — COMPREHENSIVE METABOLIC PANEL
ALT: 48 U/L — ABNORMAL HIGH (ref 0–35)
AST: 33 U/L (ref 0–37)
AST: 52 U/L — ABNORMAL HIGH (ref 0–37)
Alkaline Phosphatase: 108 U/L (ref 39–117)
BUN: 32 mg/dL — ABNORMAL HIGH (ref 6–23)
CO2: 25 mEq/L (ref 19–32)
CO2: 26 mEq/L (ref 19–32)
Calcium: 9.1 mg/dL (ref 8.4–10.5)
Chloride: 100 mEq/L (ref 96–112)
Chloride: 106 mEq/L (ref 96–112)
Creatinine, Ser: 1.36 mg/dL — ABNORMAL HIGH (ref 0.4–1.2)
GFR calc Af Amer: 31 mL/min — ABNORMAL LOW (ref >60)
GFR calc non Af Amer: 37 mL/min — ABNORMAL LOW (ref >60)
GFR calc non Af Amer: 26 mL/min — ABNORMAL LOW (ref >60)
Glucose, Bld: 104 mg/dL — ABNORMAL HIGH (ref 70–99)
Glucose, Bld: 112 mg/dL — ABNORMAL HIGH (ref 70–99)
Sodium: 138 mEq/L (ref 135–145)
Total Bilirubin: 0.8 mg/dL (ref 0.3–1.2)
Total Bilirubin: 0.6 mg/dL (ref 0.3–1.2)

## 2010-07-18 LAB — BLOOD GAS, ARTERIAL
Acid-base deficit: 2.7 mmol/L — ABNORMAL HIGH (ref 0.0–2.0)
Bicarbonate: 22.3 mEq/L (ref 20.0–24.0)
Patient temperature: 97
TCO2: 23.6 mmol/L (ref 0–100)
pCO2 arterial: 41.6 mmHg (ref 35.0–45.0)
pH, Arterial: 7.343 — ABNORMAL LOW (ref 7.350–7.400)

## 2010-07-18 LAB — POCT I-STAT, CHEM 8
BUN: 33 mg/dL — ABNORMAL HIGH (ref 6–23)
Calcium, Ion: 1.18 mmol/L (ref 1.12–1.32)
Chloride: 104 meq/L (ref 96–112)
Creatinine, Ser: 1.3 mg/dL — ABNORMAL HIGH (ref 0.4–1.2)
Glucose, Bld: 93 mg/dL (ref 70–99)
HCT: 30 % — ABNORMAL LOW (ref 36.0–46.0)
Hemoglobin: 10.2 g/dL — ABNORMAL LOW (ref 12.0–15.0)
Potassium: 4.7 meq/L (ref 3.5–5.1)
Sodium: 132 meq/L — ABNORMAL LOW (ref 135–145)
TCO2: 26 mmol/L (ref 0–100)

## 2010-07-18 LAB — HEPARIN LEVEL (UNFRACTIONATED)
Heparin Unfractionated: 0.43 IU/mL (ref 0.30–0.70)
Heparin Unfractionated: 0.47 IU/mL (ref 0.30–0.70)

## 2010-07-18 LAB — URINE MICROSCOPIC-ADD ON

## 2010-07-18 LAB — BASIC METABOLIC PANEL
Calcium: 9 mg/dL (ref 8.4–10.5)
Chloride: 106 mEq/L (ref 96–112)
Creatinine, Ser: 1.3 mg/dL — ABNORMAL HIGH (ref 0.4–1.2)
GFR calc Af Amer: 47 mL/min — ABNORMAL LOW (ref >60)
GFR calc non Af Amer: 39 mL/min — ABNORMAL LOW (ref >60)
GFR calc non Af Amer: 36 mL/min — ABNORMAL LOW (ref >60)
Potassium: 4.1 mEq/L (ref 3.5–5.1)
Sodium: 135 mEq/L (ref 135–145)
Sodium: 134 mEq/L — ABNORMAL LOW (ref 135–145)

## 2010-07-18 LAB — CROSSMATCH: Antibody Screen: NEGATIVE

## 2010-07-18 LAB — HEMOGLOBIN A1C
Hgb A1c MFr Bld: 6.4 % — ABNORMAL HIGH (ref 4.6–6.1)
Mean Plasma Glucose: 137 mg/dL

## 2010-07-18 LAB — CARDIAC PANEL(CRET KIN+CKTOT+MB+TROPI)
CK, MB: 3.1 ng/mL (ref 0.3–4.0)
CK, MB: 3.6 ng/mL (ref 0.3–4.0)
CK, MB: 4.6 ng/mL — ABNORMAL HIGH (ref 0.3–4.0)
Relative Index: 1.2 (ref 0.0–2.5)
Relative Index: 1.3 (ref 0.0–2.5)
Relative Index: 1.5 (ref 0.0–2.5)
Total CK: 279 U/L — ABNORMAL HIGH (ref 7–177)
Total CK: 298 U/L — ABNORMAL HIGH (ref 7–177)

## 2010-07-18 LAB — POCT I-STAT 3, ART BLOOD GAS (G3+)
Bicarbonate: 20 mEq/L (ref 20.0–24.0)
TCO2: 21 mmol/L (ref 0–100)
pCO2 arterial: 26.4 mmHg — ABNORMAL LOW (ref 35.0–45.0)
pH, Arterial: 7.487 — ABNORMAL HIGH (ref 7.350–7.400)

## 2010-07-18 LAB — GLUCOSE, CAPILLARY: Glucose-Capillary: 86 mg/dL (ref 70–99)

## 2010-07-18 LAB — CBC
HCT: 29.8 % — ABNORMAL LOW (ref 36.0–46.0)
HCT: 30.9 % — ABNORMAL LOW (ref 36.0–46.0)
HCT: 24.9 % — ABNORMAL LOW (ref 36.0–46.0)
Hemoglobin: 10.2 g/dL — ABNORMAL LOW (ref 12.0–15.0)
Hemoglobin: 10.5 g/dL — ABNORMAL LOW (ref 12.0–15.0)
Hemoglobin: 10.6 g/dL — ABNORMAL LOW (ref 12.0–15.0)
Hemoglobin: 10.2 g/dL — ABNORMAL LOW (ref 12.0–15.0)
Hemoglobin: 8.5 g/dL — ABNORMAL LOW (ref 12.0–15.0)
MCHC: 34.3 g/dL (ref 30.0–36.0)
MCHC: 34 g/dL (ref 30.0–36.0)
MCV: 86.1 fL (ref 78.0–100.0)
MCV: 86.6 fL (ref 78.0–100.0)
Platelets: 235 K/uL (ref 150–400)
RBC: 3.44 MIL/uL — ABNORMAL LOW (ref 3.87–5.11)
RBC: 3.5 MIL/uL — ABNORMAL LOW (ref 3.87–5.11)
RBC: 3.59 MIL/uL — ABNORMAL LOW (ref 3.87–5.11)
RBC: 3.51 MIL/uL — ABNORMAL LOW (ref 3.87–5.11)
RDW: 13.9 % (ref 11.5–15.5)
WBC: 5.8 10*3/uL (ref 4.0–10.5)
WBC: 6 K/uL (ref 4.0–10.5)
WBC: 5.8 10*3/uL (ref 4.0–10.5)
WBC: 9 10*3/uL (ref 4.0–10.5)

## 2010-07-18 LAB — POCT I-STAT 3, VENOUS BLOOD GAS (G3P V)
Bicarbonate: 20.8 mEq/L (ref 20.0–24.0)
TCO2: 22 mmol/L (ref 0–100)
pCO2, Ven: 33.1 mmHg — ABNORMAL LOW (ref 45.0–50.0)
pH, Ven: 7.407 — ABNORMAL HIGH (ref 7.250–7.300)

## 2010-07-18 LAB — URINALYSIS, ROUTINE W REFLEX MICROSCOPIC
Bilirubin Urine: NEGATIVE
Glucose, UA: NEGATIVE mg/dL
Hgb urine dipstick: NEGATIVE
Ketones, ur: NEGATIVE mg/dL
Protein, ur: NEGATIVE mg/dL
pH: 5 (ref 5.0–8.0)

## 2010-07-18 LAB — TSH: TSH: 1.497 u[IU]/mL (ref 0.350–4.500)

## 2010-07-18 LAB — POCT CARDIAC MARKERS
Myoglobin, poc: 141 ng/mL (ref 12–200)
Troponin i, poc: <0.05 ng/mL (ref 0.00–0.09)
Troponin i, poc: <0.05 ng/mL (ref 0.00–0.09)

## 2010-07-18 LAB — LIPID PANEL: VLDL: 17 mg/dL (ref 0–40)

## 2010-07-18 LAB — PROTIME-INR
INR: 0.99 (ref 0.00–1.49)
Prothrombin Time: 13 s (ref 11.6–15.2)
Prothrombin Time: 16.8 seconds — ABNORMAL HIGH (ref 11.6–15.2)

## 2010-07-18 LAB — MRSA PCR SCREENING
MRSA by PCR: NEGATIVE
MRSA by PCR: NEGATIVE

## 2010-07-18 LAB — APTT: aPTT: 31 s (ref 24–37)

## 2010-07-22 LAB — CBC
HCT: 26.5 % — ABNORMAL LOW (ref 36.0–46.0)
HCT: 29 % — ABNORMAL LOW (ref 36.0–46.0)
Hemoglobin: 9.1 g/dL — ABNORMAL LOW (ref 12.0–15.0)
Hemoglobin: 9.1 g/dL — ABNORMAL LOW (ref 12.0–15.0)
Hemoglobin: 9.3 g/dL — ABNORMAL LOW (ref 12.0–15.0)
MCHC: 34 g/dL (ref 30.0–36.0)
MCHC: 34.5 g/dL (ref 30.0–36.0)
MCV: 86.4 fL (ref 78.0–100.0)
Platelets: 191 10*3/uL (ref 150–400)
Platelets: 224 10*3/uL (ref 150–400)
RDW: 14 % (ref 11.5–15.5)
RDW: 14.1 % (ref 11.5–15.5)
RDW: 14.2 % (ref 11.5–15.5)
RDW: 14.3 % (ref 11.5–15.5)

## 2010-07-22 LAB — IRON AND TIBC
Iron: 76 ug/dL (ref 42–135)
TIBC: 311 ug/dL (ref 250–470)
UIBC: 235 ug/dL

## 2010-07-22 LAB — BASIC METABOLIC PANEL
CO2: 23 mEq/L (ref 19–32)
Chloride: 108 mEq/L (ref 96–112)
Glucose, Bld: 168 mg/dL — ABNORMAL HIGH (ref 70–99)
Potassium: 3.6 mEq/L (ref 3.5–5.1)
Sodium: 135 mEq/L (ref 135–145)

## 2010-07-22 LAB — LIPID PANEL
Cholesterol: 160 mg/dL (ref 0–200)
HDL: 60 mg/dL (ref >39)
Total CHOL/HDL Ratio: 2.7 RATIO

## 2010-07-22 LAB — PROTIME-INR
INR: 1.17 (ref 0.00–1.49)
INR: 1.63 — ABNORMAL HIGH (ref 0.00–1.49)
Prothrombin Time: 14.8 seconds (ref 11.6–15.2)
Prothrombin Time: 19.2 seconds — ABNORMAL HIGH (ref 11.6–15.2)

## 2010-07-22 LAB — HEPARIN LEVEL (UNFRACTIONATED): Heparin Unfractionated: 0.28 IU/mL — ABNORMAL LOW (ref 0.30–0.70)

## 2010-07-22 LAB — FERRITIN: Ferritin: 28 ng/mL (ref 10–291)

## 2010-07-25 ENCOUNTER — Ambulatory Visit (HOSPITAL_COMMUNITY): Payer: Medicare Other | Attending: Internal Medicine | Admitting: Radiology

## 2010-07-25 VITALS — Ht 62.0 in | Wt 140.0 lb

## 2010-07-25 DIAGNOSIS — R0789 Other chest pain: Secondary | ICD-10-CM

## 2010-07-25 DIAGNOSIS — Z01810 Encounter for preprocedural cardiovascular examination: Secondary | ICD-10-CM | POA: Insufficient documentation

## 2010-07-25 DIAGNOSIS — R0602 Shortness of breath: Secondary | ICD-10-CM

## 2010-07-25 DIAGNOSIS — I2510 Atherosclerotic heart disease of native coronary artery without angina pectoris: Secondary | ICD-10-CM

## 2010-07-25 DIAGNOSIS — I739 Peripheral vascular disease, unspecified: Secondary | ICD-10-CM

## 2010-07-25 MED ORDER — TECHNETIUM TC 99M TETROFOSMIN IV KIT
33.0000 | PACK | Freq: Once | INTRAVENOUS | Status: AC | PRN
  Administered 2010-07-25: 33 via INTRAVENOUS

## 2010-07-25 MED ORDER — TECHNETIUM TC 99M TETROFOSMIN IV KIT
11.0000 | PACK | Freq: Once | INTRAVENOUS | Status: AC | PRN
  Administered 2010-07-25: 11 via INTRAVENOUS

## 2010-07-25 NOTE — Progress Notes (Signed)
[facility] SITE 3 NUCLEAR MED [address] [place] [address] [phone]  Cardiology Nuclear Med Study [name] female 1923/09/08   Nuclear Med Background Indication for Stress Test:  Evaluation for Ischemia and Pending Surgical Clearance for Endometrial CA in April 2012. History:  2/11 Echo:EF=65-70%, mild LVH, 2/11 Cath:N/O CAD, h/o PAF Cardiac Risk Factors: Carotid Disease, Family History - CAD, Hypertension, Lipids and TIA  Symptoms:  Chest Tightness (last date of chest discomfort was 2-days ago), DOE, Fatigue and SOB   Nuclear Pre-Procedure Caffeine/Decaff Intake:  7:00pm NPO After: 7:00pm   Lungs:  Clear.  O2 sat at rest was 96% on RA. IV 0.9% NS with Angio Cath:  20g  IV Site: R Antecubital  IV Started by:  Stanton Kidney, EMT-P  Chest Size (in):  36 Cup Size:   C  Height: 5\' 2"  (1.575 m)  Weight:  140 lb (63.504 kg)  BMI:  Body mass index is 25.61 kg/(m^2). Tech Comments:  n/a    Nuclear Med Study 1 or 2 day study: 1 day  Stress Test Type:  Stress  Reading MD: Cassell Clement, MD  Order Authorizing Provider:  Geoffry Paradise, MD  Resting Radionuclide: Technetium 44m Tetrofosmin  Resting Radionuclide Dose: 11 mCi   Stress Radionuclide:  Technetium 53m Tetrofosmin  Stress Radionuclide Dose: 33 mCi           Stress Protocol Rest HR: 56 Stress HR: 117  Rest BP: 126/69 Stress BP: 166/72  Exercise Time:  5:31 min METS: 7.0  Predicted HR: 136 % of Maximum: 86%        Dose of Adenosine:  n/a Dose of Lexiscan:  n/a  Dose of Atropine:  n/a Dose of Dobutamine:  n/a  Stress Test Technologist: [employer], CMA-N  Nuclear Technologist:  Doyne Keel, CNMT     Rest Procedure:  Myocardial perfusion imaging was performed at rest 45 minutes following the intravenous administration of Technetium 95m Tetrofosmin. Rest ECG: No acute changes.  Stress Procedure:  The patient exercised for 5:31 on the treadmill utilizing the Bruce protocol.  The  patient stopped due to fatigue and significant dyspnea.  Her O2 sat dropped from 96% to 83% at peak exercise. She denied any chest pain.  There were no diagnostic ST-T wave changes.  Technetium 25m Tetrofosmin was injected at peak exercise and myocardial perfusion imaging was performed after a brief delay. Stress ECG: No significant change from baseline ECG  QPS Raw Data Images:  Normal; no motion artifact; normal heart/lung ratio. Stress Images:  Normal homogeneous uptake in all areas of the myocardium. Rest Images:  Normal homogeneous uptake in all areas of the myocardium. Subtraction (SDS):  No evidence of ischemia.  Findings Risk Category:  Normal nuclear study. Clinically Abnormal:  No Ischemia:  No Fixed Defect:  No LV Dysfunction:  No Transient Ischemic Dilatation (Normal <1.22):  0.95 Lung/Heart Ratio (Normal <0.45):  0.30  Quantitative Gated Spect Images QGS EDV:  58 ml QGS ESV:  16 ml QGS cine images: Normal wall motion. QGS EF:  72 %  Impression Exercise Capacity:  Good exercise capacity. BP Response:  Normal blood pressure response. Clinical Symptoms:  No chest pain. ECG Impression:  No significant ST segment change suggestive of ischemia. Comparison with Prior Nuclear Study: No previous nuclear study performed  Overall Impression:  Normal stress nuclear study.

## 2010-07-31 ENCOUNTER — Telehealth: Payer: Self-pay | Admitting: Cardiology

## 2010-07-31 NOTE — Telephone Encounter (Signed)
It talked with pt and  daughter,Toni.  They were both given results of myoview. I will fax a copy of myoview to Dr Jacky Kindle and Dr Noland Fordyce at [facility].

## 2010-07-31 NOTE — Telephone Encounter (Signed)
Stress myoview was normal.  Should be ok for upcoming surgery.

## 2010-07-31 NOTE — Telephone Encounter (Signed)
Pt's dtr calling re stress test being sent to dr Jacky Kindle so he can send them to [facility] for an upcoming surgery, wanted to see if done yet, if not can you fax to dr Jacky Kindle [phone] and fax [phone] dr Hazle Coca [facility]- pt's dtr [phone] or  [phone] Francie Massing Also requesting results

## 2010-08-08 ENCOUNTER — Ambulatory Visit (INDEPENDENT_AMBULATORY_CARE_PROVIDER_SITE_OTHER): Payer: Medicare Other | Admitting: Cardiology

## 2010-08-08 ENCOUNTER — Telehealth: Payer: Self-pay | Admitting: Internal Medicine

## 2010-08-08 ENCOUNTER — Telehealth: Payer: Self-pay | Admitting: *Deleted

## 2010-08-08 ENCOUNTER — Encounter: Payer: Self-pay | Admitting: Cardiology

## 2010-08-08 ENCOUNTER — Other Ambulatory Visit: Payer: Self-pay | Admitting: Internal Medicine

## 2010-08-08 VITALS — BP 128/68 | HR 56 | Ht 62.0 in | Wt 142.0 lb

## 2010-08-08 DIAGNOSIS — R0602 Shortness of breath: Secondary | ICD-10-CM

## 2010-08-08 DIAGNOSIS — R0989 Other specified symptoms and signs involving the circulatory and respiratory systems: Secondary | ICD-10-CM

## 2010-08-08 DIAGNOSIS — I509 Heart failure, unspecified: Secondary | ICD-10-CM

## 2010-08-08 DIAGNOSIS — R0609 Other forms of dyspnea: Secondary | ICD-10-CM

## 2010-08-08 DIAGNOSIS — I5032 Chronic diastolic (congestive) heart failure: Secondary | ICD-10-CM

## 2010-08-08 DIAGNOSIS — I2510 Atherosclerotic heart disease of native coronary artery without angina pectoris: Secondary | ICD-10-CM | POA: Insufficient documentation

## 2010-08-08 DIAGNOSIS — I4891 Unspecified atrial fibrillation: Secondary | ICD-10-CM

## 2010-08-08 NOTE — Assessment & Plan Note (Signed)
S/p right CEA, follows with [facility].

## 2010-08-08 NOTE — Assessment & Plan Note (Addendum)
Paroxysmal.  She is in sinus rhythm now.  No recent atrial fibrillation-type symptoms.  She is no longer taking dronedarone, and it does not seem like she really needs it.  She is on coumadin.  

## 2010-08-08 NOTE — Telephone Encounter (Signed)
Dr. Shelle Iron pt is requesting to switch from you to Dr. Marchelle Gearing. Please advise if this is ok. Thanks. Carron Curie, CMA

## 2010-08-08 NOTE — Telephone Encounter (Signed)
MR Okay to see pt. Pt scheduled 4/16 at 2:15.

## 2010-08-08 NOTE — Progress Notes (Signed)
PCP: Dr. Jacky Kindle  77 yo with history of paroxysmal atrial fibrillation, nonobstructive CAD, and chronic exertional dyspnea presents for cardiology followup.  She has seen Dr. Juanda Chance in the past and is seeing me for the first time today.  Her main problem recently has been exertional dyspnea.  She has had quite significant shortness of breath for 1.5-2 years now.  There was a definite change in her symptomatology at that time.  She is short of breath with moderate housework like sweeping, making beds.  She is short of breath walking around the block.  She is very short of breath with steps or inclines.  No exertional chest pain.  Patient's daughter is here with her today.  She corroborates that symptoms and actually states that she thinks her mother minimizes her symptoms (she appears very short of breath with most exertion).  She has not had any episodes of tachypalpitations/irregular heart rate.  No lightheadedness/syncope.   Patient has had an extensive workup for dyspnea.  She had a right and left heart cath in 2/11 that showed nonobstructive CAD as well as normal pulmonary artery pressure and normal wedge pressure.  Echo showed normal LV systolic function.  She had PFTs in early 2011 that were only minimally abnormal with mildly decreased DLCO.  She had a V/Q scan in 7/11 with no evidence for PE.  She never smoked.  She was seen by pulmonology and it was thought that reflux may be a cause of her symptoms.  She was strated on bid Protonix with some improvement but really minimal.  She recently was in our office for ETT-myoview as a pre-operative test before surgery for endometrial cancer (recently diagnosed, needs hysterectomy).  There was no evidence for ischemia or infarction on perfusion images or ECG but the patient's oxygen saturation dropped from 96% at rest to 83% at peak exertion.  We walked her in the office today and oxygen saturation dropped to 89-90% with exertion.  She has taken Lasix in the past  but it did not help so she is not taking it now.  Patient and daughter are very concerned that something has serious has been missed in her workup.   ECG: NSR, normal  PMH: 1. Atrial fibrillation: Paroxysmal.  She was on dronedarone briefly but it was stopped.  She is on coumadin.  2. CHF: Diastolic.  Echo (2/11) with mild LVH, EF 65-70%, PA systolic pressure 35 mmHg. 3. Carotid stenosis: right CEA in 2011. 4. Endometrial cancer 5. HTN 6. Anemia 7. GERD/hiatal hernia 8. Hypothyroidism 9. TIA 10. CAD: LHC (2/11) with 40% LAD stenosis, 50% mCFX stenosis.  ETT-myoview (3/12): 5:31 exercise, no ECG changes, oxygen saturation dropped from 96% at rest to 83% with exertion, no ischemia or infarction on perfusion images.  11. Dyspnea with exertion: Extensive workup in 2011.  Echo and left heart cath as above.  Right heart cath with mean PA pressure 23 mmHg and PCWP 11 mmHg.  V/Q scan (7/11) low probability.  PFTs (2/11) normal except for mildly decreased DLCO.  ETT-myoview as above.  O2 sats dropped with exercise.  Patient was evaluated by pulmonology, thought to have GERD as cause of dyspnea.    SH: Married, never smoked, retired.  Lives in [place].  Daughter is involved in her care.   FH: CAD  ROS: All systems reviewed and negative except as per HPI.   Current Outpatient Prescriptions  Medication Sig Dispense Refill  . aspirin 81 MG tablet Take 81 mg by mouth daily.        Marland Kitchen  bisoprolol (ZEBETA) 5 MG tablet Take 5 mg by mouth daily.        Marland Kitchen diltiazem (CARDIZEM CD) 240 MG 24 hr capsule Take 240 mg by mouth daily.        . iron polysaccharides (NIFEREX) 150 MG capsule Take 150 mg by mouth 2 (two) times daily.        Marland Kitchen levothyroxine (SYNTHROID, LEVOTHROID) 50 MCG tablet Take 50 mcg by mouth daily.        . Melatonin 3-10 MG TABS Take 1 tablet by mouth every evening.        . pantoprazole (PROTONIX) 40 MG tablet Take 40 mg by mouth 2 (two) times daily.       . simvastatin (ZOCOR) 20 MG  tablet Take 20 mg by mouth at bedtime.        Marland Kitchen warfarin (COUMADIN) 5 MG tablet Take by mouth as directed.        . nitroGLYCERIN (NITROSTAT) 0.4 MG SL tablet Place 0.4 mg under the tongue every 5 (five) minutes as needed.        Marland Kitchen DISCONTD: zolpidem (AMBIEN) 5 MG tablet Take 5 mg by mouth at bedtime as needed.           BP 128/68  Pulse 56  Ht 5\' 2"  (1.575 m)  Wt 142 lb (64.411 kg)  BMI 25.97 kg/m2 General: NAD Neck: No JVD, no thyromegaly or thyroid nodule.  Lungs: Clear to auscultation bilaterally with normal respiratory effort. CV: Nondisplaced PMI.  Heart regular S1/S2, no S3/S4, no murmur.  No peripheral edema.  No carotid bruit.  Normal pedal pulses.  Abdomen: Soft, nontender, no hepatosplenomegaly, no distention.  Skin: Intact without lesions or rashes.  Neurologic: Alert and oriented x 3.  Psych: Normal affect. Extremities: No clubbing or cyanosis.  HEENT: Normal.

## 2010-08-08 NOTE — Assessment & Plan Note (Signed)
As above, I am going to check a BNP.  However, she does not seem volume overloaded and I do not think that diastolic CHF alone can explain symptoms.  I am not going to start her on Lasix at this time.

## 2010-08-08 NOTE — Assessment & Plan Note (Signed)
Nonobstructive on recent cath.  Continue ASA 81 mg daily and statin.  Check lipids/LFTs with goal LDL < 70.

## 2010-08-08 NOTE — Assessment & Plan Note (Addendum)
Patient has had 1.5 years of significant exertional dyspnea (NYHA class III symptoms).  She does not appear volume overloaded on exam so I would not have her take standing Lasix.  Cardiac workup has showed nonobstructive CAD and normal filling pressures on right and left heart cath.  Echo showed preserved EF.  Recent myoview showed no ischemia or infarction.  She could certainly have some degree of diastolic CHF, but it is hard for me to explain her exertional hypoxia by diastolic CHF.  I will get a BNP.  The exertional hypoxia is quite concerning.  She dropped her oxygen saturation to 83% on her recent treadmill test.  She had relatively unremarkable PFTs about a year ago (but DLCO was mildly decreased).  V/Q scan last summer was unremarkable.  I am concerned that she could have a form of interstitial lung disease.  I am going to go ahead and repeat her PFTs and also get a high resolution chest CT (without contrast) to assess for interstitial lung disease.  I discussed her situation with Dr. Marchelle Gearing (pulmonary) and will have her followup with him next week.  She has an upcoming surgery for endometrial cancer.  She is stable for this surgery from a cardiac standpoint (should continue bisoprolol), but I would like her to see Dr. Marchelle Gearing pre-operatively given her exertional hypoxia.  I will also get a CBC given her history of anemia to see if this could be contributing to dyspnea.  If workup remains unremarkable, would get a cardiopulmonary exercise test.

## 2010-08-08 NOTE — Patient Instructions (Signed)
Your physician recommends that you return for a FASTING lipid profile/liver profile/BNP this week---786.09  427.31  Schedule an appointment for a pulmonary function test--PFT 786.09  Schedule an appointment with Dr Marchelle Gearing  in the pulmonary department before surgery 08/18/10.  Schedule an appointment to see Dr Shirlee Latch in 1 month.

## 2010-08-08 NOTE — Telephone Encounter (Signed)
I have no problems with this.  I had asked her at last visit to f/u prn.  However, she has had an EXTENSIVE cardiopulmonary workup, with nothing being found except her diastolic dysfunction.  She was treated for presumed reflux by Dr. Sherene Sires, and stated that she was greatly improved.  I suspect she does not have a pulmonary issue, and does not need further pulmonary evaluation.  I would have MR review the chart and see if he really wants to take this pt on.

## 2010-08-09 ENCOUNTER — Ambulatory Visit (INDEPENDENT_AMBULATORY_CARE_PROVIDER_SITE_OTHER)
Admission: RE | Admit: 2010-08-09 | Discharge: 2010-08-09 | Disposition: A | Discharge status: Home / Self Care | Payer: Medicare Other | Source: Ambulatory Visit | Attending: Cardiology | Admitting: Cardiology

## 2010-08-09 ENCOUNTER — Other Ambulatory Visit (INDEPENDENT_AMBULATORY_CARE_PROVIDER_SITE_OTHER): Payer: Medicare Other | Admitting: *Deleted

## 2010-08-09 DIAGNOSIS — R0602 Shortness of breath: Secondary | ICD-10-CM

## 2010-08-09 DIAGNOSIS — R0989 Other specified symptoms and signs involving the circulatory and respiratory systems: Secondary | ICD-10-CM

## 2010-08-09 DIAGNOSIS — R0609 Other forms of dyspnea: Secondary | ICD-10-CM

## 2010-08-09 DIAGNOSIS — I4891 Unspecified atrial fibrillation: Secondary | ICD-10-CM

## 2010-08-09 DIAGNOSIS — I2510 Atherosclerotic heart disease of native coronary artery without angina pectoris: Secondary | ICD-10-CM

## 2010-08-09 LAB — HEPATIC FUNCTION PANEL
ALT: 28 U/L (ref 0–35)
AST: 31 U/L (ref 0–37)
Alkaline Phosphatase: 148 U/L — ABNORMAL HIGH (ref 39–117)
Bilirubin, Direct: 0.1 mg/dL (ref 0.0–0.3)
Total Bilirubin: 0.5 mg/dL (ref 0.3–1.2)

## 2010-08-09 LAB — CBC WITH DIFFERENTIAL/PLATELET
Eosinophils Absolute: 0.3 10*3/uL (ref 0.0–0.7)
HCT: 30.9 % — ABNORMAL LOW (ref 36.0–46.0)
Lymphs Abs: 1 10*3/uL (ref 0.7–4.0)
MCHC: 34.1 g/dL (ref 30.0–36.0)
MCV: 85.7 fl (ref 78.0–100.0)
Monocytes Absolute: 0.6 10*3/uL (ref 0.1–1.0)
Neutrophils Relative %: 66.9 % (ref 43.0–77.0)
Platelets: 249 10*3/uL (ref 150.0–400.0)

## 2010-08-09 LAB — LIPID PANEL
HDL: 67.9 mg/dL (ref >39.00)
Total CHOL/HDL Ratio: 3

## 2010-08-09 LAB — BRAIN NATRIURETIC PEPTIDE: Pro B Natriuretic peptide (BNP): 99.7 pg/mL (ref 0.0–100.0)

## 2010-08-09 NOTE — Telephone Encounter (Signed)
Pt daughter aware of appt for ct and PFT and f/u with MR on Monday 08-13-10 at 2:15pm. Carron Curie, CMA

## 2010-08-10 ENCOUNTER — Ambulatory Visit (HOSPITAL_COMMUNITY)
Admission: RE | Admit: 2010-08-10 | Discharge: 2010-08-10 | Disposition: A | Discharge status: Home / Self Care | Payer: Medicare Other | Source: Ambulatory Visit | Attending: Cardiology | Admitting: Cardiology

## 2010-08-10 ENCOUNTER — Encounter: Payer: Self-pay | Admitting: Internal Medicine

## 2010-08-10 DIAGNOSIS — R0609 Other forms of dyspnea: Secondary | ICD-10-CM | POA: Insufficient documentation

## 2010-08-10 DIAGNOSIS — I4891 Unspecified atrial fibrillation: Secondary | ICD-10-CM

## 2010-08-10 DIAGNOSIS — R0989 Other specified symptoms and signs involving the circulatory and respiratory systems: Secondary | ICD-10-CM | POA: Insufficient documentation

## 2010-08-10 LAB — PULMONARY FUNCTION TEST

## 2010-08-10 IMAGING — CT CT HEAD W/O CM
1 of 2 series · 13 of 30 positions shown, 17 images · non-contrast
Comparison: 08/09/2004

CLINICAL DATA: Syncope, nausea, parasthesias

CT HEAD WITHOUT CONTRAST
TECHNIQUE: Contiguous axial images were obtained from the base of
the skull through the vertex without contrast.

[Series 2: brain · axial · 0.47mm/px · z∈[+94,+222]mm · 13 of 28 slices shown, 17 images]
[im 2/28  brain]
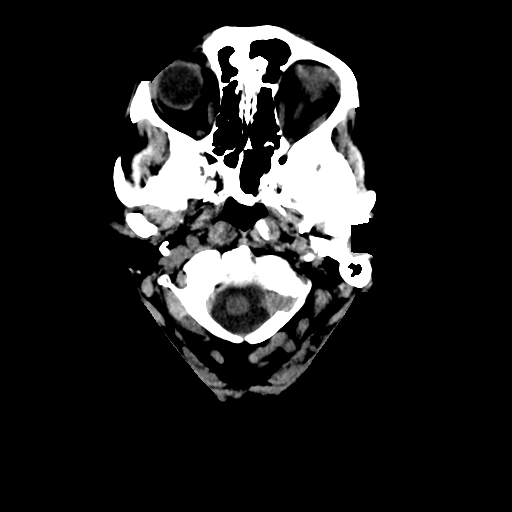
[im 2/28  bone]
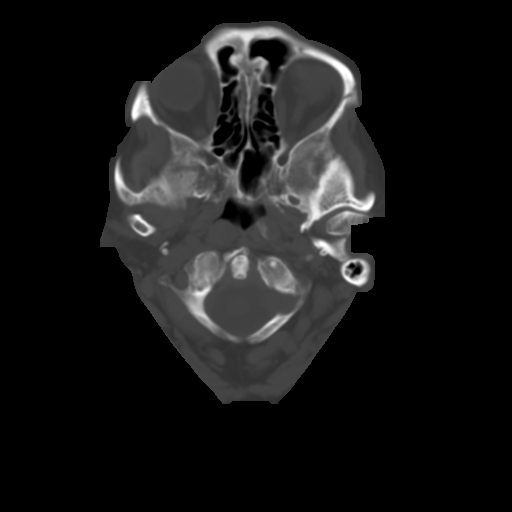
[im 4/28  brain]
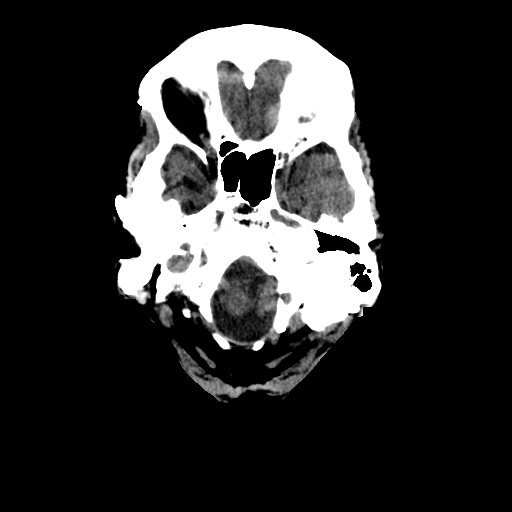
[im 6/28  brain]
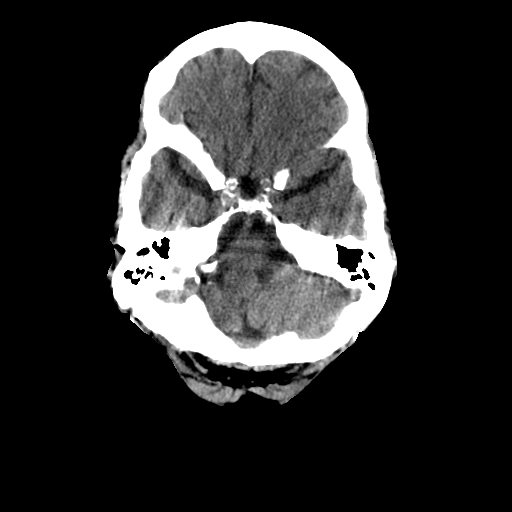
[im 8/28  brain]
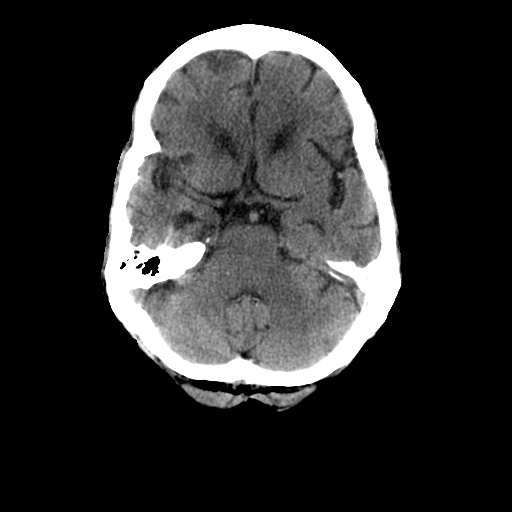
[im 10/28  brain]
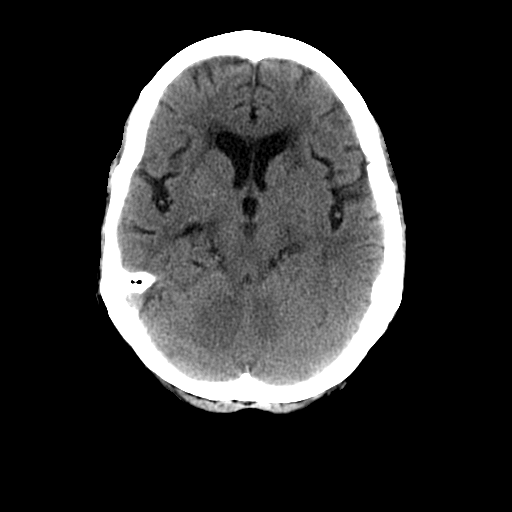
[im 10/28  bone]
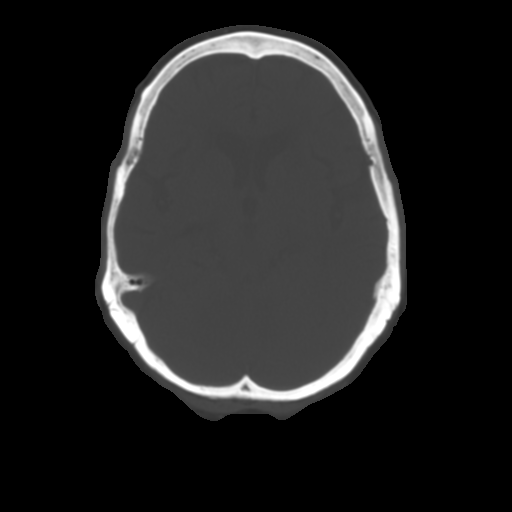
[im 12/28  brain]
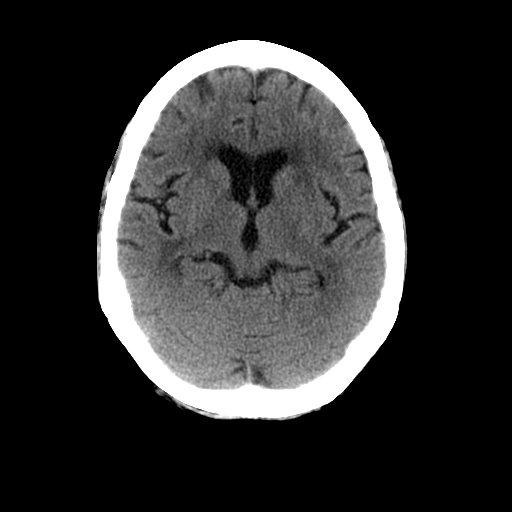
[im 14/28  brain]
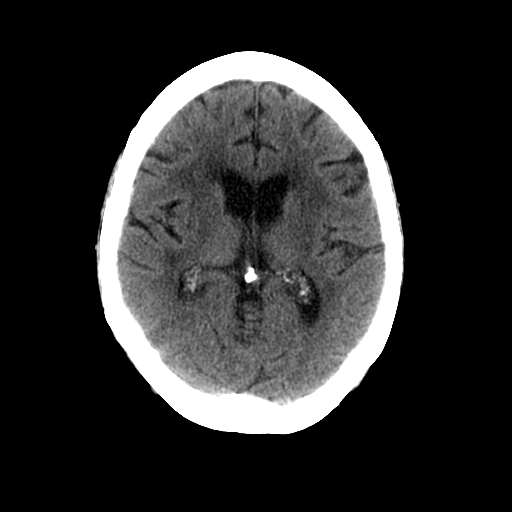
[im 16/28  brain]
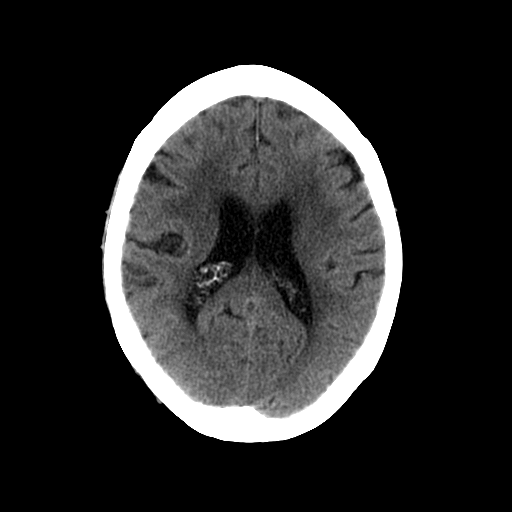
[im 18/28  brain]
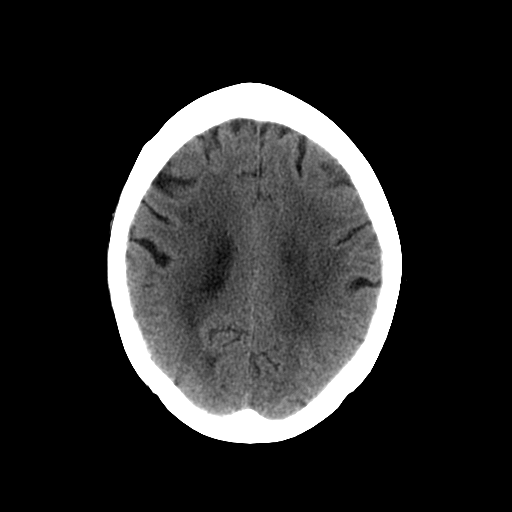
[im 18/28  bone]
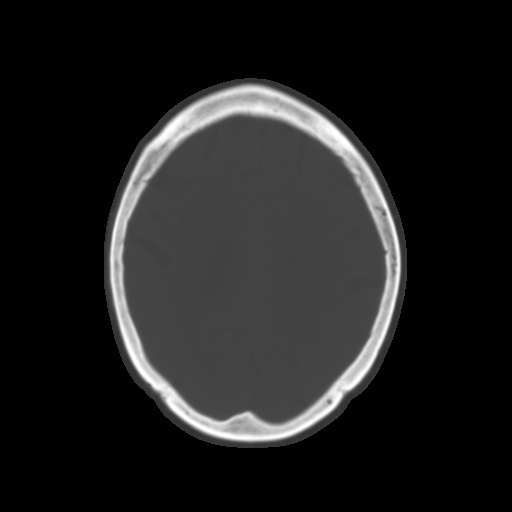
[im 20/28  brain]
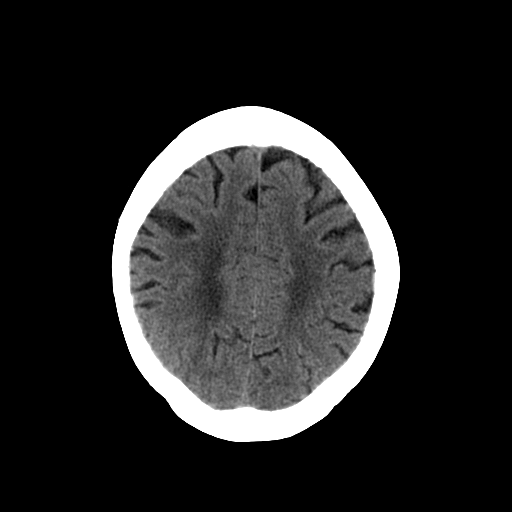
[im 22/28  brain]
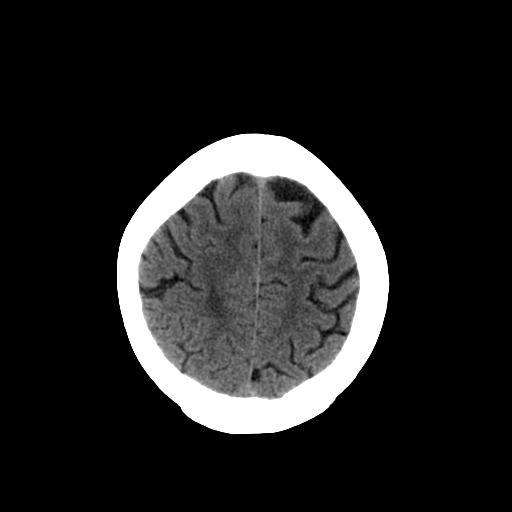
[im 24/28  brain]
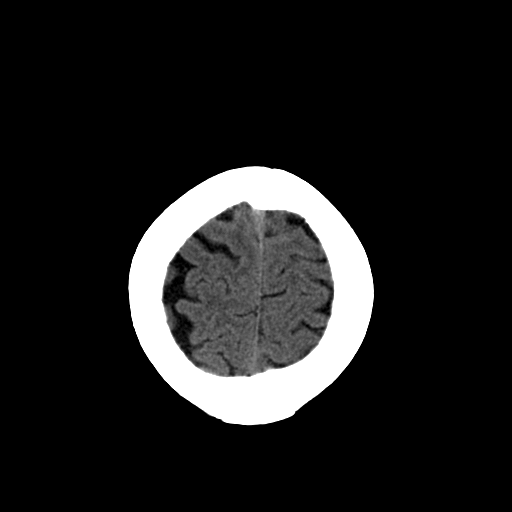
[im 26/28  brain]
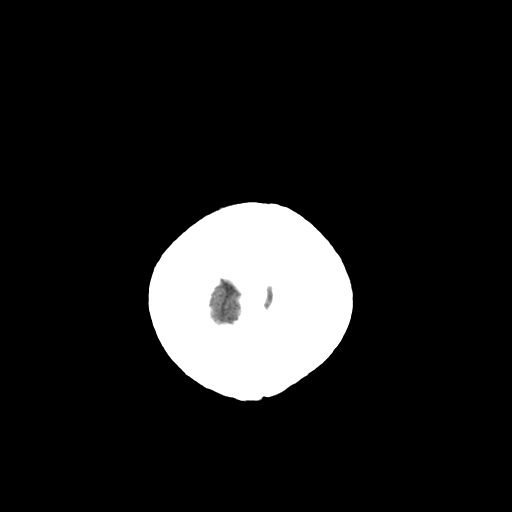
[im 26/28  bone]
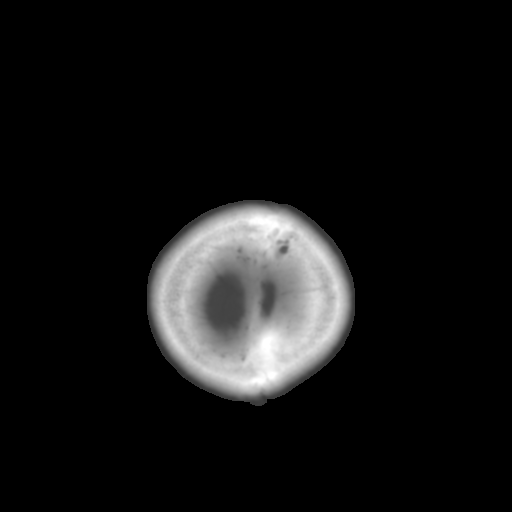

[13 of 30 positions shown; findings below may reference images not displayed]

FINDINGS: Diffuse brain atrophy and advanced microvascular ischemic
changes throughout the white matter.  No acute intracranial
hemorrhage, definite acute infarction, focal edema, mass lesion,
midline shift, herniation, hydrocephalus, or extra-axial fluid
collection.  Gray-white matter differentiation maintained.
Cisterns patent.  Atherosclerosis at the skull base.  Mastoids and
sinuses are clear.
IMPRESSION: Stable atrophy and advanced microvascular changes.
No acute process by noncontrast CT.

## 2010-08-13 ENCOUNTER — Ambulatory Visit (INDEPENDENT_AMBULATORY_CARE_PROVIDER_SITE_OTHER): Payer: Medicare Other | Admitting: Internal Medicine

## 2010-08-13 ENCOUNTER — Encounter: Payer: Self-pay | Admitting: Internal Medicine

## 2010-08-13 VITALS — BP 122/78 | HR 62 | Temp 98.7°F | Ht 62.0 in | Wt 141.4 lb

## 2010-08-13 DIAGNOSIS — J438 Other emphysema: Secondary | ICD-10-CM

## 2010-08-13 DIAGNOSIS — R0600 Dyspnea, unspecified: Secondary | ICD-10-CM

## 2010-08-13 DIAGNOSIS — R0989 Other specified symptoms and signs involving the circulatory and respiratory systems: Secondary | ICD-10-CM

## 2010-08-13 DIAGNOSIS — R0602 Shortness of breath: Secondary | ICD-10-CM

## 2010-08-13 DIAGNOSIS — J984 Other disorders of lung: Secondary | ICD-10-CM

## 2010-08-13 DIAGNOSIS — Z01811 Encounter for preprocedural respiratory examination: Secondary | ICD-10-CM

## 2010-08-13 MED ORDER — BECLOMETHASONE DIPROPIONATE 80 MCG/ACT IN AERS: 2.0000 | INHALATION_SPRAY | Freq: Two times a day (BID) | RESPIRATORY_TRACT | Status: DC

## 2010-08-13 NOTE — Patient Instructions (Signed)
#  Shortness of breath -Your shortness of breath is due to multiple reasons - anemia, mild emphysema, and probably fitness issue contributing - your thyroid function was normal in July 2011 so I do not think that is an issue for shortness of breath -please start qvar 2 puff twice daily - we will set up overnight oxygen study  - depending on results you might need oxygen #Lung nodule - there is a small 4mm nodule in your right lower lobe lung - we think the risk for lung cancer is very low but you need a repeat ct chest in 1 year #Preop evaluation - ok to have surgery from lung stand point. Mild risk for pneumonia or breathing complications but expect to get unfit and more short of breath #followup - 4-6 weeks or sooner if needed

## 2010-08-14 ENCOUNTER — Telehealth: Payer: Self-pay | Admitting: *Deleted

## 2010-08-14 ENCOUNTER — Other Ambulatory Visit: Payer: Self-pay | Admitting: *Deleted

## 2010-08-14 ENCOUNTER — Telehealth: Payer: Self-pay | Admitting: Internal Medicine

## 2010-08-14 DIAGNOSIS — I2510 Atherosclerotic heart disease of native coronary artery without angina pectoris: Secondary | ICD-10-CM

## 2010-08-14 DIAGNOSIS — I4891 Unspecified atrial fibrillation: Secondary | ICD-10-CM

## 2010-08-14 DIAGNOSIS — I5032 Chronic diastolic (congestive) heart failure: Secondary | ICD-10-CM

## 2010-08-14 MED ORDER — SIMVASTATIN 40 MG PO TABS: 40.0000 mg | ORAL_TABLET | Freq: Every evening | ORAL | Status: DC

## 2010-08-14 NOTE — Telephone Encounter (Signed)
Notes Recorded by Marca Ancona, MD on 08/09/2010 at 9:45 PM LDL is a little higher than goal (< 70), would increase simvastatin to 40 mg daily with lipids/LFTs in 2 months. Notes Recorded by Marca Ancona, MD on 08/09/2010 at 9:44 PM BNP upper normal, other labs not particularly remarkable. Hemoglobin is higher than her baseline.  I talked with pt's daughter, Sheralyn Boatman. She verbalized understanding. Pt to return for fasting L/L 10/16/10.

## 2010-08-14 NOTE — Telephone Encounter (Signed)
Pt instruction sheet faxed to Dr. Noland Fordyce office with pt allergy noted., I will fax OV note once signed. Pt daughter aware. Carron Curie, CMA

## 2010-08-14 NOTE — Telephone Encounter (Signed)
Message copied by Katina Dung on Tue Aug 14, 2010  6:00 PM ------      Message from: [place], [place]      Created: Thu Aug 09, 2010  9:45 PM       LDL is a little higher than goal (< 70), would increase simvastatin to 40 mg daily with lipids/LFTs in 2 months.

## 2010-08-15 ENCOUNTER — Telehealth: Payer: Self-pay | Admitting: Cardiology

## 2010-08-15 ENCOUNTER — Encounter: Payer: Self-pay | Admitting: Internal Medicine

## 2010-08-15 DIAGNOSIS — Z01811 Encounter for preprocedural respiratory examination: Secondary | ICD-10-CM | POA: Insufficient documentation

## 2010-08-15 NOTE — Assessment & Plan Note (Signed)
Passive smoker. Isolated Low dlco on PFTs feb 2011 and April 2012. Visualzied on CT chest April 2012. Will start empiric qvar for now. Post hysterectomy, when I follow her I will check alpha 1 (sister has copd) and set her up with pulmonary rehab and depending on response to qvar, I will change to spiriva or continue it. Would avoid albuterol due to intolerance at PFt

## 2010-08-15 NOTE — Assessment & Plan Note (Signed)
April 2012: Small nodule identified within the right lower lobe measures 4.3 mm, image 32. Due to pasisve smoking and patient age, underlying uterine malignancy and her concern will get repeat ct chest in 1 year which is April 2013

## 2010-08-15 NOTE — Progress Notes (Signed)
Subjective:    Patient ID: [name], female    DOB: 28-Apr-1924, 77 y.o.   MRN: [id number]  Shortness of Breath This is a chronic (Patient of Dr. Shelle Iron but now re-establishing with Dr. Marchelle Gearing at her request.. Referred by Dr. Shirlee Latch. This is re-evaluation for dyspnea. She is  passive smoker and has sister with COPD) problem. The current episode started more than 1 year ago (Had told Dr. Shelle Iron that dyspnea started in feb 2010 but today she states that dyspnea started in sept 2010. It appers that she was very active up iuntil that point and then  started declining. She used to undertake daily walks but now with class 3 dyspnea). The problem occurs daily. The problem has been waxing and waning (Early on apparently she had some "passing out spells"  back in 2010. But now they are not present). Pertinent negatives include no abdominal pain, chest pain, claudication, coryza, ear pain, fever, headaches, hemoptysis, leg pain, leg swelling, neck pain, orthopnea, PND, rash, rhinorrhea, sore throat, sputum production, swollen glands, syncope, vomiting or wheezing. The symptoms are aggravated by any activity (Notes that bath, making bed, walking 2 laps in our office, all make her dyspneic. Few years ago she undertookd daily long walks. Family feels something changed). Associated symptoms comments: Had normal treadmill myoview last week but desaturated to 86% during that although today submaximal exercise in office she did not desaturat. Prior VQ x 2 is normal. Has chronic anemia hgb 10gm% since dyspnea started presumably due to uterine bleeding and is awating hysterectomy on 4/19. Right heart cath normal in 2011. CT chest today shows mild emphysema. Risk factors include no known risk factors. She has tried beta agonist inhalers and steroid inhalers (dulera and PPI  in Fall 2011 helped but she feels it was the dulera and not the PPI that helpd) for the symptoms. The treatment provided mild relief. There is no  history of allergies, aspirin allergies, asthma, bronchiolitis, CAD, chronic lung disease, DVT, a heart failure, PE, pneumonia or a recent surgery.      Review of Systems  Constitutional: Negative.  Negative for fever.  HENT: Negative.  Negative for ear pain, sore throat, rhinorrhea and neck pain.   Eyes: Negative.   Respiratory: Positive for shortness of breath. Negative for hemoptysis, sputum production and wheezing.   Cardiovascular: Negative for chest pain, orthopnea, claudication, leg swelling, syncope and PND.  Gastrointestinal: Negative.  Negative for vomiting and abdominal pain.  Genitourinary: Positive for vaginal bleeding and menstrual problem. Negative for frequency, flank pain and genital sores.  Musculoskeletal: Negative.   Skin: Negative for rash.  Neurological: Negative.  Negative for headaches.  Hematological: Negative.   Psychiatric/Behavioral: Negative.        Objective:   Physical Exam  Vitals reviewed. Constitutional: She is oriented to person, place, and time. She appears well-developed and well-nourished. No distress.       overwegiht  HENT:  Head: Normocephalic and atraumatic.  Right Ear: External ear normal.  Left Ear: External ear normal.  Mouth/Throat: Oropharynx is clear and moist. No oropharyngeal exudate.  Eyes: Conjunctivae and EOM are normal. Pupils are equal, round, and reactive to light. Right eye exhibits no discharge. Left eye exhibits no discharge. No scleral icterus.  Neck: Normal range of motion. Neck supple. No JVD present. No tracheal deviation present. No thyromegaly present.  Cardiovascular: Normal rate, regular rhythm, normal heart sounds and intact distal pulses.  Exam reveals no gallop and no friction rub.   No  murmur heard. Pulmonary/Chest: Effort normal and breath sounds normal. No respiratory distress. She has no wheezes. She has no rales. She exhibits no tenderness.  Abdominal: Soft. Bowel sounds are normal. She exhibits no  distension and no mass. There is no tenderness. There is no rebound and no guarding.  Musculoskeletal: Normal range of motion. She exhibits no edema and no tenderness.  Lymphadenopathy:    She has no cervical adenopathy.  Neurological: She is alert and oriented to person, place, and time. She has normal reflexes. No cranial nerve deficit. She exhibits normal muscle tone. Coordination normal.       Flat affect  Skin: Skin is warm and dry. No rash noted. She is not diaphoretic. No erythema. No pallor.  Psychiatric: She has a normal mood and affect. Her behavior is normal. Judgment and thought content normal.          Assessment & Plan:

## 2010-08-15 NOTE — Assessment & Plan Note (Signed)
Low risk for pulmonary complications from uterine surgery but risk includes pneumonia, atelectasis, and worsening hypxoemia. Would advice atrovent and steroid nebs post op, dvt prophylaxis, early ambulation and aggressive incentive spirometry. Expect deconditioning postop

## 2010-08-15 NOTE — Telephone Encounter (Signed)
I talked with pt's daughter about recent lab results.

## 2010-08-15 NOTE — Assessment & Plan Note (Signed)
Dyspnea on exertion since mid-2010. Passive Smoker and sister with COPD  EVALUATION (so far):   - Pfts 06/12/2009 & 08/10/2010: Normal except for  Isolated reduction in DLCO; 69% and 58% (uncorr. for hgb) - Anemia NOS: Hgb July 2011: 10gm% and April 2012: 10.6gm% - Normal right heart cath with mPAP 23, PCWP 11 and normal RAP  (feb 2011) - Non-obst CAD on left heart cath 40-50% lesions (feb 2011) followed by normal Myoview-ETT (March 2012) - Normal BNP 99 on 08/09/10 -Normal or Low prob VQ scan - May 2009 and July 2011 - Normal TSH - July 2011 - Desaturation with exerrtion ti 86% on RA during treadmill Myoview noted march/april 2012 but not in pulmonary office for submaximal exertion of 155 feet x 3 laps on 08/13/2010 - CT chest April 2012: Mild emphysema present. No fibrosis  THERAPEUTIC TRIALS (so far) - Improved partially with PPI vs MDI dulera in summer-fall 2011 (subjectively she felt it was dulera and not ppi)  ASSESSMENT I strongly suspect that isolated low diffusion capacity in her PFTs is from emphysema (due to passive smoking) and anemia. This can definitely account atleast partially for her class 3 dyspnea. I also strongly suspect deconditioning and being overweight as other possibly etiologies.  I douut any thyroid related myopathy because TSH was normal in summer 2011. There is no evidence for PE, pulmonary hypertension  in history or tests. IT appears coronary ischemia has been ruled out.   PLAN I have explained to patient, daughter and a friend with her my thoughts. They appear appreciative. I wil start her on QVAR for now to see if this helps with emphysema component (I do not want to do a long acting beta agonist due to intolerance noted to albuterol at PFT testing) atleast for a short while till she completes abdominal surgery. She will ultimately need pulmonary rehab as a therapeutic trial or a CPST. I would prefer she undergoes rehab first because I am sure she will be even more  deconditioned after hysterectomy. I will see her after her hysterectomy

## 2010-08-17 HISTORY — PX: ABDOMINAL HYSTERECTOMY: SHX81

## 2010-09-04 ENCOUNTER — Encounter: Payer: Self-pay | Admitting: Cardiology

## 2010-09-04 ENCOUNTER — Ambulatory Visit (INDEPENDENT_AMBULATORY_CARE_PROVIDER_SITE_OTHER): Payer: Medicare Other | Admitting: Cardiology

## 2010-09-04 VITALS — BP 143/71 | HR 64 | Resp 20 | Ht 61.0 in | Wt 140.0 lb

## 2010-09-04 DIAGNOSIS — I5032 Chronic diastolic (congestive) heart failure: Secondary | ICD-10-CM

## 2010-09-04 DIAGNOSIS — I4891 Unspecified atrial fibrillation: Secondary | ICD-10-CM

## 2010-09-04 DIAGNOSIS — R0602 Shortness of breath: Secondary | ICD-10-CM

## 2010-09-04 DIAGNOSIS — I2510 Atherosclerotic heart disease of native coronary artery without angina pectoris: Secondary | ICD-10-CM

## 2010-09-04 DIAGNOSIS — R0989 Other specified symptoms and signs involving the circulatory and respiratory systems: Secondary | ICD-10-CM

## 2010-09-04 DIAGNOSIS — I509 Heart failure, unspecified: Secondary | ICD-10-CM

## 2010-09-04 LAB — BASIC METABOLIC PANEL
CO2: 26 mEq/L (ref 19–32)
Calcium: 9 mg/dL (ref 8.4–10.5)
Chloride: 106 mEq/L (ref 96–112)
Sodium: 142 mEq/L (ref 135–145)

## 2010-09-04 LAB — BRAIN NATRIURETIC PEPTIDE: Pro B Natriuretic peptide (BNP): 81 pg/mL (ref 0.0–100.0)

## 2010-09-04 MED ORDER — FUROSEMIDE 20 MG PO TABS: 20.0000 mg | ORAL_TABLET | Freq: Every day | ORAL | Status: DC

## 2010-09-04 MED ORDER — POTASSIUM CHLORIDE CRYS ER 20 MEQ PO TBCR: 20.0000 meq | EXTENDED_RELEASE_TABLET | Freq: Every day | ORAL | Status: DC

## 2010-09-04 NOTE — Patient Instructions (Addendum)
Take Lasix(furosemide) 20mg  twice a day for 2 days, the decrease to 20mg  daily.  Take KCL(potassium) 20 mEq daily.  Lab today--BMP/BNP 428.32  786.05  427.31  Fasting lab in June --lipid profile / liver profile -this is scheduled for June 19 after 8:30 in the morning.  Schedule an appt to see Dr Shirlee Latch in 1 month.

## 2010-09-05 NOTE — Progress Notes (Signed)
PCP: Dr. Jacky Kindle   77 yo with history of paroxysmal atrial fibrillation, nonobstructive CAD, and chronic exertional dyspnea presents for cardiology followup. Her main problem recently has been exertional dyspnea. She has had quite significant shortness of breath for 1.5-2 years now. There was a definite change in her symptomatology at that time. Currently, she is short of breath walking around her house, dressing, taking a bath.  This has been worse since discharge after her hysterectomy for endometrial cancer in 4/12. No exertional chest pain. She has not had any episodes of tachypalpitations/irregular heart rate. No lightheadedness/syncope.   Patient has had an extensive workup for dyspnea. She had a right and left heart cath in 2/11 that showed nonobstructive CAD as well as normal pulmonary artery pressure and normal wedge pressure. Echo showed normal LV systolic function. She had a V/Q scan in 7/11 with no evidence for PE. She never smoked. She was seen by pulmonology and it was thought that reflux may be a cause of her symptoms. She was started on bid Protonix with some improvement but really minimal. She recently was in our office for ETT-myoview as a pre-operative test before surgery for endometrial cancer. There was no evidence for ischemia or infarction on perfusion images or ECG but the patient's oxygen saturation dropped from 96% at rest to 83% at peak exertion. She then had a high-resolution chest CT showing no significant interstitial lung disease but there was evidence for emphysema.  PFTs showed normal spirometry but there was decreased DLCO. She saw pulmonology again and it was thought that she could have a component of emphysema from passive smoking, and she was started on a beclomethasone inhaler.  This did initially seem to help some, but as above, symptoms worsened after recent surgery.   Oxygen saturation 98% at rest today.   ECG: NSR, T wave inversion in AVL  Labs (4/12): hgb 10.6,  LDL 84, HDL 68, BNP 98  PMH:  1. Atrial fibrillation: Paroxysmal. She was on dronedarone briefly but it was stopped. She is on coumadin.  2. CHF: Diastolic. Echo (2/11) with mild LVH, EF 65-70%, PA systolic pressure 35 mmHg.  3. Carotid stenosis: right CEA in 2011.  4. Endometrial cancer s/p hysterectomy 4/12 5. HTN  6. Anemia  7. GERD/hiatal hernia  8. Hypothyroidism  9. TIA  10. CAD: LHC (2/11) with 40% LAD stenosis, 50% mCFX stenosis. ETT-myoview (3/12): 5:31 exercise, no ECG changes, oxygen saturation dropped from 96% at rest to 83% with exertion, no ischemia or infarction on perfusion images.  11. Dyspnea with exertion: Extensive workup in 2011. Echo and left heart cath as above. Right heart cath with mean PA pressure 23 mmHg and PCWP 11 mmHg. V/Q scan (7/11) low probability. PFTs (2/11) normal except for mildly decreased DLCO. ETT-myoview as above. O2 sats dropped with exercise. Patient was evaluated by pulmonology, thought to have GERD as cause of dyspnea.  CT chest without contrast (high resolution) in 4/12 showed emphysema but no interstitial lung disease.  PFTs (4/12) with normal spirometry but decreased DLCO.  Dyspnea thought to be multifactorial with emphysema, deconditioning, and anemia playing a role.   SH: Married, never smoked, retired. Lives in [place]. Daughter is involved in her care.   FH: CAD   ROS: All systems reviewed and negative except as per HPI.   Current Outpatient Prescriptions  Medication Sig Dispense Refill  . aspirin 81 MG tablet Take 81 mg by mouth daily.        . beclomethasone (QVAR)  80 MCG/ACT inhaler Inhale 2 puffs into the lungs 2 (two) times daily.  120 Act  0  . bisoprolol (ZEBETA) 5 MG tablet Take 5 mg by mouth daily.        Marland Kitchen diltiazem (CARDIZEM CD) 240 MG 24 hr capsule Take 240 mg by mouth daily.        . iron polysaccharides (NIFEREX) 150 MG capsule Take 150 mg by mouth 2 (two) times daily.        Marland Kitchen levothyroxine (SYNTHROID, LEVOTHROID) 50  MCG tablet Take 50 mcg by mouth daily.        . Melatonin 3-10 MG TABS Take 1 tablet by mouth every evening.        . Mometasone Furo-Formoterol Fum (DULERA) 100-5 MCG/ACT AERO Inhale 1 puff into the lungs as needed.        . nitroGLYCERIN (NITROSTAT) 0.4 MG SL tablet Place 0.4 mg under the tongue every 5 (five) minutes as needed.        . pantoprazole (PROTONIX) 40 MG tablet Take 40 mg by mouth 2 (two) times daily.       . simvastatin (ZOCOR) 40 MG tablet Take 1 tablet (40 mg total) by mouth every evening.  30 tablet  6  . warfarin (COUMADIN) 5 MG tablet Take 5 mg by mouth daily. As directed per Coumadin clinic       . furosemide (LASIX) 20 MG tablet Take 1 tablet (20 mg total) by mouth daily.  30 tablet  6  . potassium chloride SA (KLOR-CON M20) 20 MEQ tablet Take 1 tablet (20 mEq total) by mouth daily.  30 tablet  6    BP 143/71  Pulse 64  Resp 20  Ht 5\' 1"  (1.549 m)  Wt 140 lb (63.504 kg)  BMI 26.45 kg/m2  SpO2 98% General: NAD  Neck: JVP 8-9 cm, no thyromegaly or thyroid nodule.  Lungs: Clear to auscultation bilaterally with normal respiratory effort.  CV: Nondisplaced PMI. Heart regular S1/S2, no S3/S4, no murmur. No peripheral edema. No carotid bruit. Normal pedal pulses.  Abdomen: Soft, nontender, no hepatosplenomegaly, no distention.  Neurologic: Alert and oriented x 3.  Psych: Normal affect.  Extremities: No clubbing or cyanosis.

## 2010-09-05 NOTE — Assessment & Plan Note (Signed)
S/p right CEA, follows with [facility].  

## 2010-09-05 NOTE — Assessment & Plan Note (Addendum)
Patient has had about 1.5 years of significant exertional dyspnea (NYHA class III symptoms). Cardiac workup has showed nonobstructive CAD and normal filling pressures on right and left heart cath.  Echo showed preserved EF.  Recent myoview showed no ischemia or infarction.  BNP was in the high normal range at last appointment.  Pulmonary workup showed unremarkable V/Q scan last summer. High resolution CT showed no interstitial lung disease but she did have evidence for emphysema.  Spirometry was normal on PFTs but DLCO was decreased.  Though she has never smoked, it was thought that she could have had passive exposure leading to some emphysema.  Increased exertional dyspnea since surgery may be due in part to being sedentary following operation.  However, she does appear to have more volume on exam, possibly due to IV fluid around the time of surgery.  I think that her dyspnea is multifactorial: emphysema, deconditioning, probably contribution from anemia, and now some mild diastolic CHF.  - Start Lasix 20 mg daily + KCl with BMET/BNP today.  - Plan for overnight oximetry per Dr. Marchelle Gearing. - Continue beclomethasone inhaler, ? add Spiriva (per Dr. Marchelle Gearing) - Pulmonary rehab (per Dr. Marchelle Gearing) - Consider trial of holding bisoprolol given theoretical potential to cause some bronchospasm though probably unlikely as it is highly beta-1 selective.

## 2010-09-05 NOTE — Assessment & Plan Note (Signed)
Nonobstructive on recent cath.  Continue ASA 81 mg daily and statin.  Goal LDL < 70.  She is a bit above this goal.  Encourage diet and exercise.

## 2010-09-05 NOTE — Assessment & Plan Note (Signed)
Paroxysmal.  She is in sinus rhythm now.  No recent atrial fibrillation-type symptoms.  She is no longer taking dronedarone, and it does not seem like she really needs it.  She is on coumadin.

## 2010-09-10 ENCOUNTER — Ambulatory Visit (INDEPENDENT_AMBULATORY_CARE_PROVIDER_SITE_OTHER): Payer: Medicare Other | Admitting: Internal Medicine

## 2010-09-10 ENCOUNTER — Encounter: Payer: Self-pay | Admitting: Internal Medicine

## 2010-09-10 VITALS — BP 120/62 | HR 73 | Temp 98.0°F | Ht 62.0 in | Wt 137.8 lb

## 2010-09-10 DIAGNOSIS — J984 Other disorders of lung: Secondary | ICD-10-CM

## 2010-09-10 DIAGNOSIS — R0602 Shortness of breath: Secondary | ICD-10-CM

## 2010-09-10 DIAGNOSIS — R0989 Other specified symptoms and signs involving the circulatory and respiratory systems: Secondary | ICD-10-CM

## 2010-09-10 DIAGNOSIS — R911 Solitary pulmonary nodule: Secondary | ICD-10-CM

## 2010-09-10 DIAGNOSIS — R0600 Dyspnea, unspecified: Secondary | ICD-10-CM

## 2010-09-10 NOTE — Progress Notes (Signed)
  Subjective:    Patient ID: [name], female    DOB: Aug 14, 1923, 77 y.o.   MRN: [id number]  HPI fOLLOWUP DYSPNEA. Since last visit, has had abdominal surgery. Benign per daughter and friend. No malignancy. No postoperative pulmonary complications. Using qvar. Still with dyspnea on exertion. ? Mild improvement after QVAR. No new issues. She is keen to do CPST. Family still very concerned about etiology for dyspnea   Review of Systems  Constitutional: Negative for fever and unexpected weight change.  HENT: Negative for ear pain, nosebleeds, congestion, sore throat, rhinorrhea, sneezing, trouble swallowing, dental problem, postnasal drip and sinus pressure.   Eyes: Negative for redness and itching.  Respiratory: Negative for cough, chest tightness, shortness of breath and wheezing.   Cardiovascular: Negative for palpitations and leg swelling.  Gastrointestinal: Negative for nausea and vomiting.  Genitourinary: Negative for dysuria.  Musculoskeletal: Negative for joint swelling.  Skin: Negative for rash.  Neurological: Negative for headaches.  Hematological: Does not bruise/bleed easily.  Psychiatric/Behavioral: Negative for dysphoric mood. The patient is not nervous/anxious.        Objective:   Physical Exam    Physical Exam  Vitals reviewed. Constitutional: She is oriented to person, place, and time. She appears well-developed and well-nourished. No distress.       overwegiht  HENT:  Head: Normocephalic and atraumatic.  Right Ear: External ear normal.  Left Ear: External ear normal.  Mouth/Throat: Oropharynx is clear and moist. No oropharyngeal exudate.  Eyes: Conjunctivae and EOM are normal. Pupils are equal, round, and reactive to light. Right eye exhibits no discharge. Left eye exhibits no discharge. No scleral icterus.  Neck: Normal range of motion. Neck supple. No JVD present. No tracheal deviation present. No thyromegaly present.  Cardiovascular: Normal rate, regular  rhythm, normal heart sounds and intact distal pulses.  Exam reveals no gallop and no friction rub.   No murmur heard. Pulmonary/Chest: Effort normal and breath sounds normal. No respiratory distress. She has no wheezes. She has no rales. She exhibits no tenderness.  Abdominal: Soft. Bowel sounds are normal. She exhibits no distension and no mass. There is no tenderness. There is no rebound and no guarding. HOWEVER, HAS OPEN WOUND IN LOWER PART HEALING BY SECONDARY INTENTION Musculoskeletal: Normal range of motion. She exhibits no edema and no tenderness.  Lymphadenopathy:    She has no cervical adenopathy.  Neurological: She is alert and oriented to person, place, and time. She has normal reflexes. No cranial nerve deficit. She exhibits normal muscle tone. Coordination normal.       Flat affect  Skin: Skin is warm and dry. No rash noted. She is not diaphoretic. No erythema. No pallor.  Psychiatric: She has a normal mood and affect. Her behavior is normal. Judgment and thought content normal.    Assessment & Plan:

## 2010-09-10 NOTE — Patient Instructions (Signed)
Please talk to your surgeon and let us know if it is safe and okay for you to bike exercise test (12 minutes of hard bikiing on stationary bike) We will have the overnight oxygen study done Continue your qvar as before As soon as the surgeon clears  You, we will do to bike stress test and then I will see you Please be in touch

## 2010-09-11 NOTE — H&P (Signed)
HISTORY AND PHYSICAL EXAMINATION   August 01, 2009   Re:  [name], [name]             DOB:  October 07, 1923   CHIEF COMPLAINT:  Severe right internal carotid stenosis with recent  syncopal episode.   HISTORY OF PRESENT ILLNESS:  This 77 year old female patient who has  been very healthy until the last 6 months when she has had increased  fatigue and dyspnea on exertion.  She recently had a syncopal episode  and this required hospitalization and ultimately had cardiac  catheterization performed by Dr. Charlies Constable which revealed  nonobstructive coronary disease with a good ejection fraction.  She also  had a pulmonary consultation by Dr. Marcelyn Bruins in the recent past  which did not reveal any severe pulmonary disease.  Energy level  continues to be poor at times according to her family.  She did have a  remote TIA many years ago which consisted of some slurred speech but  this did not recur.  During her evaluation at [facility] she was  found have a 90% right internal carotid stenosis with no flow reduction  in the left internal carotid.  She was referred for further evaluation.  She has no history of stroke.   CHRONIC MEDICAL PROBLEMS:  1. Coronary artery disease - nonobstructive.  2. Hypertension.  3. Brief episode of atrial fibrillation during recent cardiac cath      which converted to normal sinus rhythm.  She is currently on      Coumadin.  4. History of urinary tract infections.   PAST SURGICAL HISTORY:  None.   FAMILY HISTORY:  Positive for coronary artery disease in a sister and  brother.  Negative for diabetes and stroke.   SOCIAL HISTORY:  She is married, has 7 children and is retired.  She  does not use tobacco or alcohol.   REVIEW OF SYSTEMS:  Positive for dyspnea on exertion, constipation,  urinary frequency recently, pain in the legs with ambulation and a  remote history of phlebitis, arthritis, joint pain, muscle pain.  She  has had some  recent left ankle discomfort and bilateral edema and is on  diuretics.  All other systems in the review of systems are negative.   PHYSICAL EXAMINATION:  Blood pressure 142/78, heart rate 55, temperature  98.  General:  She is a well-developed, well-nourished elderly female  who is in no apparent distress.  She is alert and oriented x3.  HEENT:  Normal.  EOMs intact.  Chest:  Clear to auscultation.  No rhonchi or  wheezing.  Cardiovascular:  Regular rate and rhythm.  No murmurs heard.  There is a harsh carotid bruit over the right carotid bifurcation.  No  bruit on the left.  She has no severe edema bilaterally but does have 1+  edema.  She has 3+ femoral, popliteal and dorsalis pedis pulses  palpable.  Abdomen:  Soft, nontender with no masses.  Musculoskeletal:  Exam is free of major deformities.  Neurologic:  Normal.  Skin:  Free of  rashes.   Today I ordered a carotid duplex exam which I reviewed and interpreted.  She has a very high-grade right internal carotid stenosis in the 90%-95%  range, consistent with previous studies.  She has no flow reduction on  the left side.   IMPRESSION:  Severe right internal carotid stenosis with recent episode  of syncope and nonobstructive coronary artery disease.  Plan is to admit  the patient  on April 8 for right carotid endarterectomy.  Risks and  benefits have been thoroughly discussed.  We will discontinue her  Coumadin today.  She will be seeing Dr. Juanda Chance tomorrow and Dr. Jacky Kindle  today for possible urinary tract infection.  Surgery is scheduled for  Friday, April 8.     [name], M.D.  Electronically Signed   JDL/MEDQ  D:  08/01/2009  T:  08/02/2009  Job:  [id number]   cc:   Everardo Beals. Juanda Chance, MD, [facility]  Geoffry Paradise, M.D.

## 2010-09-11 NOTE — Assessment & Plan Note (Signed)
OFFICE VISIT   [name], [name]  DOB:  1924-02-18                                       03/27/2010  WRUEA#:[id number]   The patient returns 6 months post right carotid endarterectomy done for  severe but asymptomatic right internal carotid stenosis.  She had  suffered a near syncopal type spell preoperatively but has had no  symptoms postoperatively such as hemiparesis, aphasia, amaurosis fugax,  diplopia, blurred vision or syncope.  She is swallowing well, has no  hoarseness.   CHRONIC MEDICAL PROBLEMS:  1. Coronary artery disease.  2. Atrial fib on chronic Coumadin.  3. Hypertension.  4. GERD.   FAMILY HISTORY:  Positive for coronary artery disease in a sister and  brother, negative for diabetes and stroke.   REVIEW OF SYSTEMS:  Denies any chest pain, dyspnea on exertion.  Does  have easy fatigability, no lower extremity claudication, denies  productive cough, bronchitis, asthma, wheezing, musculoskeletal  symptoms.  All other systems are negative in review of systems.   PHYSICAL EXAMINATION:  Vital signs:  Blood pressure 135/72, heart rate  70, respirations 14.  General:  She is well-developed, well-nourished  female in no apparent distress, alert and oriented x3.  HEENT:  Normal  for age.  EOMs intact.  Lungs:  Clear to auscultation.  No rhonchi or  wheezing.  Cardiovascular:  Regular rhythm, no murmurs.  Carotid pulses  3+, no bruits.  Abdomen:  Soft, nontender with no masses.  Musculoskeletal:  Free of major deformities.  Neurological:  Normal.  Skin:  Free of rashes.  Lower extremity:  Reveals 3+ femoral and  dorsalis pedis pulses bilaterally.   Today I ordered a carotid duplex exam which I reviewed and interpreted.  She has no evidence of restenosis in the right internal carotid artery  and her left internal carotid has a very mild 30% stenosis.   I am very pleased with her result and will see her in 6 months with  followup carotid  duplex exam unless she develops any new symptoms in the  interim.     [name], M.D.  Electronically Signed   JDL/MEDQ  D:  03/27/2010  T:  03/27/2010  Job:  [id number]

## 2010-09-11 NOTE — Assessment & Plan Note (Signed)
OFFICE VISIT   NASIAH, POLINSKY  DOB:  [birth year]                                       08/22/2009  FAOZH#:[id number]   The patient returns for initial followup 2 weeks post right carotid  endarterectomy for severe right internal carotid stenosis.  She had one  syncopal episode a month prior to her evaluation at which time her  severe stenosis was detected but she had no history of stroke or  lateralizing signs.  She denies any hemiparesis, aphasia, amaurosis  fugax, diplopia, blurred vision or syncope since her surgery.  She is  swallowing well and has no hoarseness.  She is taking one aspirin per  day.   PHYSICAL EXAM:  Blood pressure 137/79, heart rate 67, temperature 98.  Right neck incision is healing nicely.  She has an excellent carotid  pulse at 3+ and no audible bruit.  Neurologic exam is normal.   I am very pleased with her early result.  I encouraged her to increase  her activity as tolerated.  She will return to see me in 6 months and  followup carotid duplex exam at that time unless she develops any  neurologic symptoms in the interim.     [name], M.D.  Electronically Signed   JDL/MEDQ  D:  08/22/2009  T:  08/23/2009  Job:  [id number]

## 2010-09-11 NOTE — Procedures (Signed)
CAROTID DUPLEX EXAM   INDICATION:  Follow up carotid artery disease.   HISTORY:  Diabetes:  No.  Cardiac:  No.  Hypertension:  Yes.  Smoking:  No.  Previous Surgery:  No.  CV History:  TIA, asymptomatic now.  Amaurosis Fugax No, Paresthesias No, Hemiparesis No.                                       RIGHT             LEFT  Brachial systolic pressure:  Brachial Doppler waveforms:  Vertebral direction of flow:        Antegrade  DUPLEX VELOCITIES (cm/sec)  CCA peak systolic                   82  ECA peak systolic                   163  ICA peak systolic                   477  ICA end diastolic                   212  PLAQUE MORPHOLOGY:                  Mostly homogenous  PLAQUE AMOUNT:                      Severe  PLAQUE LOCATION:                    ICA   IMPRESSION:  1. Right-side-only study due to repeating.  2. Right internal carotid artery shows evidence of 80% to 99% stenosis      with a focal stenosis beginning at approximately 0.6 cm from the      bifurcation.   ___________________________________________  [name], M.D.   AS/MEDQ  D:  08/01/2009  T:  08/01/2009  Job:  [id number]

## 2010-09-11 NOTE — Procedures (Signed)
CAROTID DUPLEX EXAM   INDICATION:  Followup carotid artery disease.   HISTORY:  Diabetes:  No.  Cardiac:  Coronary artery disease.  Hypertension:  Yes.  Smoking:  No.  Previous Surgery:  Right carotid endarterectomy 08/04/2009.  CV History:  TIA, asymptomatic now.  Amaurosis Fugax No, Paresthesias No, Hemiparesis No                                       RIGHT             LEFT  Brachial systolic pressure:         135               137  Brachial Doppler waveforms:         Normal            Normal  Vertebral direction of flow:        Antegrade         Antegrade  DUPLEX VELOCITIES (cm/sec)  CCA peak systolic                   76                64  ECA peak systolic                   86                99  ICA peak systolic                   87                113  ICA end diastolic                   33                35  PLAQUE MORPHOLOGY:                  Heterogeneous     Heterogeneous  PLAQUE AMOUNT:                      Minimal           Minimal  PLAQUE LOCATION:                    ECA, CCA          CCA, ECA, ICA   IMPRESSION:  1. No evidence of restenosis, status post right carotid      endarterectomy.  2. Left internal carotid artery velocities are suggestive of a 1%-39%      stenosis.         ___________________________________________  [name], M.D.   EM/MEDQ  D:  03/27/2010  T:  03/27/2010  Job:  [id number]

## 2010-09-14 NOTE — H&P (Signed)
[place]  Patient:    [name], [name] Visit Number: [id number] MRN: [id number]          Service Type: MED Location: [phone] Attending Physician:  Mick Sell Dictated by:   Deanna Artis. Sharene Skeans, M.D. Admit Date:  10/20/2001 Discharge Date: 10/21/2001   CC:         Richard A. Jacky Kindle, M.D.   History and Physical  DATE OF BIRTH:  May 03, 1923  SERVICE:  Stroke service.  CHIEF COMPLAINT:  "Didnt feel good, tired, weak, falling to the left."  HISTORY OF THE PRESENT ILLNESS:  The patient was well today and went out to lunch with her family.  Around 1500 hours, she began feeling very tired and weak.  She had a call from her ex-daughter-in-law and told her that she was not feeling well.  The daughter-in-law, who is a Engineer, civil (consulting), assessed her and noted that the patient had perioral numbness (which I suspect may have been related to anxiety), but also appeared to have some weakness in her left leg and was falling to the left side with her eyes closed.  I was called at 6:40 p.m.  She arrived by car at [facility] at [phone].  I was called by the ER physician, Dr. Trudi Ida. Steinl, at 7:42 p.m.  He had assessed her and found no focal deficits but noted that she was quite hypertensive with a blood pressure of 194/89.  The patient was hospitalized on March 07, 2002 through March 10, 2002 on the stroke consult service.  She had a right brain TIA at that time with slurred speech and right arm weakness and numbness and blurred vision.  CT scan of the brain at that time showed a subcortical white matter disease. MRI showed pronounced small vessel white matter changes with no acute lesions. MRA showed a left vertebral that was dominant, all vessels were widely patent without stenosis or irregularity in the extracranial circulation and all intracranial vessels appeared to be normal as well.  Two-dimensional echocardiogram showed a  left ventricular ejection fraction of 55-65% with no wall abnormalities or hypokinesis of the left ventricle.  There was mild mitral annular calcification and also mitral regurgitation.  Dopplers showed no hemodynamically significant internal carotid artery stenosis and also bilateral vertebral antegrade flow.  Lipid panel:  Cholesterol 188, triglycerides 82, HDL 63, total cholesterol HDL 3.0, VLDL 16, LDL 109. Homocysteine 8.49.  The patient has done well.  Her blood pressure is high tonight but she says that it is usually much better than this.  REVIEW OF SYSTEMS:  CONSTITUTIONAL:  The patient has had good appetite, no problems with sleep and no problems with fever.  HEAD AND NECK:  No ear infection, cold, pharyngitis or sinusitis.  PULMONARY:  No dyspnea.  Her family notes that she seems to breathe a little bit harder at nighttime, but I see no evidence of respiratory distress, nor is there a productive cough.  GI: No nausea, vomiting or diarrhea.  MUSCULOSKELETAL:  The patient has had long-standing history of back pain but does not complain of it at this time. SKIN:  No rash.  HEMATOLOGIC:  No bleeding dyscrasias or anemia.  NEUROLOGIC: The patient has had no syncope, no seizures, no closed head injury.  She has not had dizziness, diplopia, dysarthria, dysphagia, tinnitus, syncope, vertigo or loss or bowel and bladder control.  Review of systems is otherwise negative.  PAST MEDICAL HISTORY:  Past medical history is  remarkable for hypothyroidism, hypertension and low back pain.  PAST SURGICAL HISTORY:  Tubal ligation.  CURRENT MEDICATIONS: 1. Toprol-XL 100 mg one per day. 2. Diovan 80 mg per day. 3. Naprosyn 500 mg twice a day. 4. Synthroid 50 mcg per day. 5. Aspirin 325 mg per day.  ALLERGIES TO MEDICINES:  None.  FAMILY HISTORY:  Mother died in her late 61s of hypertension and coronary artery disease.  Father died in his late 81s in a motor vehicle accident. There is  history of hypertension in the family.  No history of cerebrovascular accident, heart disease (except mother), cancer or diabetes mellitus.  SOCIAL HISTORY:  This is her second marriage; she has been married for 42 years.  No use of tobacco or alcohol.  She is a very active, vigorous woman.  PHYSICAL EXAMINATION:  VITAL SIGNS:  On examination tonight, blood pressure 190/90, resting pulse 62 and regular, respirations 18, temperature 97.7, pulse oximetry 99%.  ENT:  No signs of infection.  NECK:  No bruits.  LUNGS:  Clear.  HEART:  No murmurs.  Pulse is normal.  ABDOMEN:  Soft.  Bowel sounds normal.  EXTREMITIES:  No edema or cyanosis.  SKIN:  No lesion.  NEUROLOGIC:  Mental status:  The patient was awake, alert, attentive, appropriate.  No dysphasia or dyspraxia.  Cranial nerve examination:  Round, reactive pupils.  Normal fundi.  Full visual fields to double simultaneous stimuli.  Extraocular movements full and conjugate, okay and responses equal bilaterally.  Symmetric facial strength.  Midline tongue and uvula.  Air conduction equal to bone conduction (she wears hearing aids bilaterally). Motor examination:  Normal strength, tone and mass.  Good fine motor movements.  No pronator drift.  Sensation intact to cold, vibration, proprioception and stereoagnosis.  Cerebellar examination:  Good finger-to-nose and rapid repetitive movements.  Gait was normal.  She was able to walk on her heels and toes.  Tandem was a little shaky.  Romberg response was negative.  Deep tendon reflexes were diminished to absent.  She had bilateral flexor plantar responses.  IMPRESSION: 1. Transient ischemic attack, right brain, 435.8. 2. Hypertension without congestive heart failure, 404.10. 3. Hypothyroidism. 4. Low back pain, controlled with naproxen. 5. History of diffuse subcortical white matter disease from prior studies    would suggest a longstanding history of small vessel  atherosclerotic    disease.   I do not know whether the patient had a completed event tonight.  I suspect from her examination that she did not.  PLAN: 1. MRI of the brain to look for an acute stroke. 2. MRA, intracranial, to check to be certain there has been no change. 3. Carotid Doppler. 4. I do not think that further workup is necessary if these are normal.  I    would continue to work to lower her blood pressure over the long run.  I    have asked Dr. Pearletha Furl. Jacky Kindle to see the patient in the morning.  I      anticipate being able to discharge the patient home, once her workup is    complete.Dictated by:   Deanna Artis. Sharene Skeans, M.D. Attending Physician:  Mick Sell DD:  10/20/01 TD:  10/22/01 Job: [id number]

## 2010-09-14 NOTE — Consult Note (Signed)
[facility]  Patient:    [name] Visit Number: [id number] MRN: [id number]          Service Type: MED Location: [phone] Attending Physician:  [name] Dictated by:   [name], M.D. Proc. Date: 03/07/01 Admit Date:  03/07/2001 Discharge Date: 03/10/2001   CC:         [name], M.D.   Consultation Report  DATE OF BIRTH:  [birth year]  NOTE:  It should be noted that this patient is being seen at [name] and may be transferred to [name] which needs to be kept in mind for this record.  CHIEF COMPLAINT:  Left upper extremity numbness, slurred speech and blurred vision.  HISTORY OF PRESENT ILLNESS:  [name] is a 77 year old woman with a history of hypertension and hypothyroidism who had onset of diffuse dull unremitting headache this morning when she awakened.  This is unusual for her. She had two episodes lasting for minutes in duration of left upper extremity numbness proceeding from her shoulder extending down to her fingertips. Particularly numb in the fingertips.  The arm felt heavy.  When she rubbed her fingers together, however there is perhaps some tingling.  The first episode simply consisted of numbness.  The second was associated with dysarthria.  The patient had slurred speech and though she was not able to speak distinctly, she did not have dysphasia as was noted by Dr. Felipa Hammond. The patient also was somewhat incoordinated.  She also had difficulty recognizing her daughters voices and thought that she might have had a film over her left eye.  When I asked whether this could have been left visual field and described how she would have known that, she could not answer the question.  The patient did not have any nausea or vomiting.  She did not have diplopia, tinnitus, syncope, seizures, loss of bowel and bladder control.  REVIEW OF SYSTEMS:  The patient has  not been ill and has had no recent intercurrent infections in the HEAD, NECK, LUNGS, GI or GU.  She has not had fever, night sweats, change in appetite or sleep pattern.  She does not have diabetes.  She has not had prior atherosclerotic cardiovascular disease nor has she had peripheral vascular disease.  She has had a year-long history of hypertension.  No bleeding dyscrasias.  No known allergies to environmental stimuli, autoimmune or immune disorders.  No fractures.  She does have osteoarthritis of the lower spine.  She has not had any urinary tract infections, dysuria, hematuria.  She has not had depression.  She has not had other neurologic complaints in the past including strokes, seizures.  She has had a few migraines in the past.  Review of systems is otherwise negative.  PAST MEDICAL HISTORY: 1. Hypertension (one year duration). 2. Low back pain. 3. Hypothyroidism.  PAST SURGICAL HISTORY:  Tubal ligation.  CURRENT MEDICATIONS: 1. Toprol XL 100 per day. 2. Synthroid 50 mcg per day.  ALLERGIES:  None known.  FAMILY HISTORY:  Mother died in her late 30s of hypertension and coronary artery disease.  Father died in his 30s of motor vehicle accident.  There is a family history of hypertension in mothers family.  No history of strokes or heart disease, cancer or diabetes.  SOCIAL HISTORY:  The patient has been married (second marriage) for 42 years. She does not use tobacco or alcohol.  She is an active vigorous woman.  In September 2002 she had triglyceride of 235, HDL 71, LDL 142.  PHYSICAL EXAMINATION:  GENERAL:  This is a very pleasant woman.  VITAL SIGNS:  Blood pressure initially 196/97, currently blood pressure is 174/80, resting pulse 68, respirations 18, pulse oximetry was 100% on 2 liters of oxygen.  HEENT:  No signs of infection.  NECK:  Supple, full range of motion, no cranial or cervical bruits.  LUNGS:  Clear to auscultation.  HEART:  No murmurs,  pulses normal.  ABDOMEN:  Soft and nontender, bowel sounds normal.  EXTREMITIES:  Well formed without edema, cyanosis, alterations in tone or tight heel cords.  MENTAL STATUS:  The patient was awake, alert, attentive and appropriate.  No dysphasia or dyspraxia.  CRANIAL NERVES:  Round and reactive pupils, normal fundi, visual fields full to double simultaneous stimuli, extraocular movements full and conjugate okay and responsive and equal bilaterally.  Visual acuity was not tested. Symmetric facial strength.  The patient possibly had a mild left central seventh versus facial asymmetry.  Certainly when she smiled her face was symmetric.  She was able to protrude her tongue and elevate her uvula midline.  Air conduction greater than bone conduction.  However, she wears hearing aids.  Left is missing because the battery is dead.  NEUROLOGIC:  Motor exam showed normal strength, tone and mass.  Good fine motor movements.  No pronator drift.  Sensation intact to cold, vibration and stereoagnosis.  There may be a mild stocking neuropathy in her legs. Proprioception was intact in hands and feet.  Deep tendon reflexes were symmetric and virtually absent.  The patient had bilateral flexor plantar responses.  Gait was slightly broad based, she could not tandem.  She had mild retropulsion with Romberg response.  She could get up on her heels and toes as long as she was supported.  IMPRESSION: 1. I suspect that this person had a subcortical right brain transient ischemic    attack.  I am not certain with the blurred vision in the left eye whether    this was a left visual field process which would make more sense than if it    was a left eye problem which would have been contralateral to the brain    disturbance.  The patient seems to be better now.  She has some (435.8).  I    do not believe that this is vertebral basilar insufficiency although we    cannot absolutely rule that out.  2.  Hypertension without congestive heart failure, 404.10. 3. Organic gait disorder, 781.2.  The patient is a bit unsteady and I suspect that    we will find evidence of some subcortical white matter disease. 4. History of hypothyroidism.  We do not know how well treated this is, need    to check.  PLAN:  The patient needs a workup for her stroke including MRI, MRA, intra and extracranial, 2-D echocardiogram, and carotid Doppler.  She should be treated with aspirin unless she has further TIAs in which case she should be placed on heparin.  I will see her in follow up either at [facility] or [facility].  If she goes to [facility] she will be placed on our stroke consult service.  I appreciate the opportunity to see her and have discussed the case with the family and also Dr. Felipa Hammond. Dictated by:   [name], M.D. Attending Physician:  [name] DD:  03/07/01 TD:  03/08/01 Job: [id number]

## 2010-09-14 NOTE — Discharge Summary (Signed)
[place]  Patient:    [name], [name] Visit Number: [id number] MRN: [id number]          Service Type: MED Location: [id number] Attending Physician:  Beatris Ship Admit Date:  03/07/2001 Discharge Date: 03/10/2001                             Discharge Summary  DISCHARGE DIAGNOSES: 1. Right brain transient ischemic attack with dysarthria, gait disorder,    and blurred vision, resolved. 2. Essential hypertension, fair control. 3. Chronic low back pain secondary to degenerative disk disease and    lumbosacral arthritis.  HISTORY OF PRESENT ILLNESS:  Ms. Babula is a 77 year old white female presenting with several minutes of left upper extremity numbness, aphasia, imbalance, and headache lasting a variety of several hours on two to three different episodes.  She had no chest pain or shortness of breath.  She presented to the emergency room after taking aspirin and CT was negative for bleed.  She was admitted with presumed right brain TIA for further workup and management.  For details, see the dictated summary by my partner on the chart.  LABORATORY DATA:  Echocardiogram revealed normal left ventricular size, normal LV function, EF 55 to 65%.  No regional wall abnormalities.  Normal wall thickness.  Overall normal study and no source of embolus.  Homosysteine level was 8.49.  CBC; hemoglobin 10.9, hematocrit 31.5, white blood cell count 4.7, platelet count 239,000.  Chemistries; sodium 143, potassium 4.1, chloride 112, CO2 26, glucose 93, BUN 13, creatinine 0.8, calcium 8.7.  Cholesterol 188, triglycerides 82, HDL elevated at 63, LDL normal at 109.  CT scan revealed atrophy, small vessel changes, no acute change.  MRI and MRA revealed pronounced chronic small vessel changes, deep white matter, no acute stroke or reversible lesion.  Normal MR angiogram of the neck vessels, negative intracranial MRA large and medium size vessels.   EKG was normal sinus rhythm, no evidence of atrial fibrillation or arrhythmia.  Chest x-ray revealed no acute disease.  HOSPITAL COURSE:  The patient was admitted, hydrated, and placed at bed rest with total resolution of symptoms and no recurrence. Workup is as stated above with no evidence of extracranial or intracranial vascular disease.  There was evidence of very small vessel disease to be treated with aspirin.  Echo and carotid Dopplers were negative.  Neurologically she was normal throughout her hospitalization.  Diovan/Avapro was added for hypertensive control.  She was stable from a cardiopulmonary standpoint and remained totally normal neurologically.  The patient is discharged with a resolved right brain TIA on aspirin alone per neurology recommendations.  Hypertension will be followed up as an outpatient.  She is in improved condition.  DISCHARGE MEDICATIONS: 1. Aspirin 325 mg daily. 2. Diovan 80 mg daily. 3. Synthroid 0.05 q.d. 4. Toprol XL 100 mg daily.  FOLLOW-UP:  She is to follow up in one to two weeks. Attending Physician:  Beatris Ship DD:  03/10/01 TD:  03/10/01 Job: [id number]

## 2010-09-14 NOTE — H&P (Signed)
[facility]  Patient:    [name] Visit Number: [id number] MRN: [id number]          Service Type: EMS Location: ED Attending Physician:  Ilene Qua Dictated by:   Lennon Alstrom Felipa Eth, M.D. Admit Date:  03/07/2001 Discharge Date: 03/07/2001   CC:         Richard A. Jacky Kindle, M.D.  Deanna Artis. Sharene Skeans, M.D.   History and Physical  CHIEF COMPLAINT:  Left upper extremity numbness with speaking difficulties.  HISTORY OF PRESENT ILLNESS:  This is a 77 year old Caucasian female who presents with a three to five-minute episode x 2 this morning of left upper extremity numbness, questionable expressive aphasia, and imbalance preceded by headache upon arising several hours earlier.  She denies any strength deficits.  She denies any visual changes with the exception of a mild haze over the left eye, which is resolved.  She denies any dysphasia.  All of her symptoms have resolved.  She did take two aspirin en route to the emergency room this morning.  Her in the emergency room, CT was negative for bleeding or acute disease.  Labs are unremarkable with the exception of some mild anemia. The blood pressure has remained elevated since admission.  The headache has resolved with the use of Darvocet.  REVIEW OF SYSTEMS:  Negative for fevers, chills, chest pain, nausea, vomiting, and diaphoresis.  Positive for a several month history of dyspnea on exertion, but still walks approximately one mile per day with her husband.  The patient denies abdominal pain.  The patient has had recent low back pain worked up by Javier Docker, M.D., resulting in physical therapy evaluation and Bextra, which has resulted in alleviation of her discomfort.  The patient also has had several episodes of sinus headaches, but is not treated with any type of over-the-counter remedy.  PROBLEM LIST: 1. Hypertension. 2. Hypothyroidism with TSH in September of 2002 of 4.08. 3. Low  back pain. 4. Questionable seasonal allergic rhinitis. 5. Osteopenia. 6. Hyperlipidemia with a cholesterol of 235, HDL 71, and LDL 142 in September    of 2002.  MEDICATIONS: 1. Toprol XL 100 mg p.o. q.d. 2. Synthroid 50 mcg p.o. q.d.  ALLERGIES:  No known drug allergies.  SOCIAL HISTORY:  The patient is married for 42 years.  Denies any alcohol or tobacco abuse.  She is quite active at home.  FAMILY HISTORY:  Significant for her father having died of a motor vehicle accident at an early age and her mother who has had significant problems with hypertension and coronary artery disease.  PHYSICAL EXAMINATION:  In general, we have a pleasant Caucasian female who is well nourished and appropriate and in no apparent distress.  VITAL SIGNS:  Pulse 68 and regular, blood pressure 196/97, respirations 18 and nonlabored, oxygen saturation 100% on O2.  The patient is afebrile.  HEENT:  Head:  Normocephalic and atraumatic.  Eyes:  Extraocular movements are intact.  Sclerae anicteric.  Pupils are equal.  ENT:  There is no sinus tenderness.  There are no oropharyngeal lesions.  Tympanic membranes clear.  NECK:  Supple.  There is no thyromegaly.  No carotid bruits are appreciated. No JVD appreciated.  LYMPHATICS:  There is no cervical or axillary lymphadenopathy.  LUNGS:  Clear to auscultation bilaterally.  CARDIOVASCULAR:  Regular rate and rhythm without murmurs, rubs, or gallops appreciated.  ABDOMEN:  Soft, nontender, and nondistended abdomen.  Bowel sounds present throughout.  There is no hepatosplenomegaly.  GENITOURINARY:  Deferred.  RECTAL:  Deferred.  EXTREMITIES:  No clubbing, cyanosis, or edema.  Pulses 2+ in all four extremities.  NEUROLOGIC:  The patient is alert and oriented x 3.  Cranial nerves II-XII grossly intact.  Muscle strength is 5/5 with the restrictions of being bed bound.  There are no sensory deficit to light touch grossly.  There is no pronator  drift.  LABORATORY EVALUATION:  Sodium 139, potassium 4.3, BUN 19, creatinine 1.0, glucose 132, AST 24, ALT 29, alkaline phosphatase 91, total bilirubin 0.7, calcium 9.2, albumin 4.1.  White blood cell count 4.1, hemoglobin 11.7, hematocrit 33.7, platelet count 245, MCV 84.  The PTT is 32 seconds, PT 12.6 seconds, and INR 0.9.  The head CT is unremarkable by report for any bleeding or acute disease.  IMPRESSION:  We have a 77 year old Caucasian female with longstanding hypertension, hypothyroidism, and osteoarthritis, who presents with probable TIAs this morning and subacute on top of chronic dyspnea on exertion and questionably new onset mild anemia.  ASSESSMENT AND PLAN: 1. Transient ischemic attacks.  Will anticoagulate if the patient has    recurrent symptoms.  Otherwise will obtain carotid Dopplers, EKG    echocardiogram, and for timing of MRI and MRA to neurology consult with    Deanna Artis. Sharene Skeans, M.D.  Will obtain neurologic checks and place the    patient on telemetry for observation.  Will continue aspirin for the time    being and possibly Plavix in the future. 2. Dyspnea on exertion, which is subacute/chronic in nature.  Will again    follow up on EKG and chest x-ray, as well as echocardiogram.  Oxygen    saturation is satisfactory at the current time.  Will defer additional    work-up based on the above findings. 3. Anemia.  Will provide empiric proton pump inhibitor and obtain serial CBCs    and guaiac stools x 3 pending further evaluation. Dictated by:   Lennon Alstrom Felipa Eth, M.D. Attending Physician:  Ilene Qua DD:  03/07/01 TD:  03/08/01 Job: [id number]

## 2010-09-14 NOTE — Op Note (Signed)
[name], APPLE             ACCOUNT NO.:  [id number]   MEDICAL RECORD NO.:  [id number]          PATIENT TYPE:  AMB   LOCATION:  [facility]                         FACILITY:  [facility]   PHYSICIAN:  Maretta Bees. Vonita Moss, M.D.DATE OF BIRTH:  October 26, 1923   DATE OF PROCEDURE:  05/22/2005  DATE OF DISCHARGE:                                 OPERATIVE REPORT   PREOPERATIVE DIAGNOSIS:  History of gross hematuria and rule out carcinoma  in situ.   POSTOPERATIVE DIAGNOSIS:  History of gross hematuria and rule out carcinoma  in situ.   PROCEDURE:  Cystoscopy, bilateral retrograde pyelograms with interpretation.  Cold cup bladder biopsy.   SURGEON:  Dr. Larey Dresser.   ANESTHESIA:  General.   INDICATIONS:  This pleasant 78 year old lady had gross hematuria back in  November. At that time, she was on aspirin, Plavix and Naprosyn. CT scan  showed no obvious lesions to account for that. Cystoscopy revealed stellate  inflammatory looking lesions on the dome and she returned for repeat  cystoscopy at the end of December and these lesions were still present and I  scheduled her for further workup and biopsy. She has been off her aspirin  and Plavix for a month. She did not stop her Naprosyn but I believe the  procedure today can be done safely even though she is on Naprosyn especially  since she bled in the past while she was on all her anticoagulants and it  stopped spontaneously.   DESCRIPTION OF PROCEDURE:  The patient was brought to the operating room,  placed in lithotomy position. The external genitalia were prepped and draped  in the usual fashion. She was cystoscoped and the only abnormality was an  erythematous stellate appearing area on the right wall close to the dome.  There were no papillary tumors and there were no stones. I could not  identify the other smaller stellate lesion that was seen before.   Bilateral retrograde pyelograms were obtained using a cone-tip catheter  injecting  contrast using a ureteral catheter inserted by means of  cystoscopy. Fluoroscopic visualization revealed delicate normal ureters  without filling defects and delicate nonobstructed pyelocaliceal systems  with no filling defects or abnormalities.   Cold cup bladder biopsy forceps was utilized to biopsy this inflamed  stellate lesion that measured a little over a centimeter in size. The biopsy  site and surrounding mucosa was coagulated and fulgurated with the Bugbee  electrode. There was essentially no blood loss at all and there was  excellent hemostasis at the conclusion of the procedure. The bladder was  emptied, the scope removed and the patient sent to the recovery room in good  condition having tolerated the procedure well.      Maretta Bees. Vonita Moss, M.D.  Electronically Signed     LJP/MEDQ  D:  05/22/2005  T:  05/22/2005  Job:  [id number]   cc:   Geoffry Paradise, M.D.  Fax: [phone]

## 2010-09-20 ENCOUNTER — Telehealth: Payer: Self-pay | Admitting: Internal Medicine

## 2010-09-20 ENCOUNTER — Encounter: Payer: Self-pay | Admitting: Internal Medicine

## 2010-09-20 DIAGNOSIS — R0600 Dyspnea, unspecified: Secondary | ICD-10-CM

## 2010-09-20 NOTE — Assessment & Plan Note (Signed)
Dyspnea: unchanged  PLAN Please talk to your surgeon and let us know if it is safe and okay for you to bike exercise test (12 minutes of hard bikiing on stationary bike) We will have the overnight oxygen study done Continue your qvar as before As soon as the surgeon clears  You, we will do to bike stress test and then I will see you Please be in touch

## 2010-09-20 NOTE — Telephone Encounter (Signed)
I just did another phonbe note earlier today but I guess this answers those questions. OKay  A) yes please set up referral for pulmonary rehab for dyspnea, deconditioning, and emphysema on CT chest. It takes 6 weeks to get in; family should know this B) Followup in 2 months C) Between now and followup, anytime they get clearance at [facility] for CPST bike test - they should call and let us know

## 2010-09-20 NOTE — Telephone Encounter (Signed)
[name], Please call her and let her know [name] feels he wants abdominal wound healed and a clearance from surgeron that she can bike for 12 minutes from abdomen stand point. She is to call us once this happens to schedule CPST

## 2010-09-20 NOTE — Telephone Encounter (Signed)
Spoke with pt's daughter-in-law. She was calling to touch base with MR. She states that pt had surgery for her uterine CA and was advised by Dr. Noland Fordyce that she should not have stress test until after her incision heals. She states that she has appt for post op 1 month from today. She wants to know when next ov with MR should be and if he still wants to refer her to pulm rehab. Pls advise thanks!

## 2010-09-21 NOTE — Telephone Encounter (Signed)
LMTCBx1.Jennifer Castillo, CMA  

## 2010-09-21 NOTE — Telephone Encounter (Signed)
Spoke with [name] and notified of recs per MR. She verbalized understanding. States that she will let us know about stress test and if/when this can be done. Order sent to [facility] for rehab referral. ROV sched with MR for 11/23/10 at 1:30 pm.

## 2010-09-25 ENCOUNTER — Other Ambulatory Visit (INDEPENDENT_AMBULATORY_CARE_PROVIDER_SITE_OTHER): Payer: Medicare Other

## 2010-09-25 DIAGNOSIS — Z48812 Encounter for surgical aftercare following surgery on the circulatory system: Secondary | ICD-10-CM

## 2010-09-25 DIAGNOSIS — I6529 Occlusion and stenosis of unspecified carotid artery: Secondary | ICD-10-CM

## 2010-09-28 NOTE — Telephone Encounter (Signed)
LMTCBx2. Sobia Karger, CMA  

## 2010-10-01 ENCOUNTER — Telehealth: Payer: Self-pay | Admitting: Internal Medicine

## 2010-10-01 NOTE — Procedures (Unsigned)
CAROTID DUPLEX EXAM  INDICATION:  Follow up carotid artery disease, status post right CEA.  HISTORY: Diabetes:  No. Cardiac:  CAD. Hypertension:  Yes. Smoking:  No. Previous Surgery:  Right CEA, 08/04/2009. CV History:  TIA in the past, asymptomatic now. Amaurosis Fugax No, Paresthesias No, Hemiparesis No.                                      RIGHT             LEFT Brachial systolic pressure:         126               128 Brachial Doppler waveforms:         WNL               WNL Vertebral direction of flow:        Antegrade         Antegrade DUPLEX VELOCITIES (cm/sec) CCA peak systolic                   51                71 ECA peak systolic                   115               72 ICA peak systolic                   P = 54, D = 74    P = 79, D = 80 ICA end diastolic                   P = 12, D =25     P = 24, D = 26 PLAQUE MORPHOLOGY:                                    Calcific PLAQUE AMOUNT:                      None              Mild PLAQUE LOCATION:                                      Bifurcation/ICA  IMPRESSION: 1. Right internal carotid artery shows no evidence of restenosis,     status post carotid endarterectomy. 2. Left internal carotid artery shows no evidence of hemodynamically     significant stenosis, <40%.  Left bifurcation/proximal internal     carotid artery, calcific plaque was noted. 3. No significant changes from the previous study of 03/27/2010.  ___________________________________________ Quita Skye. Hart Rochester, M.D.  AS/MEDQ  D:  09/25/2010  T:  09/25/2010  Job:  [id number]

## 2010-10-01 NOTE — Telephone Encounter (Signed)
Jen, Please call patient  REviewed ONO from 09/20/10  Spent 57 min below pulse ox 89% and below pulse ox 32 min. So, she qualifies for 1-2 L [place] at night continous. Please arrange this. At fu I can discuss sleep study with them

## 2010-10-04 NOTE — Telephone Encounter (Signed)
Spoke with pt daughter and she states they will call when pt is healed from surgery.  Carron Curie, CMA

## 2010-10-04 NOTE — Telephone Encounter (Signed)
Spoke with pt daughter and she states that she wants to discuss with the pt the need for oxygen and will call us back and let us know what the pt decides. Carron Curie, CMA

## 2010-10-06 ENCOUNTER — Other Ambulatory Visit: Payer: Self-pay | Admitting: Internal Medicine

## 2010-10-08 ENCOUNTER — Encounter: Payer: Self-pay | Admitting: Internal Medicine

## 2010-10-09 ENCOUNTER — Ambulatory Visit: Payer: Medicare Other | Admitting: Cardiology

## 2010-10-16 ENCOUNTER — Ambulatory Visit: Payer: Medicare Other | Admitting: Cardiology

## 2010-10-16 ENCOUNTER — Other Ambulatory Visit: Payer: Medicare Other | Admitting: *Deleted

## 2010-10-23 ENCOUNTER — Other Ambulatory Visit (INDEPENDENT_AMBULATORY_CARE_PROVIDER_SITE_OTHER): Payer: Medicare Other | Admitting: *Deleted

## 2010-10-23 ENCOUNTER — Ambulatory Visit (INDEPENDENT_AMBULATORY_CARE_PROVIDER_SITE_OTHER): Payer: Medicare Other | Admitting: Cardiology

## 2010-10-23 ENCOUNTER — Encounter: Payer: Self-pay | Admitting: Cardiology

## 2010-10-23 DIAGNOSIS — I509 Heart failure, unspecified: Secondary | ICD-10-CM

## 2010-10-23 DIAGNOSIS — I5032 Chronic diastolic (congestive) heart failure: Secondary | ICD-10-CM

## 2010-10-23 DIAGNOSIS — I10 Essential (primary) hypertension: Secondary | ICD-10-CM

## 2010-10-23 DIAGNOSIS — I2510 Atherosclerotic heart disease of native coronary artery without angina pectoris: Secondary | ICD-10-CM

## 2010-10-23 DIAGNOSIS — I6529 Occlusion and stenosis of unspecified carotid artery: Secondary | ICD-10-CM

## 2010-10-23 DIAGNOSIS — R0602 Shortness of breath: Secondary | ICD-10-CM

## 2010-10-23 DIAGNOSIS — I4891 Unspecified atrial fibrillation: Secondary | ICD-10-CM

## 2010-10-23 LAB — HEPATIC FUNCTION PANEL
ALT: 42 U/L — ABNORMAL HIGH (ref 0–35)
AST: 41 U/L — ABNORMAL HIGH (ref 0–37)
Bilirubin, Direct: 0.1 mg/dL (ref 0.0–0.3)
Total Bilirubin: 0.3 mg/dL (ref 0.3–1.2)

## 2010-10-23 LAB — LIPID PANEL: VLDL: 26.4 mg/dL (ref 0.0–40.0)

## 2010-10-23 NOTE — Assessment & Plan Note (Signed)
Paroxysmal.  She is in sinus rhythm now.  No recent atrial fibrillation-type symptoms.  She is no longer taking dronedarone, and it does not seem like she really needs it.  She is on coumadin.  

## 2010-10-23 NOTE — Assessment & Plan Note (Signed)
Breathing better and decreased weight on Lasix. Continue current dose.

## 2010-10-23 NOTE — Assessment & Plan Note (Signed)
S/p right CEA, follows with [facility].  

## 2010-10-23 NOTE — Patient Instructions (Signed)
Your physician wants you to follow-up in: 6 months with Dr McLean. ( December 2012) You will receive a reminder letter in the mail two months in advance. If you don't receive a letter, please call our office to schedule the follow-up appointment.  

## 2010-10-23 NOTE — Assessment & Plan Note (Signed)
Nonobstructive on recent cath.  Continue ASA 81 mg daily and statin.  Goal LDL < 70.  She is a bit above this goal.  Encourage diet and exercise.  

## 2010-10-23 NOTE — Assessment & Plan Note (Signed)
Multifactorial but I suspect that the significant contributors are diastolic CHF, COPD, and nocturnal desaturation.  She is feeling much better now on Lasix, Qvar, and oxygen at night.

## 2010-10-23 NOTE — Assessment & Plan Note (Signed)
Borderline elevated BP today. Will continue current meds for now.

## 2010-10-23 NOTE — Progress Notes (Signed)
PCP: Dr. Jacky Kindle   77 yo with history of paroxysmal atrial fibrillation, nonobstructive CAD, and chronic exertional dyspnea presents for cardiology followup. Her main problem recently has been exertional dyspnea. She has had quite significant shortness of breath for about 2 years now. There was a definite change in her symptomatology at that time.  Patient has had an extensive workup for dyspnea. She had a right and left heart cath in 2/11 that showed nonobstructive CAD as well as normal pulmonary artery pressure and normal wedge pressure. Echo showed normal LV systolic function. She had a V/Q scan in 7/11 with no evidence for PE. She never smoked. She was seen by pulmonology and it was thought that reflux may be a cause of her symptoms. She was started on bid Protonix with some improvement but really minimal. She recently was in our office for ETT-myoview as a pre-operative test before surgery for endometrial cancer. There was no evidence for ischemia or infarction on perfusion images or ECG but the patient's oxygen saturation dropped from 96% at rest to 83% at peak exertion. She then had a high-resolution chest CT showing no significant interstitial lung disease but there was evidence for emphysema.  PFTs showed normal spirometry but there was decreased DLCO. She saw pulmonology again and it was thought that she could have a component of emphysema from passive smoking.  She is now on a Qvar inhaler which she thinks is helping. She also had overnight oximetry with significant desaturation and is on oxygen at night. I also started her on Lasix for a probable component of diastolic CHF.  With treatment of COPD with inhalers, treatment of diastolic CHF with Lasix, and treatment of nocturnal desaturation with oxygen, she feels considerably better.  She is doing more housework and walking around her house without problems.  No significant dyspnea walking on flat ground now.  No chest pain. Weight is down 5 lbs.    Labs (4/12): hgb 10.6, LDL 84, HDL 68, BNP 98  PMH:  1. Atrial fibrillation: Paroxysmal. She was on dronedarone briefly but it was stopped. She is on coumadin.  2. CHF: Diastolic. Echo (2/11) with mild LVH, EF 65-70%, PA systolic pressure 35 mmHg.  3. Carotid stenosis: right CEA in 2011.  4. Endometrial cancer s/p hysterectomy 4/12 5. HTN  6. Anemia  7. GERD/hiatal hernia  8. Hypothyroidism  9. TIA  10. CAD: LHC (2/11) with 40% LAD stenosis, 50% mCFX stenosis. ETT-myoview (3/12): 5:31 exercise, no ECG changes, oxygen saturation dropped from 96% at rest to 83% with exertion, no ischemia or infarction on perfusion images.  11. Dyspnea with exertion: Extensive workup in 2011. Echo and left heart cath as above. Right heart cath with mean PA pressure 23 mmHg and PCWP 11 mmHg. V/Q scan (7/11) low probability. PFTs (2/11) normal except for mildly decreased DLCO. ETT-myoview as above. O2 sats dropped with exercise. Patient was evaluated by pulmonology, thought to have GERD as cause of dyspnea.  CT chest without contrast (high resolution) in 4/12 showed emphysema but no interstitial lung disease.  PFTs (4/12) with normal spirometry but decreased DLCO.  Nocturnal oximetry showed desaturation and she is now on oxygen at night.  Dyspnea thought to be multifactorial with emphysema, deconditioning, and diastolic CHF thought to play a role.   SH: Married, never smoked, retired. Lives in [place]. Daughter is involved in her care.   FH: CAD   ROS: All systems reviewed and negative except as per HPI.   Current Outpatient Prescriptions  Medication Sig Dispense Refill  . aspirin 81 MG tablet Take 81 mg by mouth daily.        . bisoprolol (ZEBETA) 5 MG tablet Take 5 mg by mouth daily.        Marland Kitchen diltiazem (CARDIZEM CD) 240 MG 24 hr capsule Take 240 mg by mouth daily.        . furosemide (LASIX) 20 MG tablet Take 1 tablet (20 mg total) by mouth daily.  30 tablet  6  . iron polysaccharides (NIFEREX) 150 MG  capsule Take 150 mg by mouth 2 (two) times daily.        Marland Kitchen levothyroxine (SYNTHROID, LEVOTHROID) 50 MCG tablet Take 50 mcg by mouth daily.        . Melatonin 3-10 MG TABS Take 1 tablet by mouth every evening.        . nitroGLYCERIN (NITROSTAT) 0.4 MG SL tablet Place 0.4 mg under the tongue every 5 (five) minutes as needed.        . pantoprazole (PROTONIX) 40 MG tablet Take 40 mg by mouth 2 (two) times daily.       . potassium chloride SA (KLOR-CON M20) 20 MEQ tablet Take 1 tablet (20 mEq total) by mouth daily.  30 tablet  6  . QVAR 80 MCG/ACT inhaler INHALE 2 PUFFS INTO THE LUNGS 2 TIMES DAILY.  1 Inhaler  6  . simvastatin (ZOCOR) 40 MG tablet Take 1 tablet (40 mg total) by mouth every evening.  30 tablet  6  . warfarin (COUMADIN) 5 MG tablet Take 5 mg by mouth daily. As directed per Coumadin clinic       . DISCONTD: Mometasone Furo-Formoterol Fum (DULERA) 100-5 MCG/ACT AERO Inhale 1 puff into the lungs as needed.          BP 142/70  Pulse 65  Ht 5\' 1"  (1.549 m)  Wt 135 lb (61.236 kg)  BMI 25.51 kg/m2 General: NAD  Neck: JVP not elevated, no thyromegaly or thyroid nodule.  Lungs: Clear to auscultation bilaterally with normal respiratory effort.  CV: Nondisplaced PMI. Heart regular S1/S2, no S3/S4, no murmur. No peripheral edema. No carotid bruit. Normal pedal pulses.  Abdomen: Soft, nontender, no hepatosplenomegaly, no distention.  Neurologic: Alert and oriented x 3.  Psych: Normal affect.  Extremities: No clubbing or cyanosis.

## 2010-10-26 ENCOUNTER — Other Ambulatory Visit: Payer: Self-pay | Admitting: Cardiovascular Disease

## 2010-10-29 ENCOUNTER — Telehealth: Payer: Self-pay | Admitting: Internal Medicine

## 2010-10-29 DIAGNOSIS — R0600 Dyspnea, unspecified: Secondary | ICD-10-CM

## 2010-10-29 NOTE — Telephone Encounter (Signed)
Spoke with [employer]. She states that Dr. Noland Fordyce has okayed for pt to have bike test. Wants this done before her appt with MR for 11/23/10. Please send order thanks

## 2010-10-29 NOTE — Telephone Encounter (Signed)
Order placed an sent to [facility]cc. Carron Curie, CMA

## 2010-10-29 NOTE — Telephone Encounter (Signed)
LMTCBx1.Jennifer Castillo, CMA  

## 2010-10-29 NOTE — Telephone Encounter (Signed)
PATIENT RETURNED CALL.  PLEASE CALL BACK AT HOME

## 2010-10-29 NOTE — Telephone Encounter (Signed)
forwardin to [name] for CPST order

## 2010-11-16 IMAGING — CT CT ORBIT/TEMPORAL/IAC W/O CM
3 of 6 series · 15 of 47 positions shown, 18 images · non-contrast
Comparison: 06/24/2009.

CT HEAD

CLINICAL DATA: Trauma.  Fall.  Right periorbital injury. Neck
pain.  Anticoagulated patient.

CT HEAD WITHOUT CONTRAST
CT MAXILLOFACIAL WITHOUT CONTRAST
CT CERVICAL SPINE WITHOUT CONTRAST
TECHNIQUE: Multidetector CT imaging of the head, cervical spine,
and maxillofacial structures were performed using the standard
protocol without intravenous contrast. Multiplanar CT image
reconstructions of the cervical spine and maxillofacial structures
were also generated.

[Series 4: c_spine 2.0 b31s · axial · 0.23mm/px · z∈[-292,-172]mm · 9 of 74 slices shown, 12 images]
[im 7/74  brain]
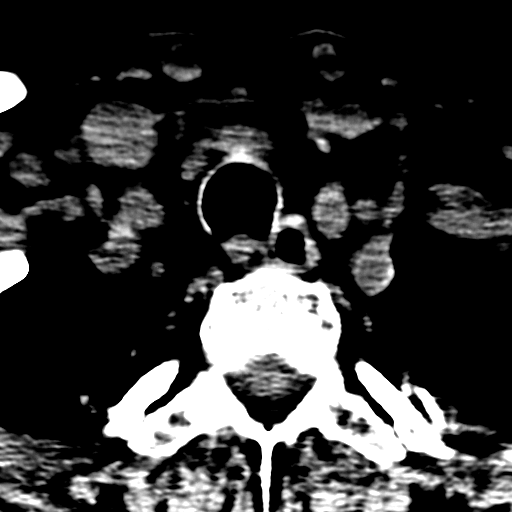
[im 7/74  bone]
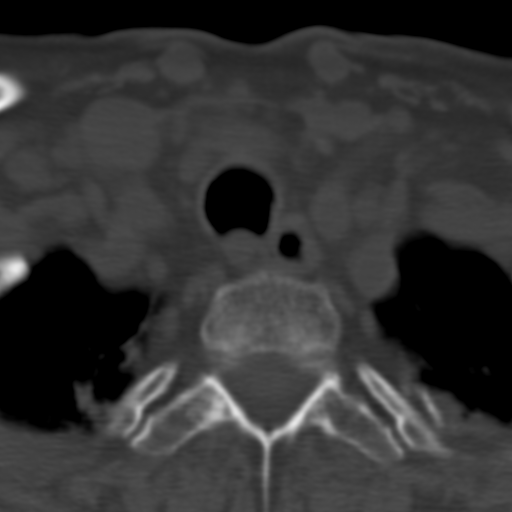
[im 14/74  bone]
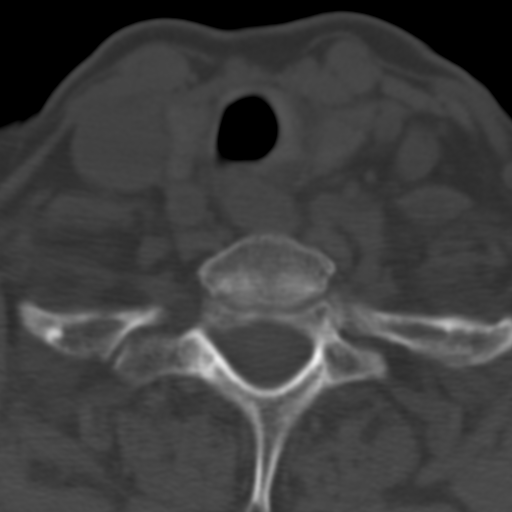
[im 20/74  bone]
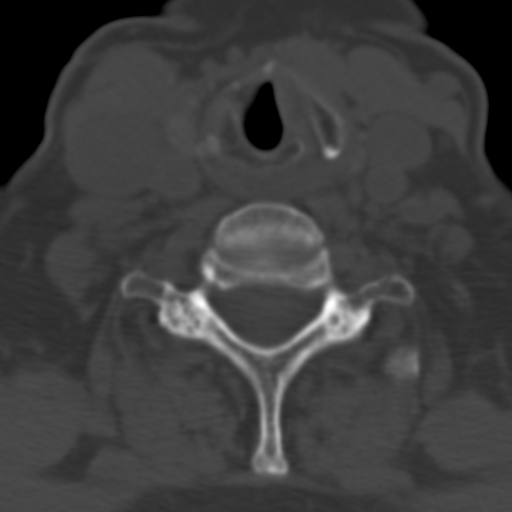
[im 27/74  bone]
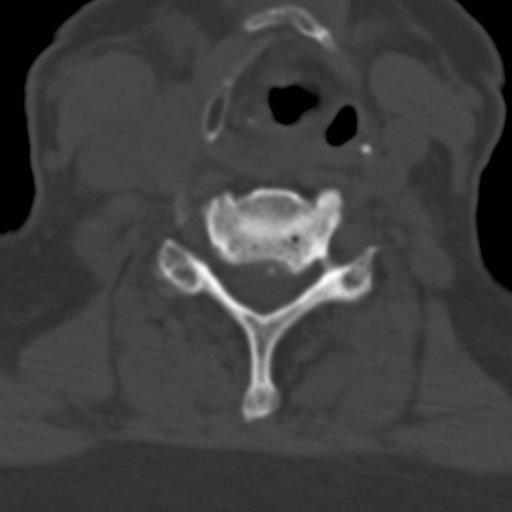
[im 40/74  brain]
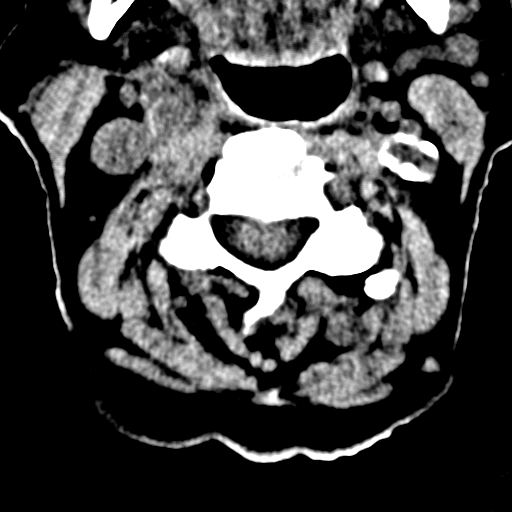
[im 40/74  bone]
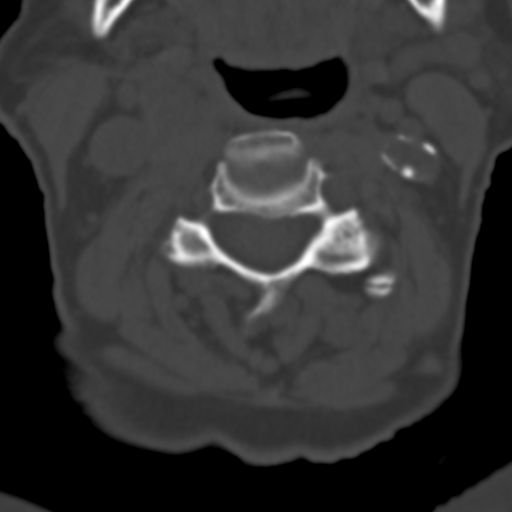
[im 47/74  bone]
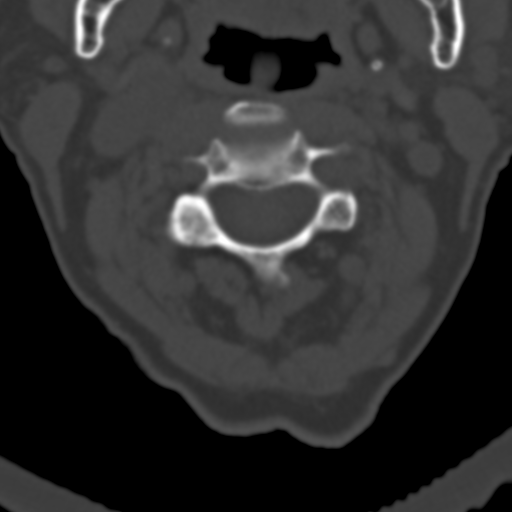
[im 54/74  bone]
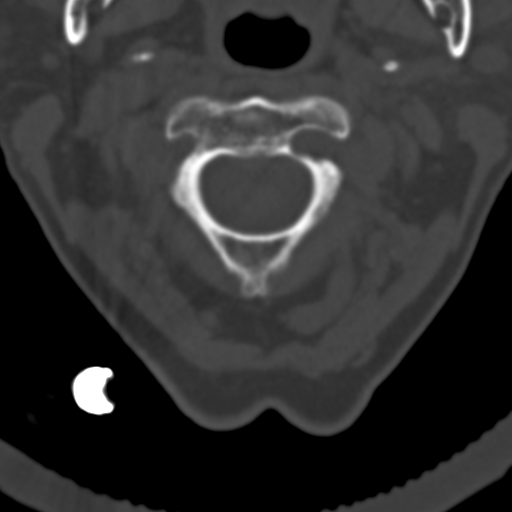
[im 60/74  bone]
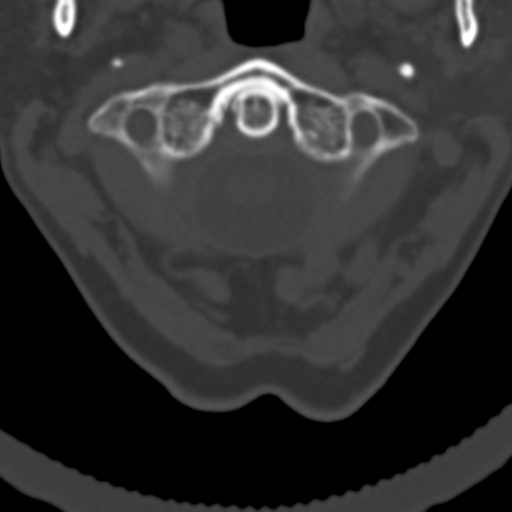
[im 67/74  brain]
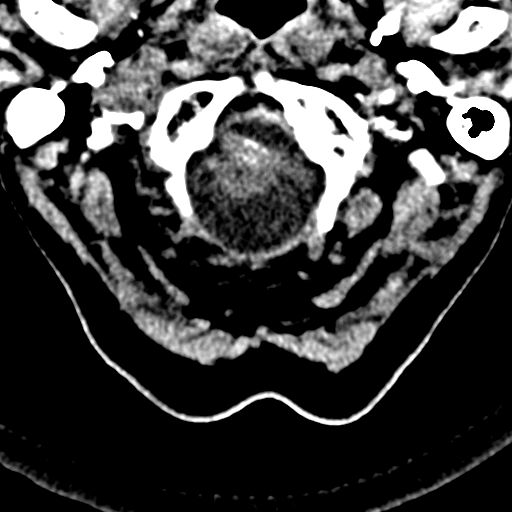
[im 67/74  bone]
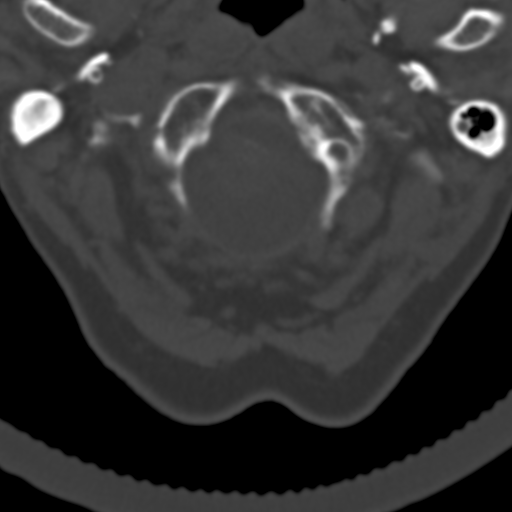

[Series 604: sagittal cervical · sagittal · 0.29mm/px · 3 of 32 slices shown]
[im 11/32  bone]
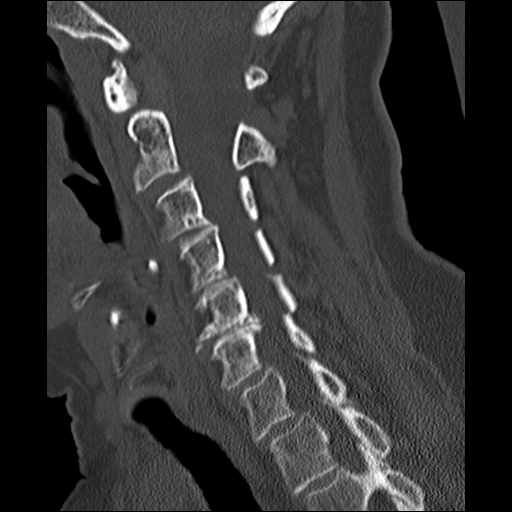
[im 16/32  bone]
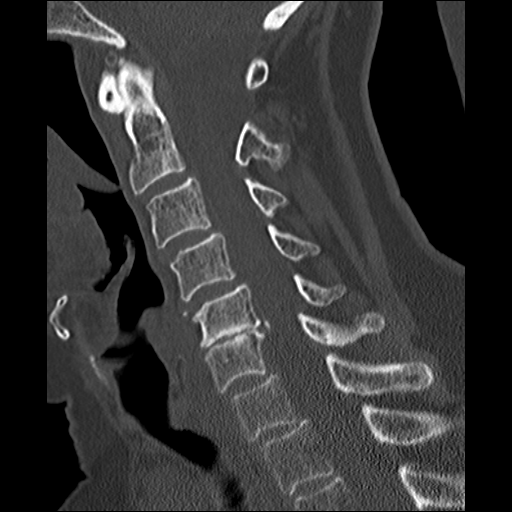
[im 21/32  bone]
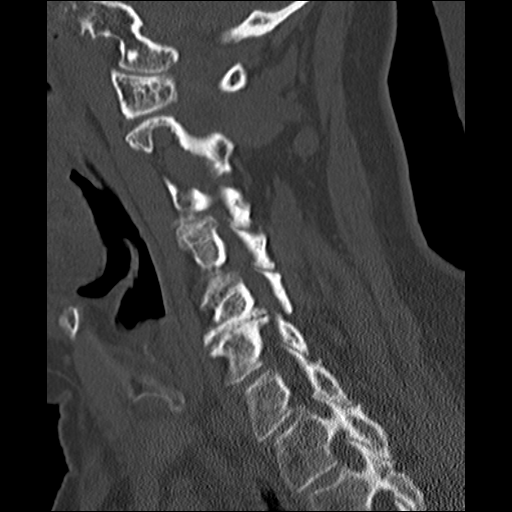

[Series 605: coronal orbits · coronal · 0.29mm/px · 3 of 58 slices shown]
[im 18/58  bone]
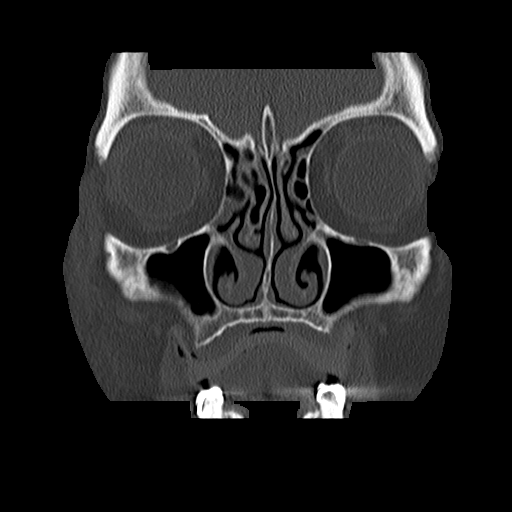
[im 26/58  bone]
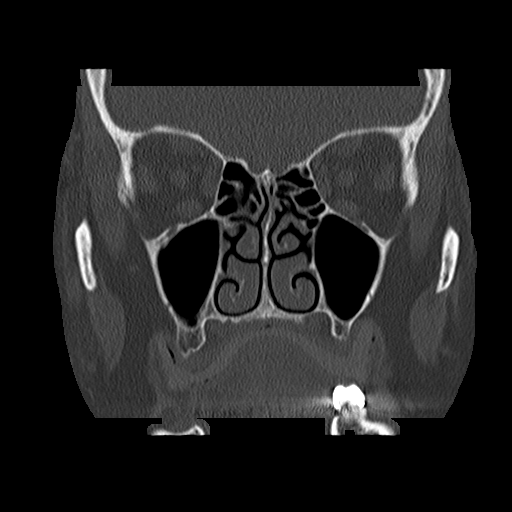
[im 35/58  bone]
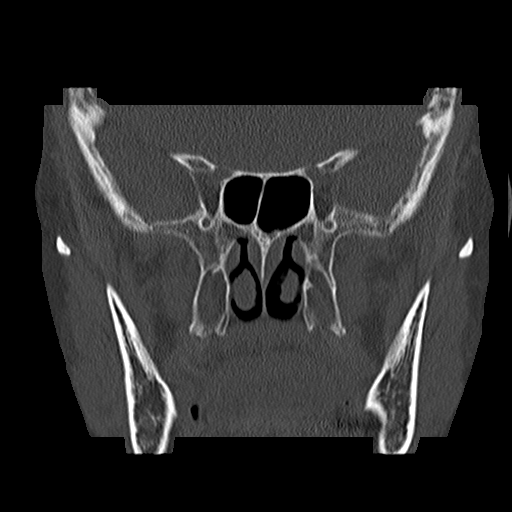

[15 of 47 positions shown; findings below may reference images not displayed]

FINDINGS: No mass lesion, mass effect, midline shift,
hydrocephalus, hemorrhage.  No acute territorial cortical
ischemia/infarct. Atrophy and chronic ischemic white matter disease
is present. Opacification of a few left mastoid air cells which may
be chronic.  Correlate for mastoiditis.
IMPRESSION: No acute intracranial abnormality.  Opacification of a few left
mastoid air cells.  Correlate for mastoiditis.

CT MAXILLOFACIAL
FINDINGS: There is a small focal subcutaneous hematoma measuring
about 1 cm in the right glabella region.  Periorbital soft tissue
swelling is present on the right extending into the right cheek.
There is a right cheek 2.6 cm subcutaneous hematoma.  The orbital
rim is intact.  Globes intact.  Pterygoid plates intact.  Maxillary
sinuses clear.  Scattered ethmoid mucosal thickening.  Hypoplastic
right frontal sinus.  Zygomatic arch intact.  Bilateral
temporomandibular joint osteoarthritis.  Nasal bones intact.
IMPRESSION: Subcutaneous hematoma and soft tissue swelling in the right
periorbital tissues.  No facial fracture identified.

CT CERVICAL SPINE
FINDINGS: 3 mm of anterolisthesis of C6 on C7 and 3 mm of
anterolisthesis of C7 on T1.  There is significant degenerative
disease of both of these levels, making the spondylolisthesis
likely degenerative rather than traumatic.  2 mm anterolisthesis of
C3 on C4, also associated with facet arthrosis.  Mild
anterolisthesis of C2 on C3.  Atlantodental degenerative disease.
Craniocervical alignment is normal.  Prevertebral soft tissues are
within normal limits.  There is a prominent disc osteophyte complex
at C5-C6 producing moderate bony central stenosis and left greater
than right foraminal stenosis.  Multilevel foraminal stenosis is
present at other levels.  Carotid atherosclerosis noted.  Bilateral
pulmonary parenchymal scarring. Postoperative changes of right
carotid endarterectomy.
IMPRESSION: No cervical spine fracture or dislocation.  Multilevel
spondylolisthesis appears degenerative, associated with disc and
facet disease.

## 2010-11-20 ENCOUNTER — Ambulatory Visit (HOSPITAL_COMMUNITY): Payer: Medicare Other | Attending: Internal Medicine

## 2010-11-20 DIAGNOSIS — Z09 Encounter for follow-up examination after completed treatment for conditions other than malignant neoplasm: Secondary | ICD-10-CM | POA: Insufficient documentation

## 2010-11-20 DIAGNOSIS — R0609 Other forms of dyspnea: Secondary | ICD-10-CM | POA: Insufficient documentation

## 2010-11-20 DIAGNOSIS — R0989 Other specified symptoms and signs involving the circulatory and respiratory systems: Secondary | ICD-10-CM | POA: Insufficient documentation

## 2010-11-22 ENCOUNTER — Encounter: Payer: Self-pay | Admitting: Gynecology

## 2010-11-23 ENCOUNTER — Ambulatory Visit (INDEPENDENT_AMBULATORY_CARE_PROVIDER_SITE_OTHER): Payer: Medicare Other | Admitting: Internal Medicine

## 2010-11-23 ENCOUNTER — Encounter: Payer: Self-pay | Admitting: Internal Medicine

## 2010-11-23 VITALS — BP 128/68 | HR 67 | Temp 98.0°F | Ht 62.0 in | Wt 139.4 lb

## 2010-11-23 DIAGNOSIS — J984 Other disorders of lung: Secondary | ICD-10-CM

## 2010-11-23 DIAGNOSIS — R0602 Shortness of breath: Secondary | ICD-10-CM

## 2010-11-23 DIAGNOSIS — R0600 Dyspnea, unspecified: Secondary | ICD-10-CM

## 2010-11-23 DIAGNOSIS — J438 Other emphysema: Secondary | ICD-10-CM

## 2010-11-23 NOTE — Assessment & Plan Note (Signed)
April 2012: Small nodule identified within the right lower lobe measures 4.3 mm, image 32. Due to pasisve smoking and patient age, underlying uterine malignancy and her concern will get repeat ct chest in 1 year in April 2013

## 2010-11-23 NOTE — Assessment & Plan Note (Signed)
CPST 11/20/2010 shows good effort but with normal VO1 max 102% predcited and normal AT 76% but O2 pulse is just flat after the first second with significant HR reserve that is c/w beta blockade. Suspect diastolic dysfunction. No evidence of pulmonary limitation  PLAN Refer back to cards - wil send note to Dr. Shirlee Latch Strongly encouraged pulmonary rehab (she wants to defer till she finishes XRT for uterine malignancy) ROV 4-6 months

## 2010-11-23 NOTE — Patient Instructions (Addendum)
#  Shortness of breath CPSt bike test 11/20/2010 suggests DIASTOLIC DYSFUNCTION of heart as cause of shortness of breath  - this is when the heart muscle has problem relaxing when you exert yourself Recommend you follow with Dr. Shirlee Latch for this Recommend also you go through pulmonary rehab exercise program Return to see me in 4-5 months to review how things are going Come sooner if there are problems #Pulmonary nodule  If you remember this was noted April 2012 You need repeat CT chest without contrast April 2013. My nurse will ensure there is an order

## 2010-11-23 NOTE — Progress Notes (Signed)
Subjective:    Patient ID: [name], female    DOB: 06/18/1923, 77 y.o.   MRN: [id number]  HPI    Followup dysponea. Since last visit, abdominal wound healed. She reports improved exercise tolerance and less dysponea. Now able to walk 2 blocks without dyspnea.  Dyspnea still relieved by rest. Also brought on by stooping over. Asociated improved ADLs at home plus. Reports sympotoms as mild-moderate. No associated cough, chest pain. Family noticed some associated edema (mild) in hands and feet past few days. Independent review CPST (done on QVAR for emphysema) 11/20/2010 suggests diasgtolic dysfunction - the O2 pulse is flat. She gave good effort and overall VO2 max and AT are normal without resp limitation or desaturation (? QVAR effect on emphysema). PMhx review shows hx psotiive for diastolic chf on cath feb 2011   Past Medical History  Diagnosis Date  . TIA (transient ischemic attack)   . Unspecified hypothyroidism   . Unspecified essential hypertension   . Allergic rhinitis, cause unspecified   . Arrhythmia   . Hiatal hernia   . SOB (shortness of breath)     unexplained SOB onset fall 2010. PFT normal 06-12-09. Right heatrt Cath 06-26-09 nl PA but wedge 23 with non obst CAD, echo same day: Left ventricle: the cavity size was normal. Wall thickness was increased in a pattern of mild LVH. Systolic function was vigorous. Estimated ejection fraction 65% to 70%. Left atrium mildly dilated. nml walking sats 11-17-2009     Family History  Problem Relation Age of Onset  . Heart disease Mother   . Heart disease Sister   . Cancer Sister   . COPD Sister   . Heart disease Brother   . Cancer Brother   . Coronary artery disease Other      History   Social History  . Marital Status: Married    Spouse Name: N/A    Number of Children: N/A  . Years of Education: N/A   Occupational History  . retired from daycare    Social History Main Topics  . Smoking status: Passive Smoker  .  Smokeless tobacco: Not on file  . Alcohol Use: No  . Drug Use: No  . Sexually Active: Not on file   Other Topics Concern  . Not on file   Social History Narrative  . No narrative on file     Allergies  Allergen Reactions  . Albuterol     devloped tremors, swelling during PFT following albuterol     Outpatient Prescriptions Prior to Visit  Medication Sig Dispense Refill  . aspirin 81 MG tablet Take 81 mg by mouth daily.        . bisoprolol (ZEBETA) 5 MG tablet Take 5 mg by mouth daily.        Marland Kitchen diltiazem (CARDIZEM CD) 240 MG 24 hr capsule Take 240 mg by mouth daily.        . furosemide (LASIX) 20 MG tablet Take 1 tablet (20 mg total) by mouth daily.  30 tablet  6  . iron polysaccharides (NIFEREX) 150 MG capsule Take 150 mg by mouth 2 (two) times daily.        Marland Kitchen levothyroxine (SYNTHROID, LEVOTHROID) 50 MCG tablet Take 50 mcg by mouth daily.        . Melatonin 3-10 MG TABS Take 1 tablet by mouth every evening.        . nitroGLYCERIN (NITROSTAT) 0.4 MG SL tablet Place 0.4 mg under the tongue every 5 (  five) minutes as needed.        . pantoprazole (PROTONIX) 40 MG tablet Take 40 mg by mouth 2 (two) times daily.       . potassium chloride SA (KLOR-CON M20) 20 MEQ tablet Take 1 tablet (20 mEq total) by mouth daily.  30 tablet  6  . QVAR 80 MCG/ACT inhaler INHALE 2 PUFFS INTO THE LUNGS 2 TIMES DAILY.  1 Inhaler  6  . simvastatin (ZOCOR) 20 MG tablet TAKE 1 TABLET BY MOUTH EVERY DAY  90 tablet  2  . simvastatin (ZOCOR) 40 MG tablet Take 1 tablet (40 mg total) by mouth every evening.  30 tablet  6  . warfarin (COUMADIN) 5 MG tablet Take 5 mg by mouth daily. As directed per Coumadin clinic          Objective:   Physical Exam   Review of Systems  Constitutional: Negative for fever and unexpected weight change.  HENT: Negative for ear pain, nosebleeds, congestion, sore throat, rhinorrhea, sneezing, trouble swallowing, dental problem, postnasal drip and sinus pressure.   Eyes: Negative  for redness and itching.  Respiratory: Positive for shortness of breath. Negative for cough, chest tightness and wheezing.   Cardiovascular: Negative for palpitations and leg swelling.  Gastrointestinal: Negative for nausea and vomiting.  Genitourinary: Negative for dysuria.  Musculoskeletal: Negative for joint swelling.  Skin: Negative for rash.  Neurological: Negative for headaches.  Hematological: Does not bruise/bleed easily.  Psychiatric/Behavioral: Negative for dysphoric mood. The patient is not nervous/anxious.        Objective:   Physical Exam   Physical Exam  Vitals reviewed. Constitutional: She is oriented to person, place, and time. She appears well-developed and well-nourished. No distress.       overwegiht  HENT:  Head: Normocephalic and atraumatic.  Right Ear: External ear normal.  Left Ear: External ear normal.  Mouth/Throat: Oropharynx is clear and moist. No oropharyngeal exudate.  Eyes: Conjunctivae and EOM are normal. Pupils are equal, round, and reactive to light. Right eye exhibits no discharge. Left eye exhibits no discharge. No scleral icterus.  Neck: Normal range of motion. Neck supple. No JVD present. No tracheal deviation present. No thyromegaly present.  Cardiovascular: Normal rate, regular rhythm, normal heart sounds and intact distal pulses.  Exam reveals no gallop and no friction rub.   No murmur heard. Pulmonary/Chest: Effort normal and breath sounds normal. No respiratory distress. She has no wheezes. She has no rales. She exhibits no tenderness.  Abdominal: Soft. Bowel sounds are normal. She exhibits no distension and no mass. There is no tenderness. There is no rebound and no guarding. HOWEVER, HAS OPEN WOUND IN LOWER PART HEALING BY SECONDARY INTENTION Musculoskeletal: Normal range of motion. She exhibits no edema and no tenderness.  Lymphadenopathy:    She has no cervical adenopathy.  Neurological: She is alert and oriented to person, place, and  time. She has normal reflexes. No cranial nerve deficit. She exhibits normal muscle tone. Coordination normal.       Flat affect is IMPROVED Skin: Skin is warm and dry. No rash noted. She is not diaphoretic. No erythema. No pallor.  Psychiatric: She has a normal mood and affect. Her behavior is normal. Judgment and thought content normal.         Assessment & Plan:   No problem-specific assessment & plan notes found for this encounter.

## 2010-11-23 NOTE — Assessment & Plan Note (Signed)
Passive smoker. Isolated Low dlco on PFTs feb 2011 and April 2012. Visualzied on CT chest April 2012. Had exertional desat and started home o2 at night spring 2012 STarted QVAR Spring 2012. CPST July 2012 did not show resp limitations   PLAN Continue qvar Continue nocturnal o2 Encouraged o2 Will reassess if dyspnea better over time after diastolic chf optimization. Depending on that oculd potentially trial off qvar and reassess

## 2010-11-28 ENCOUNTER — Encounter: Payer: Self-pay | Admitting: Cardiology

## 2010-11-28 ENCOUNTER — Ambulatory Visit (INDEPENDENT_AMBULATORY_CARE_PROVIDER_SITE_OTHER): Payer: Medicare Other | Admitting: Cardiology

## 2010-11-28 DIAGNOSIS — R0989 Other specified symptoms and signs involving the circulatory and respiratory systems: Secondary | ICD-10-CM

## 2010-11-28 DIAGNOSIS — I2510 Atherosclerotic heart disease of native coronary artery without angina pectoris: Secondary | ICD-10-CM

## 2010-11-28 DIAGNOSIS — R0602 Shortness of breath: Secondary | ICD-10-CM

## 2010-11-28 DIAGNOSIS — Z136 Encounter for screening for cardiovascular disorders: Secondary | ICD-10-CM

## 2010-11-28 DIAGNOSIS — I10 Essential (primary) hypertension: Secondary | ICD-10-CM

## 2010-11-28 DIAGNOSIS — I4891 Unspecified atrial fibrillation: Secondary | ICD-10-CM

## 2010-11-28 DIAGNOSIS — R0600 Dyspnea, unspecified: Secondary | ICD-10-CM

## 2010-11-28 LAB — BASIC METABOLIC PANEL
BUN: 18 mg/dL (ref 6–23)
CO2: 30 mEq/L (ref 19–32)
GFR: 44.96 mL/min — ABNORMAL LOW (ref >60.00)
Glucose, Bld: 100 mg/dL — ABNORMAL HIGH (ref 70–99)
Potassium: 4.4 mEq/L (ref 3.5–5.1)

## 2010-11-28 NOTE — Patient Instructions (Signed)
Please have blood work drawn today  The current medical regimen is effective;  continue present plan and medications.  Follow up in 6 months with Dr Shirlee Latch.  You will receive a letter in the mail 2 months before you are due.  Please call us when you receive this letter to schedule your follow up appointment.

## 2010-11-29 NOTE — Assessment & Plan Note (Signed)
Paroxysmal.  She is in sinus rhythm now.  No recent atrial fibrillation-type symptoms.  She is no longer taking dronedarone, and it does not seem like she really needs it.  She is on coumadin.

## 2010-11-29 NOTE — Assessment & Plan Note (Signed)
Multifactorial but I suspect that the significant contributors are diastolic CHF, COPD, and nocturnal desaturation.  She has been doing better on Lasix, Qvar, and oxygen at night.  Recent cardiopulmonary exercise test suggested that diastolic dysfunction is present.  The treatment for this will be blood pressure control and lower filling pressures with Lasix.  She does not appear volume overloaded on exam today.  I will check a BNP today.  If this is elevated, would consider increasing Lasix dose.

## 2010-11-29 NOTE — Assessment & Plan Note (Signed)
S/p right CEA, follows with [facility].

## 2010-11-29 NOTE — Assessment & Plan Note (Signed)
Blood pressure is reasonably controlled. 

## 2010-11-29 NOTE — Progress Notes (Signed)
PCP: Dr. Jacky Kindle   77 yo with history of paroxysmal atrial fibrillation, nonobstructive CAD, and chronic exertional dyspnea presents for cardiology followup. Her main problem recently has been exertional dyspnea. She has had quite significant shortness of breath for over 2 years now. There was a definite change in her symptomatology at that time.  Patient has had an extensive workup for dyspnea. She had a right and left heart cath in 2/11 that showed nonobstructive CAD as well as normal pulmonary artery pressure and normal wedge pressure. Echo showed normal LV systolic function. She had a V/Q scan in 7/11 with no evidence for PE. She never smoked. She was seen by pulmonology and it was thought that reflux may be a cause of her symptoms. She was started on bid Protonix with some improvement but really minimal. She was in our office in 3/12 for ETT-myoview as a pre-operative test before surgery for endometrial cancer. There was no evidence for ischemia or infarction on perfusion images or ECG but the patient's oxygen saturation dropped from 96% at rest to 83% at peak exertion. She then had a high-resolution chest CT showing no significant interstitial lung disease but there was evidence for emphysema.  PFTs showed normal spirometry but there was decreased DLCO. She saw pulmonology again and it was thought that she could have a component of emphysema from passive smoking.  She is now on a Qvar inhaler which she thinks is helping. She also had overnight oximetry with significant desaturation and is on oxygen at night. I also started her on Lasix for a probable component of diastolic CHF.  With treatment of COPD with inhalers, treatment of diastolic CHF with Lasix, and treatment of nocturnal desaturation with oxygen, she has felt considerably better.  She is doing more housework and walking around her house without problems.  She is walking up to 2 blocks now before stopping.  No chest pain. Weight is stable.  She is  going to be getting radiation for her uterine cancer.  She had a cardiopulmonary exercise test done recently suggesting diastolic dysfunction.   Labs (4/12): hgb 10.6, LDL 84, HDL 68, BNP 98  PMH:  1. Atrial fibrillation: Paroxysmal. She was on dronedarone briefly but it was stopped. She is on coumadin.  2. CHF: Diastolic. Echo (2/11) with mild LVH, EF 65-70%, PA systolic pressure 35 mmHg.  3. Carotid stenosis: right CEA in 2011.  4. Endometrial cancer s/p hysterectomy 4/12 5. HTN  6. Anemia  7. GERD/hiatal hernia  8. Hypothyroidism  9. TIA  10. CAD: LHC (2/11) with 40% LAD stenosis, 50% mCFX stenosis. ETT-myoview (3/12): 5:31 exercise, no ECG changes, oxygen saturation dropped from 96% at rest to 83% with exertion, no ischemia or infarction on perfusion images.  11. Dyspnea with exertion: Extensive workup in 2011. Echo and left heart cath as above. Right heart cath with mean PA pressure 23 mmHg and PCWP 11 mmHg. V/Q scan (7/11) low probability. PFTs (2/11) normal except for mildly decreased DLCO. ETT-myoview as above. O2 sats dropped with exercise. Patient was evaluated by pulmonology, thought to have GERD as cause of dyspnea.  CT chest without contrast (high resolution) in 4/12 showed emphysema but no interstitial lung disease.  PFTs (4/12) with normal spirometry but decreased DLCO.  Nocturnal oximetry showed desaturation and she is now on oxygen at night.  Cardiopulmonary exercise test suggested a component of diastolic dysfunction.  Dyspnea thought to be multifactorial with emphysema, deconditioning, and diastolic CHF thought to play a role.  SH: Married, never smoked, retired. Lives in [place]. Daughter is involved in her care.   FH: CAD   ROS: All systems reviewed and negative except as per HPI.   Current Outpatient Prescriptions  Medication Sig Dispense Refill  . aspirin 81 MG tablet Take 81 mg by mouth daily.        . bisoprolol (ZEBETA) 5 MG tablet Take 5 mg by mouth daily.         . Calcium Carbonate (CALTRATE 600 PO) Take by mouth daily.        Marland Kitchen diltiazem (CARDIZEM CD) 240 MG 24 hr capsule Take 240 mg by mouth daily.        . furosemide (LASIX) 20 MG tablet Take 1 tablet (20 mg total) by mouth daily.  30 tablet  6  . iron polysaccharides (NIFEREX) 150 MG capsule Take 150 mg by mouth 2 (two) times daily.        Marland Kitchen levothyroxine (SYNTHROID, LEVOTHROID) 50 MCG tablet Take 50 mcg by mouth daily.        . Melatonin 3-10 MG TABS Take 1 tablet by mouth every evening.        . nitroGLYCERIN (NITROSTAT) 0.4 MG SL tablet Place 0.4 mg under the tongue every 5 (five) minutes as needed.        . potassium chloride SA (KLOR-CON M20) 20 MEQ tablet Take 1 tablet (20 mEq total) by mouth daily.  30 tablet  6  . QVAR 80 MCG/ACT inhaler INHALE 2 PUFFS INTO THE LUNGS 2 TIMES DAILY.  1 Inhaler  6  . simvastatin (ZOCOR) 40 MG tablet Take 1 tablet (40 mg total) by mouth every evening.  30 tablet  6  . warfarin (COUMADIN) 5 MG tablet Take 5 mg by mouth daily. As directed per Coumadin clinic         Ht 5\' 2"  (1.575 m)  Wt 135 lb (61.236 kg)  BMI 24.69 kg/m2 BP 135/65  HR 60 General: NAD  Neck: JVP not elevated, no thyromegaly or thyroid nodule.  Lungs: Clear to auscultation bilaterally with normal respiratory effort.  CV: Nondisplaced PMI. Heart regular S1/S2, no S3/S4, no murmur. No peripheral edema. No carotid bruit. Normal pedal pulses.  Abdomen: Soft, nontender, no hepatosplenomegaly, no distention.  Neurologic: Alert and oriented x 3.  Psych: Normal affect.  Extremities: No clubbing or cyanosis.

## 2010-11-29 NOTE — Assessment & Plan Note (Signed)
Nonobstructive on recent cath.  Continue ASA 81 mg daily and statin.  Goal LDL < 70.

## 2010-11-30 ENCOUNTER — Telehealth: Payer: Self-pay | Admitting: *Deleted

## 2010-11-30 MED ORDER — FUROSEMIDE 40 MG PO TABS: ORAL_TABLET | ORAL | Status: DC

## 2010-11-30 MED ORDER — POTASSIUM CHLORIDE CRYS ER 20 MEQ PO TBCR: EXTENDED_RELEASE_TABLET | ORAL | Status: DC

## 2010-11-30 NOTE — Telephone Encounter (Signed)
Notes Recorded by Jacqlyn Krauss, RN on 11/30/2010 at 1:15 PM I discussed Dr Alford Highland recommendations with pt's daughter, Alinda Money. She agreed and verbalized understanding. ------  Notes Recorded by Marca Ancona, MD on 11/28/2010 at 5:03 PM Labs ok except BNP is a little higher. She can try going up to Lasix 40 mg daily alternating with 20 mg daily and KCl 40 mEq daily alternating with 20 mEq daily (had been on Lasix/KCl 20/20).

## 2010-12-03 ENCOUNTER — Ambulatory Visit
Admission: RE | Admit: 2010-12-03 | Discharge: 2010-12-03 | Disposition: A | Discharge status: Home / Self Care | Payer: Medicare Other | Source: Ambulatory Visit | Attending: Radiation Oncology | Admitting: Radiation Oncology

## 2010-12-03 DIAGNOSIS — Z9079 Acquired absence of other genital organ(s): Secondary | ICD-10-CM | POA: Insufficient documentation

## 2010-12-03 DIAGNOSIS — C549 Malignant neoplasm of corpus uteri, unspecified: Secondary | ICD-10-CM | POA: Insufficient documentation

## 2010-12-03 DIAGNOSIS — C7982 Secondary malignant neoplasm of genital organs: Secondary | ICD-10-CM | POA: Insufficient documentation

## 2010-12-03 DIAGNOSIS — Z803 Family history of malignant neoplasm of breast: Secondary | ICD-10-CM | POA: Insufficient documentation

## 2010-12-03 DIAGNOSIS — E7800 Pure hypercholesterolemia, unspecified: Secondary | ICD-10-CM | POA: Insufficient documentation

## 2010-12-03 DIAGNOSIS — I4891 Unspecified atrial fibrillation: Secondary | ICD-10-CM | POA: Insufficient documentation

## 2010-12-03 DIAGNOSIS — Z90710 Acquired absence of both cervix and uterus: Secondary | ICD-10-CM | POA: Insufficient documentation

## 2010-12-03 DIAGNOSIS — Z79899 Other long term (current) drug therapy: Secondary | ICD-10-CM | POA: Insufficient documentation

## 2010-12-03 DIAGNOSIS — Z8042 Family history of malignant neoplasm of prostate: Secondary | ICD-10-CM | POA: Insufficient documentation

## 2010-12-03 DIAGNOSIS — Z7982 Long term (current) use of aspirin: Secondary | ICD-10-CM | POA: Insufficient documentation

## 2010-12-03 DIAGNOSIS — Z7901 Long term (current) use of anticoagulants: Secondary | ICD-10-CM | POA: Insufficient documentation

## 2010-12-03 DIAGNOSIS — Z510 Encounter for antineoplastic radiation therapy: Secondary | ICD-10-CM | POA: Insufficient documentation

## 2010-12-04 ENCOUNTER — Telehealth: Payer: Self-pay | Admitting: Internal Medicine

## 2010-12-04 DIAGNOSIS — R0602 Shortness of breath: Secondary | ICD-10-CM

## 2010-12-04 NOTE — Telephone Encounter (Signed)
Candise Bowens,   ONO 11/08/10 completely normal. Please dc o2. Could not find what order to put that into  MR

## 2010-12-04 NOTE — Telephone Encounter (Signed)
[email or web address].Lynx Goodrich, CMA  

## 2010-12-05 ENCOUNTER — Encounter: Payer: Self-pay | Admitting: Internal Medicine

## 2010-12-07 ENCOUNTER — Encounter: Payer: Self-pay | Admitting: Internal Medicine

## 2011-01-23 ENCOUNTER — Other Ambulatory Visit: Payer: Self-pay | Admitting: Cardiovascular Disease

## 2011-01-23 LAB — URINALYSIS, ROUTINE W REFLEX MICROSCOPIC
Bilirubin Urine: NEGATIVE
Glucose, UA: NEGATIVE
Hgb urine dipstick: NEGATIVE
Ketones, ur: NEGATIVE
Protein, ur: NEGATIVE
Urobilinogen, UA: 0.2

## 2011-01-23 LAB — URINE CULTURE: Colony Count: 30000

## 2011-01-23 LAB — BASIC METABOLIC PANEL
Calcium: 9.3
GFR calc Af Amer: 43 — ABNORMAL LOW
GFR calc non Af Amer: 36 — ABNORMAL LOW
Sodium: 142

## 2011-01-23 LAB — CBC
Hemoglobin: 10.5 — ABNORMAL LOW
RBC: 3.63 — ABNORMAL LOW
RDW: 13.6
WBC: 4.5

## 2011-01-23 LAB — DIFFERENTIAL
Basophils Absolute: 0
Basophils Relative: 1
Eosinophils Absolute: 0.3
Eosinophils Relative: 7 — ABNORMAL HIGH
Monocytes Absolute: 0.7
Monocytes Relative: 15 — ABNORMAL HIGH
Neutro Abs: 2.2

## 2011-01-23 LAB — POCT CARDIAC MARKERS
Operator id: 1192
Troponin i, poc: <0.05

## 2011-01-23 LAB — URINE MICROSCOPIC-ADD ON

## 2011-02-18 ENCOUNTER — Ambulatory Visit
Admission: RE | Admit: 2011-02-18 | Discharge: 2011-02-18 | Disposition: A | Discharge status: Home / Self Care | Payer: Medicare Other | Source: Ambulatory Visit | Attending: Radiation Oncology | Admitting: Radiation Oncology

## 2011-02-18 DIAGNOSIS — C549 Malignant neoplasm of corpus uteri, unspecified: Secondary | ICD-10-CM | POA: Insufficient documentation

## 2011-02-18 DIAGNOSIS — Z923 Personal history of irradiation: Secondary | ICD-10-CM | POA: Insufficient documentation

## 2011-02-18 DIAGNOSIS — C7982 Secondary malignant neoplasm of genital organs: Secondary | ICD-10-CM | POA: Insufficient documentation

## 2011-02-18 LAB — URINALYSIS, MICROSCOPIC - CHCC
Ketones: NEGATIVE mg/dL
Protein: 100 mg/dL
Specific Gravity, Urine: 1.02 (ref 1.003–1.035)

## 2011-02-20 LAB — URINE CULTURE

## 2011-03-24 ENCOUNTER — Other Ambulatory Visit: Payer: Self-pay | Admitting: Cardiology

## 2011-03-26 ENCOUNTER — Ambulatory Visit: Payer: Medicare Other | Admitting: Internal Medicine

## 2011-03-26 ENCOUNTER — Telehealth: Payer: Self-pay | Admitting: Cardiology

## 2011-03-26 MED ORDER — POTASSIUM CHLORIDE CRYS ER 20 MEQ PO TBCR: EXTENDED_RELEASE_TABLET | ORAL | Status: DC

## 2011-03-26 NOTE — Telephone Encounter (Signed)
New message:  pts pres for Klor-Con was called in, but it was for the old dose.  She is now taking 2 one day and 1 the next, alternating.  Please call this back in with new dosage to [facility] so insurance will pay.  Pt is running low.  Phone number to [facility] [phone].

## 2011-04-05 ENCOUNTER — Encounter: Payer: Self-pay | Admitting: Internal Medicine

## 2011-04-05 ENCOUNTER — Ambulatory Visit (INDEPENDENT_AMBULATORY_CARE_PROVIDER_SITE_OTHER): Payer: Medicare Other | Admitting: Internal Medicine

## 2011-04-05 VITALS — BP 142/60 | HR 65 | Temp 97.1°F | Ht 62.0 in | Wt 141.8 lb

## 2011-04-05 DIAGNOSIS — R0602 Shortness of breath: Secondary | ICD-10-CM

## 2011-04-05 DIAGNOSIS — J984 Other disorders of lung: Secondary | ICD-10-CM

## 2011-04-05 NOTE — Progress Notes (Signed)
Subjective:    Patient ID: [name], female    DOB: 23-Oct-1923, 77 y.o.   MRN: [id number]  HPI OV 11/23/10: Followup dysponea. Since last visit, abdominal wound healed. She reports improved exercise tolerance and less dysponea. Now able to walk 2 blocks without dyspnea.  Dyspnea still relieved by rest. Also brought on by stooping over. Asociated improved ADLs at home plus. Reports sympotoms as mild-moderate. No associated cough, chest pain. Family noticed some associated edema (mild) in hands and feet past few days. Independent review CPST (done on QVAR for emphysema) 11/20/2010 suggests diasgtolic dysfunction - the O2 pulse is flat. She gave good effort and overall VO2 max and AT are normal without resp limitation or desaturation (? QVAR effect on emphysema). PMhx review shows hx psotiive for diastolic chf on cath feb 2011  #Shortness of breath  CPSt bike test 11/20/2010 suggests DIASTOLIC DYSFUNCTION of heart as cause of shortness of breath  - this is when the heart muscle has problem relaxing when you exert yourself  Recommend you follow with Dr. Shirlee Latch for this  Recommend also you go through pulmonary rehab exercise program  Return to see me in 4-5 months to review how things are going  Come sooner if there are problems   #Pulmonary nodule  If you remember this was noted April 2012  You need repeat CT chest without contrast April 2013. My nurse will ensure there is an order  OV 04/05/2011 Followup dyspnea. Since last visit overall stable. Dyspnea somewhat improved. She is finishingup her GYN RX. Still some dyspnea with household activities. She is satisfied with her current quality of life but dtr wants her better and able to do more. Patient not so motivated. Dtr wondering about giving her nocturnal o2. Patient not sure about wanting to attend rehab; dtr quite keen she attend.   Past Medical History  Diagnosis Date  . TIA (transient ischemic attack)   . Unspecified hypothyroidism     . Unspecified essential hypertension   . Allergic rhinitis, cause unspecified   . Arrhythmia   . Hiatal hernia   . SOB (shortness of breath)     unexplained SOB onset fall 2010. PFT normal 06-12-09. Right heatrt Cath 06-26-09 nl PA but wedge 23 with non obst CAD, echo same day: Left ventricle: the cavity size was normal. Wall thickness was increased in a pattern of mild LVH. Systolic function was vigorous. Estimated ejection fraction 65% to 70%. Left atrium mildly dilated. nml walking sats 11-17-2009     Family History  Problem Relation Age of Onset  . Heart disease Mother   . Heart disease Sister   . Cancer Sister   . COPD Sister   . Heart disease Brother   . Cancer Brother   . Coronary artery disease Other      History   Social History  . Marital Status: Married    Spouse Name: N/A    Number of Children: N/A  . Years of Education: N/A   Occupational History  . retired from daycare    Social History Main Topics  . Smoking status: Passive Smoker  . Smokeless tobacco: Not on file  . Alcohol Use: No  . Drug Use: No  . Sexually Active: Not on file   Other Topics Concern  . Not on file   Social History Narrative  . No narrative on file     Allergies  Allergen Reactions  . Albuterol     devloped tremors, swelling during PFT  following albuterol     Outpatient Prescriptions Prior to Visit  Medication Sig Dispense Refill  . aspirin 81 MG tablet Take 81 mg by mouth daily.        . bisoprolol (ZEBETA) 5 MG tablet Take 5 mg by mouth daily.        . Calcium Carbonate (CALTRATE 600 PO) Take by mouth daily.        Marland Kitchen diltiazem (CARDIZEM CD) 240 MG 24 hr capsule TAKE ONE CAPSULE BY MOUTH EVERY DAY  90 capsule  3  . furosemide (LASIX) 40 MG tablet Take 1  alternating with 1/2 daily  25 tablet  6  . iron polysaccharides (NIFEREX) 150 MG capsule Take 150 mg by mouth 2 (two) times daily.        Marland Kitchen levothyroxine (SYNTHROID, LEVOTHROID) 50 MCG tablet Take 50 mcg by mouth daily.         . Melatonin 3-10 MG TABS Take 1 tablet by mouth every evening.        . nitroGLYCERIN (NITROSTAT) 0.4 MG SL tablet Place 0.4 mg under the tongue every 5 (five) minutes as needed.        . potassium chloride SA (K-DUR,KLOR-CON) 20 MEQ tablet Take 2 tablets one day and 1 tablet the next day, alternating days  50 tablet  6  . QVAR 80 MCG/ACT inhaler INHALE 2 PUFFS INTO THE LUNGS 2 TIMES DAILY.  1 Inhaler  6  . simvastatin (ZOCOR) 40 MG tablet Take 1 tablet (40 mg total) by mouth every evening.  30 tablet  6  . warfarin (COUMADIN) 5 MG tablet Take 5 mg by mouth daily. As directed per Coumadin clinic              Review of Systems  Constitutional: Negative for fever and unexpected weight change.  HENT: Negative for ear pain, nosebleeds, congestion, sore throat, rhinorrhea, sneezing, trouble swallowing, dental problem, postnasal drip and sinus pressure.   Eyes: Negative for redness and itching.  Respiratory: Positive for shortness of breath. Negative for cough, chest tightness and wheezing.   Cardiovascular: Negative for palpitations and leg swelling.  Gastrointestinal: Negative for nausea and vomiting.  Genitourinary: Negative for dysuria.  Musculoskeletal: Negative for joint swelling.  Skin: Negative for rash.  Neurological: Negative for headaches.  Hematological: Does not bruise/bleed easily.  Psychiatric/Behavioral: Negative for dysphoric mood. The patient is not nervous/anxious.        Objective:   Physical Exam Vitals reviewed. Constitutional: She is oriented to person, place, and time. She appears well-developed and well-nourished. No distress.       overwegiht  HENT:  Head: Normocephalic and atraumatic.  Right Ear: External ear normal.  Left Ear: External ear normal.  Mouth/Throat: Oropharynx is clear and moist. No oropharyngeal exudate.  Eyes: Conjunctivae and EOM are normal. Pupils are equal, round, and reactive to light. Right eye exhibits no discharge. Left eye  exhibits no discharge. No scleral icterus.  Neck: Normal range of motion. Neck supple. No JVD present. No tracheal deviation present. No thyromegaly present.  Cardiovascular: Normal rate, regular rhythm, normal heart sounds and intact distal pulses.  Exam reveals no gallop and no friction rub.   No murmur heard. Pulmonary/Chest: Effort normal and breath sounds normal. No respiratory distress. She has no wheezes. She has no rales. She exhibits no tenderness.  Abdominal: Soft. Bowel sounds are normal. She exhibits no distension and no mass. There is no tenderness. There is no rebound and no guarding. HOWEVER, HAS OPEN WOUND  IN LOWER PART HEALING BY SECONDARY INTENTION Musculoskeletal: Normal range of motion. She exhibits no edema and no tenderness.  Lymphadenopathy:    She has no cervical adenopathy.  Neurological: She is alert and oriented to person, place, and time. She has normal reflexes. No cranial nerve deficit. She exhibits normal muscle tone. Coordination normal.       Flat affect is IMPROVED Skin: Skin is warm and dry. No rash noted. She is not diaphoretic. No erythema. No pallor.  Psychiatric: She has a normal mood and affect. Her behavior is normal. Judgment and thought content normal.         Assessment & Plan:

## 2011-04-05 NOTE — Assessment & Plan Note (Signed)
April 2012: Small nodule identified within the right lower lobe measures 4.3 mm, image 32. Due to pasisve smoking and patient age, underlying uterine malignancy and her concern will get repeat ct chest in 1 year in April 2013  

## 2011-04-05 NOTE — Patient Instructions (Signed)
#  Shortness of breath Recommend also you go through pulmonary rehab exercise program; thanks for agreeing Return to see me in 4-5 months to review how things are going Come sooner if there are problems #Pulmonary nodule  If you remember this was noted April 2012 You need repeat CT chest without contrast April 2013. My nurse will ensure there is an order #Followup  - April 2013 after CT chest

## 2011-04-05 NOTE — Assessment & Plan Note (Addendum)
Discussed benefits of rehab. SHe has reluctantly agreed. Refer rehab. Rehceck ONO at daughter's request  > 15 min visit. > 50% in face to face counseling

## 2011-05-07 ENCOUNTER — Telehealth: Payer: Self-pay | Admitting: Internal Medicine

## 2011-05-07 DIAGNOSIS — R0600 Dyspnea, unspecified: Secondary | ICD-10-CM

## 2011-05-07 NOTE — Telephone Encounter (Signed)
   revuewed ono from 04/12/11 - some 21 minutes spent pulse ox <88% but never below 83%. So she can continue to use nocturnal o2. She needs to attend rehab. Hope she has enrolled. There, if no desaturation with exercise then she can give up o2 but till then continue to use

## 2011-05-09 NOTE — Telephone Encounter (Signed)
[email or web address].Avaree Gilberti, CMA  

## 2011-05-09 NOTE — Telephone Encounter (Signed)
Pt's daughter returned call re: results. Call her at [phone] "anytime". Hazel Sams

## 2011-05-13 ENCOUNTER — Encounter: Payer: Self-pay | Admitting: Internal Medicine

## 2011-05-21 NOTE — Telephone Encounter (Signed)
I spoke with the pt daughter and advised of results. She states the pt has not been on nocturnal oxygen in a while. The pt has not attended rehab either because after last OV she was diagnosed with cancer of the female organs and has had a hysterectomy and radiation. Daughter states she just went to doctor today and they have found more cancer so they have sent a referral to Dr. Darnelle Catalan for the pt to discuss chemo.  Daughter does not feel rehab is doable at this time.  She want to have nocturnal oxygen ordered again for the patient, because she feels she did better when she had it. Since the pt is not going to attend rehab do you want me to send an order for nocturnal o2? Please advise. Carron Curie, CMA

## 2011-05-21 NOTE — Telephone Encounter (Signed)
Order placed. Pt family aware. Carron Curie, CMA

## 2011-05-21 NOTE — Telephone Encounter (Signed)
[email or web address].[name], CMA  

## 2011-05-21 NOTE — Telephone Encounter (Signed)
Ok, i [facility] rehab not doable due to cancer recurrence. Let them know once cancer under control or impved and schedule permits would encourage rehab due to its benefit with fatigue  Yes, please set up o2  FU per prior OV plan

## 2011-05-27 ENCOUNTER — Ambulatory Visit (INDEPENDENT_AMBULATORY_CARE_PROVIDER_SITE_OTHER): Payer: No Typology Code available for payment source | Admitting: Cardiology

## 2011-05-27 ENCOUNTER — Encounter: Payer: Self-pay | Admitting: Cardiology

## 2011-05-27 DIAGNOSIS — I6529 Occlusion and stenosis of unspecified carotid artery: Secondary | ICD-10-CM

## 2011-05-27 DIAGNOSIS — R0602 Shortness of breath: Secondary | ICD-10-CM

## 2011-05-27 DIAGNOSIS — I4891 Unspecified atrial fibrillation: Secondary | ICD-10-CM

## 2011-05-27 MED ORDER — FUROSEMIDE 40 MG PO TABS: 40.0000 mg | ORAL_TABLET | Freq: Every day | ORAL | Status: DC

## 2011-05-27 MED ORDER — POTASSIUM CHLORIDE CRYS ER 20 MEQ PO TBCR: 20.0000 meq | EXTENDED_RELEASE_TABLET | Freq: Two times a day (BID) | ORAL | Status: DC

## 2011-05-27 NOTE — Progress Notes (Signed)
PCP: Dr. Jacky Kindle   78 yo with history of paroxysmal atrial fibrillation, nonobstructive CAD, and chronic exertional dyspnea presents for cardiology followup. Her main cardiopulmonary problem has been chronic exertional dyspnea. She has had quite significant shortness of breath for aroudn 3 years now.  Patient has had an extensive workup for dyspnea. She had a right and left heart cath in 2/11 that showed nonobstructive CAD as well as normal pulmonary artery pressure and normal wedge pressure. Echo showed normal LV systolic function. She had a V/Q scan in 7/11 with no evidence for PE. She never smoked. She was seen by pulmonology and it was thought that reflux may be a cause of her symptoms. She was started on bid Protonix with some improvement but really minimal. She was in our office in 3/12 for ETT-myoview as a pre-operative test before surgery for endometrial cancer. There was no evidence for ischemia or infarction on perfusion images or ECG but the patient's oxygen saturation dropped from 96% at rest to 83% at peak exertion. She then had a high-resolution chest CT showing no significant interstitial lung disease but there was evidence for emphysema.  PFTs showed normal spirometry but there was decreased DLCO. She saw pulmonology again and it was thought that she could have a component of emphysema from passive smoking. She also had overnight oximetry with significant desaturation and is on oxygen at night. I also started her on Lasix for a probable component of diastolic CHF.  With treatment of COPD with inhalers, treatment of diastolic CHF with Lasix, and treatment of nocturnal desaturation with oxygen, she felt considerably better.  However, she was taken off her nocturnal oxygen recently with resulting worsening of dyspnea.  She started back on nocturn oxygen about a week ago.  Weight is up 4 lbs since last appointment.  She gets short of breath now with housework like dusting and making beds.  This is  somewhat worse compared to her symptoms at last appointment here.  No tachypalpitations to suggest atrial fibrillation.  She is being treated for uterine cancer.  She had a hysterectomy followed by radiation.  Chemotherapy is planned.   Labs (4/12): hgb 10.6, LDL 84, HDL 68, BNP 98 Labs (6/12): LDL 78, HDL 67  PMH:  1. Atrial fibrillation: Paroxysmal. She was on dronedarone briefly but it was stopped. She is on coumadin.  2. CHF: Diastolic. Echo (2/11) with mild LVH, EF 65-70%, PA systolic pressure 35 mmHg.  3. Carotid stenosis: right CEA in 2011.  4. Endometrial cancer s/p hysterectomy 4/12.  She had radiation after this.  5. HTN  6. Anemia  7. GERD/hiatal hernia  8. Hypothyroidism  9. TIA  10. CAD: LHC (2/11) with 40% LAD stenosis, 50% mCFX stenosis. ETT-myoview (3/12): 5:31 exercise, no ECG changes, oxygen saturation dropped from 96% at rest to 83% with exertion, no ischemia or infarction on perfusion images.  11. Dyspnea with exertion: Extensive workup in 2011. Echo and left heart cath as above. Right heart cath with mean PA pressure 23 mmHg and PCWP 11 mmHg. V/Q scan (7/11) low probability. PFTs (2/11) normal except for mildly decreased DLCO. ETT-myoview as above. O2 sats dropped with exercise. Patient was evaluated by pulmonology, thought to have GERD as cause of dyspnea.  CT chest without contrast (high resolution) in 4/12 showed emphysema but no interstitial lung disease.  PFTs (4/12) with normal spirometry but decreased DLCO.  Nocturnal oximetry showed desaturation and she is now on oxygen at night.  Cardiopulmonary exercise test suggested a  component of diastolic dysfunction.  Dyspnea thought to be multifactorial with emphysema, deconditioning, and diastolic CHF thought to play a role.   SH: Married, never smoked, retired. Lives in [place]. Daughter is involved in her care.   FH: CAD   ROS: All systems reviewed and negative except as per HPI.   Current Outpatient Prescriptions    Medication Sig Dispense Refill  . aspirin 81 MG tablet Take 81 mg by mouth daily.        . bisoprolol (ZEBETA) 5 MG tablet Take 5 mg by mouth daily.        . Calcium Carbonate (CALTRATE 600 PO) Take by mouth daily.        Marland Kitchen diltiazem (CARDIZEM CD) 240 MG 24 hr capsule TAKE ONE CAPSULE BY MOUTH EVERY DAY  90 capsule  3  . iron polysaccharides (NIFEREX) 150 MG capsule Take 150 mg by mouth 2 (two) times daily.        Marland Kitchen levothyroxine (SYNTHROID, LEVOTHROID) 50 MCG tablet Take 50 mcg by mouth daily.        . Melatonin 3-10 MG TABS Take 1 tablet by mouth every evening.        . nitroGLYCERIN (NITROSTAT) 0.4 MG SL tablet Place 0.4 mg under the tongue every 5 (five) minutes as needed.        . pantoprazole (PROTONIX) 40 MG tablet Take 1 tablet by mouth Daily.      Marland Kitchen QVAR 80 MCG/ACT inhaler INHALE 2 PUFFS INTO THE LUNGS 2 TIMES DAILY.  1 Inhaler  6  . simvastatin (ZOCOR) 40 MG tablet Take 1 tablet (40 mg total) by mouth every evening.  30 tablet  6  . warfarin (COUMADIN) 5 MG tablet Take 5 mg by mouth daily. As directed per Coumadin clinic       . DISCONTD: furosemide (LASIX) 40 MG tablet Take 1  alternating with 1/2 daily  25 tablet  6  . DISCONTD: potassium chloride SA (K-DUR,KLOR-CON) 20 MEQ tablet Take 2 tablets one day and 1 tablet the next day, alternating days  50 tablet  6  . furosemide (LASIX) 40 MG tablet Take 1 tablet (40 mg total) by mouth daily.  30 tablet  6  . potassium chloride SA (K-DUR,KLOR-CON) 20 MEQ tablet Take 1 tablet (20 mEq total) by mouth 2 (two) times daily.  60 tablet  6    BP 140/62  Pulse 55  Ht 5\' 1"  (1.549 m)  Wt 63.05 kg (139 lb)  BMI 26.26 kg/m2 BP 135/65  HR 60 General: NAD  Neck: JVP not elevated, no thyromegaly or thyroid nodule.  Lungs: Clear to auscultation bilaterally with normal respiratory effort.  CV: Nondisplaced PMI. Heart regular S1/S2, no S3/S4, no murmur. No peripheral edema. No carotid bruit. Normal pedal pulses.  Abdomen: Soft, nontender, no  hepatosplenomegaly, no distention.  Neurologic: Alert and oriented x 3.  Psych: Normal affect.  Extremities: No clubbing or cyanosis.

## 2011-05-27 NOTE — Assessment & Plan Note (Signed)
Paroxysmal.  She is in sinus rhythm now.  No recent atrial fibrillation-type symptoms.  She is no longer taking dronedarone, and it does not seem like she really needs it.  She is on coumadin. She can stop aspirin as there is no benefit to this in addition to coumadin.

## 2011-05-27 NOTE — Assessment & Plan Note (Signed)
Goal LDL with known vascular disease (s/p right CEA) is < 70.  Will get lipids.

## 2011-05-27 NOTE — Patient Instructions (Signed)
Increase Lasix(furosemide) to 40mg  daily.  Increase KCL(potassium) to 40 mEq daily. This will be two 20 mEq tablets daily.  Your physician recommends that you return for a FASTING lipid profile /liver profile/BMET in 2 weeks--786.05  427.31   Your physician wants you to follow-up in: 6 months with Dr Shirlee Latch.(July 2013) You will receive a reminder letter in the mail two months in advance. If you don't receive a letter, please call our office to schedule the follow-up appointment.

## 2011-05-27 NOTE — Assessment & Plan Note (Signed)
Multifactorial but I suspect that the significant contributors are diastolic CHF, COPD, and nocturnal desaturation.  She did better on Lasix, Qvar, and oxygen at night.  Cardiopulmonary exercise test suggested that diastolic dysfunction is present.  The treatment for this will be blood pressure control and lowering filling pressures with Lasix.  She has felt more short of breath recently.   - Increase Lasix to 40 mg daily to see if there is symptomatic relief.  BMET/BNP in 2 wks.  - Needs to stay on nocturnal oxygen.  - Pulmonary rehab would be helpful.  She plans to delay this until after her chemotherapy.

## 2011-05-31 ENCOUNTER — Telehealth: Payer: Self-pay | Admitting: Oncology

## 2011-05-31 NOTE — Telephone Encounter (Signed)
Talked to pt,daughter, she is aware of appt date  and location

## 2011-05-31 NOTE — Telephone Encounter (Signed)
Called patient's daughter, Sheralyn Boatman regarding chemo education appointment on 2/6 at 10am.  Confirmed appointment and confirmed appointment on 2/12 at 9:30 with [facility].

## 2011-06-03 ENCOUNTER — Telehealth: Payer: Self-pay | Admitting: Oncology

## 2011-06-03 ENCOUNTER — Other Ambulatory Visit: Payer: Self-pay | Admitting: Oncology

## 2011-06-03 NOTE — Telephone Encounter (Signed)
Referred by [facility] Dx- Endometrial Ca

## 2011-06-05 ENCOUNTER — Other Ambulatory Visit: Payer: No Typology Code available for payment source

## 2011-06-09 ENCOUNTER — Other Ambulatory Visit: Payer: Self-pay | Admitting: Oncology

## 2011-06-09 DIAGNOSIS — C541 Malignant neoplasm of endometrium: Secondary | ICD-10-CM

## 2011-06-10 ENCOUNTER — Other Ambulatory Visit (INDEPENDENT_AMBULATORY_CARE_PROVIDER_SITE_OTHER): Payer: Medicare Other | Admitting: *Deleted

## 2011-06-10 DIAGNOSIS — R0602 Shortness of breath: Secondary | ICD-10-CM

## 2011-06-10 DIAGNOSIS — C541 Malignant neoplasm of endometrium: Secondary | ICD-10-CM

## 2011-06-10 DIAGNOSIS — I4891 Unspecified atrial fibrillation: Secondary | ICD-10-CM

## 2011-06-10 DIAGNOSIS — Z79899 Other long term (current) drug therapy: Secondary | ICD-10-CM

## 2011-06-10 LAB — BRAIN NATRIURETIC PEPTIDE: Pro B Natriuretic peptide (BNP): 103 pg/mL — ABNORMAL HIGH (ref 0.0–100.0)

## 2011-06-10 LAB — BASIC METABOLIC PANEL
CO2: 27 mEq/L (ref 19–32)
Chloride: 107 mEq/L (ref 96–112)
GFR: 40.26 mL/min — ABNORMAL LOW (ref >60.00)
Glucose, Bld: 95 mg/dL (ref 70–99)
Potassium: 4.4 mEq/L (ref 3.5–5.1)
Sodium: 140 mEq/L (ref 135–145)

## 2011-06-11 ENCOUNTER — Other Ambulatory Visit (HOSPITAL_BASED_OUTPATIENT_CLINIC_OR_DEPARTMENT_OTHER): Payer: Medicare Other | Admitting: Lab

## 2011-06-11 ENCOUNTER — Ambulatory Visit (HOSPITAL_BASED_OUTPATIENT_CLINIC_OR_DEPARTMENT_OTHER): Payer: Medicare Other | Admitting: Oncology

## 2011-06-11 ENCOUNTER — Telehealth: Payer: Self-pay | Admitting: Oncology

## 2011-06-11 ENCOUNTER — Ambulatory Visit: Payer: Medicare Other

## 2011-06-11 VITALS — BP 143/74 | HR 61 | Temp 97.0°F | Ht 61.0 in | Wt 140.1 lb

## 2011-06-11 DIAGNOSIS — C549 Malignant neoplasm of corpus uteri, unspecified: Secondary | ICD-10-CM

## 2011-06-11 DIAGNOSIS — C541 Malignant neoplasm of endometrium: Secondary | ICD-10-CM

## 2011-06-11 DIAGNOSIS — I2510 Atherosclerotic heart disease of native coronary artery without angina pectoris: Secondary | ICD-10-CM

## 2011-06-11 DIAGNOSIS — I4891 Unspecified atrial fibrillation: Secondary | ICD-10-CM

## 2011-06-11 DIAGNOSIS — I10 Essential (primary) hypertension: Secondary | ICD-10-CM

## 2011-06-11 LAB — COMPREHENSIVE METABOLIC PANEL
ALT: 51 U/L — ABNORMAL HIGH (ref 0–35)
Albumin: 4.5 g/dL (ref 3.5–5.2)
Alkaline Phosphatase: 118 U/L — ABNORMAL HIGH (ref 39–117)
CO2: 30 mEq/L (ref 19–32)
Potassium: 4.8 mEq/L (ref 3.5–5.3)
Sodium: 142 mEq/L (ref 135–145)
Total Bilirubin: 0.3 mg/dL (ref 0.3–1.2)
Total Protein: 6.9 g/dL (ref 6.0–8.3)

## 2011-06-11 LAB — CBC WITH DIFFERENTIAL/PLATELET
BASO%: 0.6 % (ref 0.0–2.0)
EOS%: 3.9 % (ref 0.0–7.0)
LYMPH%: 23.3 % (ref 14.0–49.7)
MCHC: 34.2 g/dL (ref 31.5–36.0)
MCV: 87.4 fL (ref 79.5–101.0)
MONO%: 13.7 % (ref 0.0–14.0)
Platelets: 196 10*3/uL (ref 145–400)
RBC: 3.73 10*6/uL (ref 3.70–5.45)
WBC: 4.8 10*3/uL (ref 3.9–10.3)

## 2011-06-11 NOTE — Telephone Encounter (Signed)
Gv pt appt for feb2013 °

## 2011-06-11 NOTE — Progress Notes (Signed)
[facility] NEW PATIENT EVALUATION   Name: [name] Date: 06/11/2011 MRN: [id number] DOB: 06-10-1923  Physicians: Hazle Coca, R.Jacky Kindle, D.McLean, JDLawson, J.Kinard, M.Ramaswamy, T.Fontaine   REASON FOR REFERRAL: vaginal recurrence of high grade endometrial carcinoma.    HISTORY OF PRESENT ILLNESS:[name] is a 78 y.o. female who is seen, together with a daughter and a granddaughter, in consultation at the request of Dr.Samuel Noland Fordyce for consideration of systemic chemotherapy for recurrent endometrial carcinoma.  Some records from [facility] have been received, not all of which are yet scanned into our EMR. Patient had presented with vaginal bleeding Feb 2012, with endometrial biopsy by Dr.TimFontaine  06-04-2010 [facility] [phone]) with high grade poorly differentiated endometrial carcinoma. She was referred to.  Dr.Lentz and had TAH/BSO 08-17-2010  (op note and surgical path not included in records available to me now) for IA serous carcinoma of uterus. She was found to have 2mm recurrence at vaginal apex in July 2012, with CT CAP reportedly negative for other involvement then. She received vaginal brachytherapy 3000cGy by Dr.Kinard thru 01-17-11. Exam by Dr.Lentz 04-18-11 found a 2-3 mm right posterior apical vaginal lesion, with path ([facility] [phone]) invasive serous adenocarcinoma, recurrent. CT CAP at [facility] 05-07-2011 without IV contrast reportedly showed no definite CT evidence of recurrent or metastatic disease.(The scans were otherwise remarkable for 7 mm RLL pulmonary nodule unchanged back to 05-17-10, biapical pulmonary scarring, emphysema,  atherosclerotic calcifications in aorta and coronary arteries, unchanged small cystic lesion in head of pancreas, unchanged 1.5 cm right adrenal nodule, atrophic left kidney, no ascites, diffuse osteoporosis.) Note from Dr.Lentz 05-17-2011 states that he discussed the case with Dr.Kinard "he does not feel the  patient is a candidate for further brachytherapy and is not overly enthusiastic about the pelvic radiation", such that Dr.Lentz would consider systemic chemotherapy with single agent carboplatin. Patient has requested treatment in [place], thus referral here. She does report some very minimal blood-tinged vaginal discharge x 3-4 weeks. She denies pelvic or abdominal pain.  Primary care is by Dr.Richard Jacky Kindle. She sees Dr.Dalton Shirlee Latch (previously Durenda Age) for cardiac problems including paroxysmal afib on coumadin (monitored by Dr.Aronson), CAD, HTN x yrs, elevated cholesterol. Last visit with Dr.McLean was 05-27-11. She is followed by [facility] for chronic exertional dyspnea felt secondary to diastolic dysfunction, on home O2 at night; last visit there 05-07-11. She is known to Dr.JD.Hart [place] post right carotid endarterectomy 2011; also probable renal artery stenosis.   REVIEW OF SYSTEMS: some fatigue, chronic and unchanged. Good appetite, weight usually 135 - 142 lbs. Uses oxygen at hs and inhalers regularly.No GERD.No change in bowels. Occasional bladder infections, no symptoms now. No LE swelling on lasix. No other bleeding, no history of blood clots. No residual problems from gyn onc surgery or brachytherapy. No arthritis except right shoulder which was injured in a fall, had been due to begin PT when cancer diagnosis made. Per family, memory has been somewhat poor x several years tho she still is very functional, but memory worse with stress of present situation. No chest pain or other new cardiac symptoms.   ALLERGIES: Albuterol which makes her "weak and shaky"  PAST MEDICAL HISTORY:  has a past medical history of TIA (transient ischemic attack); Unspecified hypothyroidism; Unspecified essential hypertension; Allergic rhinitis, cause unspecified; Arrhythmia; Hiatal hernia; and SOB (shortness of breath).  Paroxysmal A fib on coumadin.  Coronary artery disease. Peripheral vascular disease  (carotid, renal artery).  Remote cataract surgery. Remote bilateral tubal ligation.  She did  have flu vaccine; she is up to date on TDAP, pneumovax, zostavax. Last mammograms 05-30-2009.  CURRENT MEDICATIONS: reviewed in EMR     SOCIAL HISTORY: Patient is from [place], lives in [place] with husband (who has DM). She has never smoked and does not use Etoh. She worked for 19 years at [employer] after her 7 children were grown. All of her children live in this area; she also has 8 grands and 7 great-grands. The daughter who accompanies her is a breast cancer survivor and is very involved in her medical care.l  FAMILY HISTORY: cardiac disease and HTN. Daughter with breast cancer > 10 years ago, son with prostate cancer, brother died with MI age 34, sister died with some type of metastatic cancer.  LABORATORY DATA: available after visit    WBC 4.8, ANC 2.8 HGB 11.2, MCV 87.4 plt 196k Differential not remarkable CMET with normal electrolytes, BUN 23, creat 1.4, AP 118, AST 47, ALT 51, Tprot 6.9, alb 4.5, Tbili 0.3, ca 9.3  RADIOGRAPHY: CT CAP from [facility] 05-07-2011 as above        PHYSICAL EXAM:  height is 5\' 1"  (1.549 m) and weight is 140 lb 1.6 oz (63.549 kg). Her temperature is 97 F (36.1 C). Her blood pressure is 143/74 and her pulse is 61.  Alert, not in obvious discomfort, respirations not labored on RA, ambulatory without assistance, good historian. Family very supportive  HEENT: PERRL, not icteric. Normal hair pattern. Oral mucosa moist and clear, post pharynx likewise. Neck supple without JVD Lymphatics: no cervical, supraclavicular, axillary, inguinal adenopathy Lungs clear to auscultation and percussion Back nontender  Skin without rash, ecchymosis, petechiae Breasts bilaterally without dominant mass or other findings of concern including right lateral where she has referred discomfort from shoulder. Cor irregular rate and rhythm Abdomen soft and nontender, not distended,  normal bowel sounds, no appreciable HSM or masses LE without pitting edema, cords tenderness Neuro nonfocal.     Patient and family had attended chemotherapy education class prior to this visit; I have clarified that single agent carboplatin q 3 weeks would be consideration (not taxol = carboplatin). Patient and family are still understandably anxious about side effects of chemotherapy in this 78 yo lady with other significant comorbidities and very minimal recurrence identified now, tho certainly this is high grade carcinoma. We have requested consultation visit with Dr.Kinard, which I have been able to schedule for Feb 25. I will see her back after his visit on Feb 27. Patient and family are appreciative of our discussion and in agreement with this plan; they understand that they can call if they have further questions or concerns prior to next scheduled meeting.  IMPRESSION/PLAN:  1.High grade serous endometrial carcinoma post TAH/BSO 07-2010, then vaginal recurrence 10-2010 treated with brachytherapy, now further small vaginal recurrence. Full consultation with Dr.Kinard pending.  2.CAD, HTN, A fib on coumadin, chronic respiratory problems on home O2, peripheral vascular disease, chronic renal insufficiency with atrophic kidney by CT.      Reece Packer, MD 06/11/2011 9:37 PM

## 2011-06-17 ENCOUNTER — Encounter: Payer: Self-pay | Admitting: Oncology

## 2011-06-17 DIAGNOSIS — C541 Malignant neoplasm of endometrium: Secondary | ICD-10-CM | POA: Insufficient documentation

## 2011-06-18 ENCOUNTER — Telehealth: Payer: Self-pay | Admitting: Oncology

## 2011-06-18 NOTE — Telephone Encounter (Signed)
After discussing with gyn onc MD here, spoke with daughter by phone, ? If any limited surgical resection possible. Daughter would like for me to try to reach Dr.Lentz with this question.

## 2011-06-19 ENCOUNTER — Telehealth: Payer: Self-pay | Admitting: Oncology

## 2011-06-19 NOTE — Telephone Encounter (Signed)
Spoke with Dr.Samuel Noland Fordyce now. He doubts local resection would really give significant benefit/ control with this histology, but understands that chemotherapy certainly may not be benign with other comorbidities; he agrees with RT consult pending. He is glad to see her at any time if needed and we will stay in touch about her.

## 2011-06-20 ENCOUNTER — Other Ambulatory Visit: Payer: Self-pay | Admitting: Cardiology

## 2011-06-24 ENCOUNTER — Ambulatory Visit
Admission: RE | Admit: 2011-06-24 | Discharge: 2011-06-24 | Disposition: A | Discharge status: Home / Self Care | Payer: Medicare Other | Source: Ambulatory Visit | Attending: Radiation Oncology | Admitting: Radiation Oncology

## 2011-06-24 VITALS — BP 141/79 | HR 59 | Temp 97.5°F | Wt 141.1 lb

## 2011-06-24 DIAGNOSIS — C541 Malignant neoplasm of endometrium: Secondary | ICD-10-CM

## 2011-06-24 NOTE — Progress Notes (Signed)
CC:   Reece Packer, M.D. Hazle Coca, MD Geoffry Paradise, M.D.  DIAGNOSIS:  Recurrent endometrial cancer.  INTERVAL SINCE RADIATION THERAPY:  Five months.  The patient completed intracavitary brachytherapy alone as part of her recurrence.  NARRATIVE:  Mrs. Kushnir comes in today for further followup.  Since her intracavitary brachytherapy treatments, the patient was found to have an approximately 2 to 3-mm raised lesion along the right posterior apical vagina.  Biopsy of this area by Dr. Noland Fordyce on April 19, 2011, did show invasive serous adenocarcinoma.  In light of this recurrence, the patient underwent a CT scan of the chest, abdomen, and pelvis at [facility].  This showed no evidence of metastatic spread.  The patient's biopsy-proven recurrence was too small to image on CT scans.  In light of additional recurrence after her vaginal brachytherapy, the patient was subsequent seen by Dr. Darrold Span who is considering "mild chemotherapy" given the patient's advanced age and medical issues.  The patient is now seen in Radiation Oncology to see if she may be a potential candidate for additional radiation therapy.  REVIEW OF SYSTEMS:  The patient has some mild vaginal spotting, but no significant bleeding.  The patient denies any pelvic pain, urination difficulties, or bowel complaints.  The patient denies any flank or low back pain.  PHYSICAL EXAMINATION:  Vital signs: The patient is weight is 141 pounds which is stable.  Temperature is 97.5, pulse 59, blood pressure is 141/79.  Examination of the neck and supraclavicular region reveals some thickening in the left supraclavicular fossa, but no discrete adenopathy is palpable.  The axillary areas are free of adenopathy.  Examination of the lungs reveals them to be clear.  The heart has a regular rhythm and rate.  Examination of the abdomen reveals it to be soft and nontender with normal bowel sounds.  The inguinal areas are free  of adenopathy. On pelvic examination, the external genitalia are unremarkable.  A speculum exam is performed.  There is a lesion on the right vaginal apex/ lateral portion of the patient's surgical suture line, which is estimated to be approximately 5 mm in size.  This is somewhat erythematous and bleeds with manipulation.  In addition, along the left apical area, there are erythematous changes and granulation tissue also suspicious for malignancy.  On bimanual and rectovaginal examination, there are no pelvic masses appreciated.  IMPRESSION AND PLAN:  Recurrent endometrial cancer as above.  The patient has had an additional recurrence after her intracavitary brachytherapy treatments.  Given the location of the patient's recurrence and the fact that she has had high-dose rate brachytherapy in this area, she would not be a candidate for additional brachytherapy treatments. The patient would be a candidate for external beam radiation therapy, but this would be a significant volume. Whereas, her recurrence is quite superficial in the vaginal apex region.  Dr. Noland Fordyce has spoken with Dr. Darrold Span and the patient would not be a candidate for limited resection of this area.  As above, the patient may proceed with systemic chemotherapy.  The patient will be meeting with Dr. Darrold Span later this week.    ______________________________ Billie Lade, Ph.D., M.D. JDK/MEDQ  D:  06/24/2011  T:  06/24/2011  Job:  [id number]

## 2011-06-24 NOTE — Progress Notes (Signed)
Here for routine follow up. Has some slight bleeding at times. Daughter states a new area of cancer found by Dr.Lentz. No further radiation but considering chemotherapy.

## 2011-06-26 ENCOUNTER — Other Ambulatory Visit: Payer: Medicare Other | Admitting: Lab

## 2011-06-26 ENCOUNTER — Telehealth: Payer: Self-pay | Admitting: Oncology

## 2011-06-26 ENCOUNTER — Ambulatory Visit (HOSPITAL_BASED_OUTPATIENT_CLINIC_OR_DEPARTMENT_OTHER): Payer: Medicare Other | Admitting: Oncology

## 2011-06-26 VITALS — BP 129/73 | HR 64 | Temp 97.3°F | Ht 61.0 in | Wt 139.8 lb

## 2011-06-26 DIAGNOSIS — C541 Malignant neoplasm of endometrium: Secondary | ICD-10-CM

## 2011-06-26 DIAGNOSIS — Z7901 Long term (current) use of anticoagulants: Secondary | ICD-10-CM

## 2011-06-26 DIAGNOSIS — I4891 Unspecified atrial fibrillation: Secondary | ICD-10-CM

## 2011-06-26 DIAGNOSIS — C549 Malignant neoplasm of corpus uteri, unspecified: Secondary | ICD-10-CM

## 2011-06-26 DIAGNOSIS — D649 Anemia, unspecified: Secondary | ICD-10-CM

## 2011-06-26 LAB — BASIC METABOLIC PANEL
BUN: 24 mg/dL — ABNORMAL HIGH (ref 6–23)
Chloride: 104 mEq/L (ref 96–112)
Creatinine, Ser: 1.58 mg/dL — ABNORMAL HIGH (ref 0.50–1.10)

## 2011-06-26 LAB — CBC WITH DIFFERENTIAL/PLATELET
Basophils Absolute: 0 10*3/uL (ref 0.0–0.1)
EOS%: 2.1 % (ref 0.0–7.0)
HCT: 30.8 % — ABNORMAL LOW (ref 34.8–46.6)
HGB: 10.5 g/dL — ABNORMAL LOW (ref 11.6–15.9)
MCH: 29.8 pg (ref 25.1–34.0)
MCV: 87.6 fL (ref 79.5–101.0)
NEUT%: 64.3 % (ref 38.4–76.8)
lymph#: 0.9 10*3/uL (ref 0.9–3.3)

## 2011-06-26 NOTE — Patient Instructions (Signed)
Dr. Hazle Coca April 18 at 4:15  Pulmonary rehab between now and his appointment would be a good idea.

## 2011-06-26 NOTE — Telephone Encounter (Signed)
Gv pt appt for april2013 °

## 2011-06-27 ENCOUNTER — Encounter: Payer: Self-pay | Admitting: Oncology

## 2011-06-27 NOTE — Progress Notes (Signed)
OFFICE PROGRESS NOTE Date of Visit 06-26-2011 Physicians: S.Noland Fordyce, R.Eulis Canner.Ramaswamy, D.McLean, J.Kinard  INTERVAL HISTORY:  Patient is seen, together with 2 daughters, in continuing attention to her endometrial carcinoma, which is known locally recurrent to vaginal cuff. Since I met her 06-11-11, I have discussed case with Dr. Noland Fordyce and patient has seen Dr.Kinard for reconsultation. i have also discussed with Dr.Kinard following that visit. Patient had presented with vaginal bleeding Feb 2012, with endometrial biopsy by Dr.TimFontaine 06-04-2010 [facility] [phone]) with high grade poorly differentiated endometrial carcinoma. She was referred to. Dr.Lentz and had TAH/BSO 08-17-2010 (op note and surgical path not included in records available to me now) for IA serous carcinoma of uterus. She was found to have 2mm recurrence at vaginal apex in July 2012, with CT CAP reportedly negative for other involvement then. She received vaginal brachytherapy 3000cGy by Dr.Kinard thru 01-17-11. Exam by Dr.Lentz 04-18-11 found a 2-3 mm right posterior apical vaginal lesion, with path ([facility] [phone]) invasive serous adenocarcinoma. CT CAP at [facility] 05-07-2011 without IV contrast reportedly showed no definite CT evidence of recurrent or metastatic disease. Situation is complicated by other significant comorbidities, particularly paroxysmal a-fib on coumadin, exertional dyspnea from dyastolic dysfunction, nocturnal O2 requirement and peripheral vascular disease. Patient is having no significant symptoms from the vaginal involvement, tho she has seen some very minimal spotting intermittently. She is particularly having no abdominal or pelvic discomfort, no changes in bowels or bladder, no abdominal distension. She remains symptomatic from the respiratory problems, with more shortness of breath in last few days. She is comfortable at rest in exam room, is short of breath slowly walking in hall and tells me  that she has to sit and rest as she does light activity such as dusting at home.She denies chest pain, cough or sputum and has had no fever.   Review of Systems otherwise: no other bleeding. No LE swelling. No other pain. Appetite at baseline. Is able to sleep. No HA or other neuro symptoms. Remainder of 10 point Review of Systems negative.  Dr.Kinard cannot treat with further brachytherapy and feels that volume of whole pelvic RT would not be perhaps reasonable for the very limited involvement, which he estimated 5 mm at surgical suture line now. Dr.Lentz is particularly concerned given the serous pathology that she may progress if only local excision is attempted. Certainly her other medical problems probably increase surgical and anesthesia risk.   Objective:  Vital signs in last 24 hours:  BP 129/73  Pulse 64  Temp(Src) 97.3 F (36.3 C) (Oral)  Ht 5\' 1"  (1.549 m)  Wt 139 lb 12.8 oz (63.413 kg)  BMI 26.42 kg/m2  Alert, looks comfortable with minimal activity in exam room. Daughters are very supportive. HEENT:mucous membranes moist, pharynx normal without lesions. PERRL LymphaticsCervical, supraclavicular, and axillary nodes normal.No inguinal adenopathy Resp: clear to auscultation bilaterally and normal percussion bilaterally. Respirations not labored Cardio: regular rate and rhythm GI: soft, non-tender; bowel sounds normal; no masses,  no organomegaly, not distended Extremities: extremities normal, atraumatic, no cyanosis or edema   Lab Results:   Basename 06/26/11 1350  WBC 4.4  HGB 10.5*  HCT 30.8*  PLT 187    BMET  Basename 06/26/11 1350  NA 140  K 4.3  CL 104  CO2 27  GLUCOSE 94  BUN 24*  CREATININE 1.58*  CALCIUM 9.2    Studies/Results:  No results found.  Medications: I have reviewed the patient's current medications.  We have again discussed at length  options for care. 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 We discussed fact that systemic chemotherapy now may decrease quality of life without giving more benefit if used now than if used later. They understand that local resection and close observation might not give long-term disease free benefit either. They seemed particularly reassured by suggestion that she try to proceed with several weeks of pulmonary rehab, as her pulmonologist had suggested, in hopes that this will improve the problems that are actually bothersome to her now and possibly improve tolerance for either local resection or chemotherapy. Patient would like to talk with Dr.Lentz about local resection followed by close observation; I have spoken with his clinic and set up an appointment back to him April 18, as his next available. I have told her that we could carefully try a chemotherapy treatment before Dr.Lentz' appointment, however at this time she seems more inclined to proceed with the pulmonary rehab first. I am glad to see her back at any time if needed prior to her appointment with Dr.Lentz (or she can call if she decides to try the chemotherapy); if not, I will see her later in April.  Assessment/Plan:  1. Vaginal recurrence of serous endometrial carcinoma: considerations for care as above. Appointment scheduled for follow up with Dr.Lentz. 2.Chronic respiratory problems related to cardiac disease: pulmonary rehab recommended by pulmonologist. See above. 3.Atrial fibrillation on chronic coumadin 4.PVD with previous right carotid endarterectomy and probable renal artery stenosis 5.mild anemia doubt related to gyn cancer. 6.chronic renal insufficiency with atrophic kidney by scan   Patient and daughters seemed to follow the discussion well and were in agreement with plan.    Eugene Zeiders P, MD   06/27/2011, 1:10 PM

## 2011-06-28 ENCOUNTER — Telehealth: Payer: Self-pay | Admitting: Internal Medicine

## 2011-06-28 NOTE — Telephone Encounter (Signed)
Spoke with Lockie Mola will check with [name] on Monday to see if they can start process based off previous order in 03-2011. I have left a message for Sheralyn Boatman that we are working on this and if any questions or concerns then call our office.

## 2011-06-29 ENCOUNTER — Other Ambulatory Visit: Payer: Self-pay | Admitting: Cardiology

## 2011-06-30 ENCOUNTER — Encounter: Payer: Self-pay | Admitting: Oncology

## 2011-06-30 NOTE — Progress Notes (Signed)
My consultation note 06-11-11, Dr.Kinard's reconsultation note 06-24-11 and my office note 06-26-11 all routed now to Dr.Samuel Noland Fordyce at [facility].

## 2011-07-01 NOTE — Telephone Encounter (Signed)
Spoke with [name] over at [facility]. Referral was placed in 12/12 for pulmonary rehab by Dr. Marchelle Gearing. Pt never was able to go, so referral is still current. [name] will return on Tues. 07/02/11 and will advise [place] if anything is different. Called and spoke with pt's daughter Francie Massing and she was advised that we do not need to send another referral, that Pulmonary Rehab should be able to schedule pt off this order. Gave daughter [facility] phone number and advised her that the wait time is 4-6 wks out. If she needs anything further, to please contact us. Rhonda J Cobb

## 2011-07-09 ENCOUNTER — Encounter (HOSPITAL_COMMUNITY): Payer: Self-pay

## 2011-07-09 ENCOUNTER — Encounter (HOSPITAL_COMMUNITY)
Admission: RE | Admit: 2011-07-09 | Discharge: 2011-07-09 | Disposition: A | Discharge status: Home / Self Care | Payer: Medicare Other | Source: Ambulatory Visit | Attending: Internal Medicine | Admitting: Internal Medicine

## 2011-07-09 DIAGNOSIS — R0989 Other specified symptoms and signs involving the circulatory and respiratory systems: Secondary | ICD-10-CM | POA: Insufficient documentation

## 2011-07-09 DIAGNOSIS — Z8673 Personal history of transient ischemic attack (TIA), and cerebral infarction without residual deficits: Secondary | ICD-10-CM | POA: Insufficient documentation

## 2011-07-09 DIAGNOSIS — I2510 Atherosclerotic heart disease of native coronary artery without angina pectoris: Secondary | ICD-10-CM | POA: Insufficient documentation

## 2011-07-09 DIAGNOSIS — I739 Peripheral vascular disease, unspecified: Secondary | ICD-10-CM | POA: Insufficient documentation

## 2011-07-09 DIAGNOSIS — I4891 Unspecified atrial fibrillation: Secondary | ICD-10-CM | POA: Insufficient documentation

## 2011-07-09 DIAGNOSIS — R0609 Other forms of dyspnea: Secondary | ICD-10-CM | POA: Insufficient documentation

## 2011-07-09 DIAGNOSIS — J984 Other disorders of lung: Secondary | ICD-10-CM | POA: Insufficient documentation

## 2011-07-09 DIAGNOSIS — I10 Essential (primary) hypertension: Secondary | ICD-10-CM | POA: Insufficient documentation

## 2011-07-09 DIAGNOSIS — I5032 Chronic diastolic (congestive) heart failure: Secondary | ICD-10-CM | POA: Insufficient documentation

## 2011-07-09 DIAGNOSIS — Z5189 Encounter for other specified aftercare: Secondary | ICD-10-CM | POA: Insufficient documentation

## 2011-07-09 DIAGNOSIS — E039 Hypothyroidism, unspecified: Secondary | ICD-10-CM | POA: Insufficient documentation

## 2011-07-09 DIAGNOSIS — J438 Other emphysema: Secondary | ICD-10-CM | POA: Insufficient documentation

## 2011-07-09 DIAGNOSIS — I6529 Occlusion and stenosis of unspecified carotid artery: Secondary | ICD-10-CM | POA: Insufficient documentation

## 2011-07-09 DIAGNOSIS — I509 Heart failure, unspecified: Secondary | ICD-10-CM | POA: Insufficient documentation

## 2011-07-09 HISTORY — DX: Hyperlipidemia, unspecified: E78.5

## 2011-07-09 HISTORY — DX: Malignant neoplasm of uterus, part unspecified: C55

## 2011-07-09 HISTORY — DX: Occlusion and stenosis of unspecified carotid artery: I65.29

## 2011-07-09 NOTE — Progress Notes (Signed)
[name] here today for orientation to Pulmonary Rehab, accompanied by her daughter.  Health history and medications reviewed .  Demonstration and practice of PLB using pulse oximeter.  Patient able to return demonstration satisfactorily. Safety and hand hygiene in the exercise area reviewed with patient.  Patient voices understanding.  6 minute walk test done.  She did have shortness of breath on test however she walk the complete 983 ft in the 6 minutes without rest break.  She plans to start exercise on  Thursday, 07-11-10.

## 2011-07-11 ENCOUNTER — Encounter (HOSPITAL_COMMUNITY): Payer: Medicare Other

## 2011-07-16 ENCOUNTER — Encounter (HOSPITAL_COMMUNITY): Payer: Medicare Other

## 2011-07-18 ENCOUNTER — Encounter (HOSPITAL_COMMUNITY): Payer: Medicare Other

## 2011-07-22 ENCOUNTER — Ambulatory Visit: Payer: Medicare Other | Admitting: Radiation Oncology

## 2011-07-23 ENCOUNTER — Encounter (HOSPITAL_COMMUNITY)
Admission: RE | Admit: 2011-07-23 | Discharge: 2011-07-23 | Disposition: A | Discharge status: Home / Self Care | Payer: Medicare Other | Source: Ambulatory Visit | Attending: Internal Medicine | Admitting: Internal Medicine

## 2011-07-23 NOTE — Progress Notes (Signed)
First day of exercise.  Mrs Pritz tolerated exercise well, vital signs were stable and oxygen levels remained above 90% with exercise.  She was oriented to equipment use, safety in the exercise area and on equipment.  Demonstration and practice of PLB on each exercise station. Reviewed RPE and Dyspnea scale and rest breaks.  Tolerated well.  Delightful Lady!

## 2011-07-25 ENCOUNTER — Encounter (HOSPITAL_COMMUNITY)
Admission: RE | Admit: 2011-07-25 | Discharge: 2011-07-25 | Disposition: A | Discharge status: Home / Self Care | Payer: Medicare Other | Source: Ambulatory Visit | Attending: Internal Medicine | Admitting: Internal Medicine

## 2011-07-28 DIAGNOSIS — C549 Malignant neoplasm of corpus uteri, unspecified: Secondary | ICD-10-CM | POA: Insufficient documentation

## 2011-07-30 ENCOUNTER — Encounter (HOSPITAL_COMMUNITY)
Admission: RE | Admit: 2011-07-30 | Discharge: 2011-07-30 | Disposition: A | Discharge status: Home / Self Care | Payer: Medicare Other | Source: Ambulatory Visit | Attending: Internal Medicine | Admitting: Internal Medicine

## 2011-07-30 DIAGNOSIS — Z5189 Encounter for other specified aftercare: Secondary | ICD-10-CM | POA: Insufficient documentation

## 2011-07-30 DIAGNOSIS — I10 Essential (primary) hypertension: Secondary | ICD-10-CM | POA: Insufficient documentation

## 2011-07-30 DIAGNOSIS — I4891 Unspecified atrial fibrillation: Secondary | ICD-10-CM | POA: Insufficient documentation

## 2011-07-30 DIAGNOSIS — J984 Other disorders of lung: Secondary | ICD-10-CM | POA: Insufficient documentation

## 2011-07-30 DIAGNOSIS — I6529 Occlusion and stenosis of unspecified carotid artery: Secondary | ICD-10-CM | POA: Insufficient documentation

## 2011-07-30 DIAGNOSIS — R0609 Other forms of dyspnea: Secondary | ICD-10-CM | POA: Insufficient documentation

## 2011-07-30 DIAGNOSIS — E039 Hypothyroidism, unspecified: Secondary | ICD-10-CM | POA: Insufficient documentation

## 2011-07-30 DIAGNOSIS — I509 Heart failure, unspecified: Secondary | ICD-10-CM | POA: Insufficient documentation

## 2011-07-30 DIAGNOSIS — Z8673 Personal history of transient ischemic attack (TIA), and cerebral infarction without residual deficits: Secondary | ICD-10-CM | POA: Insufficient documentation

## 2011-07-30 DIAGNOSIS — J438 Other emphysema: Secondary | ICD-10-CM | POA: Insufficient documentation

## 2011-07-30 DIAGNOSIS — I739 Peripheral vascular disease, unspecified: Secondary | ICD-10-CM | POA: Insufficient documentation

## 2011-07-30 DIAGNOSIS — R0989 Other specified symptoms and signs involving the circulatory and respiratory systems: Secondary | ICD-10-CM | POA: Insufficient documentation

## 2011-07-30 DIAGNOSIS — I5032 Chronic diastolic (congestive) heart failure: Secondary | ICD-10-CM | POA: Insufficient documentation

## 2011-07-30 DIAGNOSIS — I2510 Atherosclerotic heart disease of native coronary artery without angina pectoris: Secondary | ICD-10-CM | POA: Insufficient documentation

## 2011-08-01 ENCOUNTER — Encounter (HOSPITAL_COMMUNITY)
Admission: RE | Admit: 2011-08-01 | Discharge: 2011-08-01 | Disposition: A | Discharge status: Home / Self Care | Payer: Medicare Other | Source: Ambulatory Visit | Attending: Internal Medicine | Admitting: Internal Medicine

## 2011-08-01 NOTE — Progress Notes (Signed)
[name] 78 y.o. female Pulmonary Rehab Nutrition Screen Ht: 61" Ht Readings from Last 1 Encounters:  06/26/11 5\' 1"  (1.549 m)    Wt:   140.6 lb (63.9 kg) Wt Readings from Last 3 Encounters:  06/26/11 139 lb 12.8 oz (63.413 kg)  06/24/11 141 lb 1.6 oz (64.003 kg)  06/11/11 140 lb 1.6 oz (63.549 kg)    BMI: 26.6  39.4%body fat                       Rate Your Plate Score: 51  Please answer the following questions:             YES  NO    Do you live in a nursing home?  X   Do you eat out more than 3 times per week?    X If yes, how many times per week do you eat out?   Do you have food allergies?   X If yes, what are you allergic to?  Have you gained or lost more than 10 lbs without trying?               X If yes, how much weight have you  lost or gained?  lbs over  weeks/mo   Do you want to lose weight?    X  If yes, what is a goal weight or amount of weight you would like to lose? 20 lbs  Do you eat alone most of the time?   X   Do you eat less than 2 meals/day?  X If yes, how many meals do you eat?  Do you use canned and convenience food? X  1-2 canned vegetables used daily  Do you use a salt shaker?  X   Do you drink more than 3 alcoholic drinks/day?  X If yes, how many drinks per day?  Are you having trouble with constipation? *  X If yes, what are you doing to help relieve constipation?  Do you have financial difficulties with buying food? *  X   Do you usually need help with grocery shopping or with cooking? *  X   Do you have a poor appetite? *                                       X   Do you have trouble chewing/ swallowing? *   X   Do you take vitamin and mineral or herbal supplements? *  X If yes, what kind of supplements do you currently take?    Past Medical History  Diagnosis Date  . TIA (transient ischemic attack)   . Unspecified hypothyroidism   . Unspecified essential hypertension   . Allergic rhinitis, cause unspecified   . Arrhythmia   . Hiatal hernia    . SOB (shortness of breath)     unexplained SOB onset fall 2010. PFT normal 06-12-09. Right heatrt Cath 06-26-09 nl PA but wedge 23 with non obst CAD, echo same day: Left ventricle: the cavity size was normal. Wall thickness was increased in a pattern of mild LVH. Systolic function was vigorous. Estimated ejection fraction 65% to 70%. Left atrium mildly dilated. nml walking sats 11-17-2009  . Carcinoma of uterus   . Carotid artery occlusion     carotid endarterectomy     . Hyperlipidemia    Labs  Lipid Panel  Component Value Date/Time   CHOL 154 06/10/2011 0838   TRIG 131.0 06/10/2011 0838   HDL 69.30 06/10/2011 0838   CHOLHDL 2 06/10/2011 0838   VLDL 26.2 06/10/2011 0838   LDLCALC 59 06/10/2011 0838  06/24/09 HGBA1C 6.4 noted Nutrition Risk Level: Moderate  Nutrition Note Spoke with pt. Pt is overweight.  Pt eats 3 meals a day; most prepared at home.  There are some ways the pt can make her eating habits healthier.  Pt's Rate Your Plate results reviewed with pt.  Pt expressed understanding.  Pt does not avoid salty food; uses canned food daily.  Pt recently stopped adding salt at the table since starting Pulmonary Rehab.  The role of sodium in lung disease reviewed with pt. Pt wants to lose wt. Pt has decreased portion sizes eaten. Wt loss tips discussed. Nutrition Diagnosis   Excessive sodium intake related to over consumption of processed food as evidenced by frequent consumption of canned vegetables.   Food-and nutrition-related knowledge deficit related to lack of exposure to information as related to diagnosis of pulmonary disease   Overweight related to excessive energy intake as evidenced by a BMI of 26.6 Nutrition Rx/Est. Daily Nutrition Needs for: ? wt loss 1200 Kcal  75-95 gm protein   1500 mg or less sodium Nutrition Intervention   Pt's individual nutrition plan and goals reviewed with pt.   Benefits of adopting healthy eating habits discussed when pt's Rate Your Plate  reviewed.   Pt to attend the Nutrition and Lung Disease class   Continual client-centered nutrition education by RD, as part of interdisciplinary care. Goal(s) 1. Pt to identify and limit food sources of sodium. 2. Identify food quantities necessary to achieve wt loss of  -2# per week to a goal wt of 54.5-60 kg (120-132 lb) at graduation from pulmonary rehab. Monitor and Evaluate progress toward nutrition goal with team.

## 2011-08-06 ENCOUNTER — Encounter (HOSPITAL_COMMUNITY)
Admission: RE | Admit: 2011-08-06 | Discharge: 2011-08-06 | Disposition: A | Discharge status: Home / Self Care | Payer: Medicare Other | Source: Ambulatory Visit | Attending: Internal Medicine | Admitting: Internal Medicine

## 2011-08-08 ENCOUNTER — Encounter (HOSPITAL_COMMUNITY)
Admission: RE | Admit: 2011-08-08 | Discharge: 2011-08-08 | Disposition: A | Discharge status: Home / Self Care | Payer: Medicare Other | Source: Ambulatory Visit | Attending: Internal Medicine | Admitting: Internal Medicine

## 2011-08-12 ENCOUNTER — Telehealth: Payer: Self-pay | Admitting: Internal Medicine

## 2011-08-12 NOTE — Telephone Encounter (Signed)
I spoke with daughter and gave her lb ct # to reschedule CT. Nothing further was needed

## 2011-08-13 ENCOUNTER — Encounter (HOSPITAL_COMMUNITY)
Admission: RE | Admit: 2011-08-13 | Discharge: 2011-08-13 | Disposition: A | Discharge status: Home / Self Care | Payer: Medicare Other | Source: Ambulatory Visit | Attending: Internal Medicine | Admitting: Internal Medicine

## 2011-08-13 NOTE — Progress Notes (Signed)
[name] today came to [facility].  After exercising on 2 stations, she felt "weak" and wanted to sit down.  She states she had only cereal and banana and coffee for breakfast.  BP was 124/56 and HR  60.  She was given juice and snack and felt improvement.  She was able to walk around in the exercise area afterward without return of symptoms.  She planned to go home to have lunch immediately after leaving.

## 2011-08-14 ENCOUNTER — Other Ambulatory Visit: Payer: Self-pay | Admitting: Cardiology

## 2011-08-14 ENCOUNTER — Telehealth: Payer: Self-pay | Admitting: Oncology

## 2011-08-14 ENCOUNTER — Other Ambulatory Visit: Payer: Medicare Other

## 2011-08-14 NOTE — Telephone Encounter (Signed)
Pt is requesting a refill on their WARFARIN 5MG. 

## 2011-08-14 NOTE — Telephone Encounter (Signed)
Pt's daughter Francie Massing called wants appts cancelled for 08/20/11 lab and MD, pt transferring care to [facility], called nurse regarding change

## 2011-08-15 ENCOUNTER — Encounter (HOSPITAL_COMMUNITY): Payer: Medicare Other

## 2011-08-15 DIAGNOSIS — C801 Malignant (primary) neoplasm, unspecified: Secondary | ICD-10-CM

## 2011-08-15 DIAGNOSIS — C7982 Secondary malignant neoplasm of genital organs: Secondary | ICD-10-CM | POA: Insufficient documentation

## 2011-08-20 ENCOUNTER — Encounter (HOSPITAL_COMMUNITY)
Admission: RE | Admit: 2011-08-20 | Discharge: 2011-08-20 | Disposition: A | Discharge status: Home / Self Care | Payer: Medicare Other | Source: Ambulatory Visit | Attending: Internal Medicine | Admitting: Internal Medicine

## 2011-08-20 NOTE — Progress Notes (Signed)
[name] came today for exercise in [facility].  She reports she went to [facility] last Thursday for follow up of recurrent CA.  She is to start radiation treatments 5 days per week for 5 weeks.  We discussed if this would be to exhausting to continue in rehab, we can discharge and readmit after treatments are completed.  We will discuss again after she talks with her family.

## 2011-08-22 ENCOUNTER — Encounter (HOSPITAL_COMMUNITY)
Admission: RE | Admit: 2011-08-22 | Discharge: 2011-08-22 | Disposition: A | Discharge status: Home / Self Care | Payer: Medicare Other | Source: Ambulatory Visit | Attending: Internal Medicine | Admitting: Internal Medicine

## 2011-08-22 NOTE — Progress Notes (Signed)
Beginning radiation delayed due to blood test shows some difficulty with kidney function.  Will continue to follow.

## 2011-08-25 ENCOUNTER — Telehealth: Payer: Self-pay | Admitting: Internal Medicine

## 2011-08-25 NOTE — Telephone Encounter (Signed)
reviewd PET scan report 4/18./13 that was sent from [facility]: there is diffuse uptake on thyroid. Please chck if she has hx of thyroid disease ? SHe had mentioned persistent fatigue to me at rehab few weeks ago. Please fax the PET report to Dr Jacky Kindle her PMD when I give it to you; this should be paid attention. I will send him note

## 2011-08-26 NOTE — Telephone Encounter (Signed)
Report faxed and placed in scan folder.Carron Curie, CMA

## 2011-08-27 ENCOUNTER — Encounter (HOSPITAL_COMMUNITY)
Admission: RE | Admit: 2011-08-27 | Discharge: 2011-08-27 | Disposition: A | Discharge status: Home / Self Care | Payer: Medicare Other | Source: Ambulatory Visit | Attending: Internal Medicine | Admitting: Internal Medicine

## 2011-08-27 ENCOUNTER — Ambulatory Visit: Payer: Medicare Other | Admitting: Oncology

## 2011-08-27 ENCOUNTER — Other Ambulatory Visit: Payer: Medicare Other

## 2011-08-29 ENCOUNTER — Encounter (HOSPITAL_COMMUNITY)
Admission: RE | Admit: 2011-08-29 | Discharge: 2011-08-29 | Disposition: A | Discharge status: Home / Self Care | Payer: Medicare Other | Source: Ambulatory Visit | Attending: Internal Medicine | Admitting: Internal Medicine

## 2011-08-29 DIAGNOSIS — I4891 Unspecified atrial fibrillation: Secondary | ICD-10-CM | POA: Insufficient documentation

## 2011-08-29 DIAGNOSIS — I739 Peripheral vascular disease, unspecified: Secondary | ICD-10-CM | POA: Insufficient documentation

## 2011-08-29 DIAGNOSIS — J984 Other disorders of lung: Secondary | ICD-10-CM | POA: Insufficient documentation

## 2011-08-29 DIAGNOSIS — R0989 Other specified symptoms and signs involving the circulatory and respiratory systems: Secondary | ICD-10-CM | POA: Insufficient documentation

## 2011-08-29 DIAGNOSIS — I6529 Occlusion and stenosis of unspecified carotid artery: Secondary | ICD-10-CM | POA: Insufficient documentation

## 2011-08-29 DIAGNOSIS — J438 Other emphysema: Secondary | ICD-10-CM | POA: Insufficient documentation

## 2011-08-29 DIAGNOSIS — I5032 Chronic diastolic (congestive) heart failure: Secondary | ICD-10-CM | POA: Insufficient documentation

## 2011-08-29 DIAGNOSIS — I509 Heart failure, unspecified: Secondary | ICD-10-CM | POA: Insufficient documentation

## 2011-08-29 DIAGNOSIS — I2510 Atherosclerotic heart disease of native coronary artery without angina pectoris: Secondary | ICD-10-CM | POA: Insufficient documentation

## 2011-08-29 DIAGNOSIS — Z8673 Personal history of transient ischemic attack (TIA), and cerebral infarction without residual deficits: Secondary | ICD-10-CM | POA: Insufficient documentation

## 2011-08-29 DIAGNOSIS — E039 Hypothyroidism, unspecified: Secondary | ICD-10-CM | POA: Insufficient documentation

## 2011-08-29 DIAGNOSIS — I10 Essential (primary) hypertension: Secondary | ICD-10-CM | POA: Insufficient documentation

## 2011-08-29 DIAGNOSIS — Z5189 Encounter for other specified aftercare: Secondary | ICD-10-CM | POA: Insufficient documentation

## 2011-08-29 DIAGNOSIS — R0609 Other forms of dyspnea: Secondary | ICD-10-CM | POA: Insufficient documentation

## 2011-09-03 ENCOUNTER — Encounter (HOSPITAL_COMMUNITY): Payer: Medicare Other

## 2011-09-05 ENCOUNTER — Encounter (HOSPITAL_COMMUNITY): Payer: Medicare Other

## 2011-09-10 ENCOUNTER — Other Ambulatory Visit: Payer: Self-pay | Admitting: Cardiology

## 2011-09-10 ENCOUNTER — Encounter (HOSPITAL_COMMUNITY): Payer: Medicare Other

## 2011-09-10 DIAGNOSIS — I4891 Unspecified atrial fibrillation: Secondary | ICD-10-CM

## 2011-09-10 DIAGNOSIS — R0602 Shortness of breath: Secondary | ICD-10-CM

## 2011-09-10 MED ORDER — POTASSIUM CHLORIDE CRYS ER 20 MEQ PO TBCR: 20.0000 meq | EXTENDED_RELEASE_TABLET | Freq: Two times a day (BID) | ORAL | Status: DC

## 2011-09-10 NOTE — Telephone Encounter (Signed)
Refill - Potassium chloride SA (K-DUR,KLOR-CON) 20 MEQ tablet    Please return call to patient daughter Sheralyn Boatman [phone] regarding medication dosage, before this RX is refill.  Verified preferred is [facility] [place] [address] [facility].

## 2011-09-12 ENCOUNTER — Telehealth: Payer: Self-pay | Admitting: Internal Medicine

## 2011-09-12 ENCOUNTER — Encounter (HOSPITAL_COMMUNITY): Payer: Medicare Other

## 2011-09-12 NOTE — Telephone Encounter (Signed)
Spoke with Toni-aware that we have faxed the request to [facility] to get CT results STAT on patient for MR to have for appt on 09-13-2011. Sheralyn Boatman aware to still keep appt and MR can discuss with patient then.

## 2011-09-12 NOTE — Telephone Encounter (Signed)
Daughter Sheralyn Boatman called back again. Says she called a few3 minutes ago and was told that MR's nurse needs to call [facility] and request CT asap otherwise daughter says there's no point in pt coming in tomorrow. Call [phone]. Hazel Sams

## 2011-09-13 ENCOUNTER — Ambulatory Visit (INDEPENDENT_AMBULATORY_CARE_PROVIDER_SITE_OTHER): Payer: Medicare Other | Admitting: Internal Medicine

## 2011-09-13 ENCOUNTER — Encounter: Payer: Self-pay | Admitting: Internal Medicine

## 2011-09-13 VITALS — BP 120/56 | HR 62 | Temp 96.8°F | Ht 61.0 in | Wt 141.4 lb

## 2011-09-13 DIAGNOSIS — J984 Other disorders of lung: Secondary | ICD-10-CM

## 2011-09-13 DIAGNOSIS — R0602 Shortness of breath: Secondary | ICD-10-CM

## 2011-09-13 NOTE — Patient Instructions (Signed)
#  Shortness of breath/Fatigue  - Continue oxygen  - stop qvar  - see if tudorza samples (3 months) twice daily helps you  - when recovered from radiation treatment, call rehab and rejoin that  #Pulmonary nodule  If you remember this was noted April 2012 and is stable per report on [facility] CT chest I will now follow this based on scans done at [facility] instead of doing CT here  #Thyrooid uptake on PET  - please asked the [facility] doctors what the diffuse uptake on thyroid on PET scan April 2013 means  #FOllowup  -3-4   Months or sooner if needed

## 2011-09-13 NOTE — Progress Notes (Signed)
Subjective:    Patient ID: [name], female    DOB: 08-01-1923, 78 y.o.   MRN: [id number]  HPI HPI OV 11/23/10: Followup dysponea. Since last visit, abdominal wound healed. She reports improved exercise tolerance and less dysponea. Now able to walk 2 blocks without dyspnea.  Dyspnea still relieved by rest. Also brought on by stooping over. Asociated improved ADLs at home plus. Reports sympotoms as mild-moderate. No associated cough, chest pain. Family noticed some associated edema (mild) in hands and feet past few days. Independent review CPST (done on QVAR for emphysema) 11/20/2010 suggests diasgtolic dysfunction - the O2 pulse is flat. She gave good effort and overall VO2 max and AT are normal without resp limitation or desaturation (? QVAR effect on emphysema). PMhx review shows hx psotiive for diastolic chf on cath feb 2011  #Shortness of breath  CPSt bike test 11/20/2010 suggests DIASTOLIC DYSFUNCTION of heart as cause of shortness of breath  - this is when the heart muscle has problem relaxing when you exert yourself  Recommend you follow with Dr. Shirlee Latch for this  Recommend also you go through pulmonary rehab exercise program  Return to see me in 4-5 months to review how things are going  Come sooner if there are problems   #Pulmonary nodule  If you remember this was noted April 2012  You need repeat CT chest without contrast April 2013. My nurse will ensure there is an order  OV 04/05/2011 Followup dyspnea. Since last visit overall stable. Dyspnea somewhat improved. She is finishingup her GYN RX. Still some dyspnea with household activities. She is satisfied with her current quality of life but dtr wants her better and able to do more. Patient not so motivated. Dtr wondering about giving her nocturnal o2. Patient not sure about wanting to attend rehab; dtr quite keen she attend.   r#Shortness of breath Recommend also you go through pulmonary rehab exercise program; thanks for  agreeing Return to see me in 4-5 months to review how things are going Come sooner if there are problems #Pulmonary nodule  If you remember this was noted April 2012 You need repeat CT chest without contrast April 2013. My nurse will ensure there is an order #Followup  - April 2013 after CT chest  OV 09/13/2011  Started rehab after last OV and fatigue dyspnea improved but had cancer reucrence and so rehab sheleved. Has finished 10 out of 28 exernal XRT and has int XRT pending. Very fatigued and dyspneic again; feels this is due to XRT. Copliant wiht o2 and qvar. Not sure qvar is helping. In interim she did have ONO 04/12/11 - some 21 minutes spent pulse ox <88% but never below 83%. So she can continue to use nocturnal o2.   In terms of nodule,  [facility] APril 2013 PET and CT report shows stable RLL nodule since jan 2012 but also diffuse uptake thyroid on PET scan. Note: emphysema also seen on scan    Past, Family, Social reviewed: no change since last visit other than fact, cancer has recurred and she is undergoing XRT    Review of Systems  Constitutional: Negative for fever and unexpected weight change.  HENT: Positive for rhinorrhea, sneezing and postnasal drip. Negative for ear pain, nosebleeds, congestion, sore throat, trouble swallowing, dental problem and sinus pressure.   Eyes: Negative for redness and itching.  Respiratory: Positive for cough and shortness of breath. Negative for choking, chest tightness and wheezing.   Cardiovascular: Negative for palpitations and leg  swelling.  Gastrointestinal: Negative for nausea and vomiting.  Genitourinary: Negative for dysuria.  Musculoskeletal: Negative for joint swelling.  Skin: Negative for rash.  Neurological: Negative for headaches.  Hematological: Does not bruise/bleed easily.  Psychiatric/Behavioral: Negative for dysphoric mood. The patient is not nervous/anxious.        Objective:   Physical Exam Vitals  reviewed. Constitutional: She is oriented to person, place, and time. She appears well-developed and well-nourished. No distress.  HENT:  Head: Normocephalic and atraumatic.  Right Ear: External ear normal.  Left Ear: External ear normal.  Mouth/Throat: Oropharynx is clear and moist. No oropharyngeal exudate.  Eyes: Conjunctivae and EOM are normal. Pupils are equal, round, and reactive to light. Right eye exhibits no discharge. Left eye exhibits no discharge. No scleral icterus.  Neck: Normal range of motion. Neck supple. No JVD present. No tracheal deviation present. No thyromegaly present.  Cardiovascular: Normal rate, regular rhythm, normal heart sounds and intact distal pulses.  Exam reveals no gallop and no friction rub.   No murmur heard. Pulmonary/Chest: Effort normal and breath sounds normal. No respiratory distress. She has no wheezes. She has no rales. She exhibits no tenderness.  Abdominal: Soft. Bowel sounds are normal. She exhibits no distension and no mass. There is no tenderness. There is no rebound and no guarding. HOWEVER, HAS OPEN WOUND IN LOWER PART HEALING BY SECONDARY INTENTION Musculoskeletal: Normal range of motion. She exhibits no edema and no tenderness.  Lymphadenopathy:    She has no cervical adenopathy.  Neurological: She is alert and oriented to person, place, and time. She has normal reflexes. No cranial nerve deficit. She exhibits normal muscle tone. Coordination normal.    Skin: Skin is warm and dry. No rash noted. She is not diaphoretic. No erythema. No pallor.  Psychiatric: She has a normal mood and affect. Her behavior is normal. Judgment and thought content normal.           Assessment & Plan:

## 2011-09-14 ENCOUNTER — Encounter: Payer: Self-pay | Admitting: Internal Medicine

## 2011-09-14 NOTE — Assessment & Plan Note (Signed)
#  Shortness of breath/Fatigue - this is now mostly due to XRT. She had positive response to rehab but interrupted due to cancer Rx.  I have advised her to   - Continue oxygen  - stop qvar  - see if spiriva samples (3 months) twice daily helps you  - when recovered from radiation treatment, she will call rehab and rejoin that

## 2011-09-14 NOTE — Assessment & Plan Note (Signed)
#  Pulmonary nodule  If you remember this was [facility] April 2012 [facility] but per reports of [facility] this is stable Jan 2012 through APril 2013. I will leave primary fu of this to [facility] but will just follow reports; probably neesds another scan in 1 year i  #Thyrooid uptake on PET  - please asked the [facility] doctors what the diffuse uptake on thyroid on PET scan April 2013 means

## 2011-09-17 ENCOUNTER — Encounter (HOSPITAL_COMMUNITY): Payer: Medicare Other

## 2011-09-19 ENCOUNTER — Encounter (HOSPITAL_COMMUNITY): Payer: Medicare Other

## 2011-09-24 ENCOUNTER — Encounter (HOSPITAL_COMMUNITY): Payer: Medicare Other

## 2011-09-26 ENCOUNTER — Encounter (HOSPITAL_COMMUNITY): Payer: Medicare Other

## 2011-10-01 ENCOUNTER — Encounter (HOSPITAL_COMMUNITY): Payer: Medicare Other

## 2011-10-02 ENCOUNTER — Other Ambulatory Visit: Payer: Medicare Other

## 2011-10-02 ENCOUNTER — Ambulatory Visit: Payer: Self-pay | Admitting: Neurosurgery

## 2011-10-03 ENCOUNTER — Encounter (HOSPITAL_COMMUNITY): Payer: Medicare Other

## 2011-10-08 ENCOUNTER — Encounter (HOSPITAL_COMMUNITY)
Admission: RE | Admit: 2011-10-08 | Discharge: 2011-10-08 | Disposition: A | Discharge status: Home / Self Care | Payer: Medicare Other | Source: Ambulatory Visit | Attending: Internal Medicine | Admitting: Internal Medicine

## 2011-10-08 DIAGNOSIS — I509 Heart failure, unspecified: Secondary | ICD-10-CM | POA: Insufficient documentation

## 2011-10-08 DIAGNOSIS — E039 Hypothyroidism, unspecified: Secondary | ICD-10-CM | POA: Insufficient documentation

## 2011-10-08 DIAGNOSIS — I739 Peripheral vascular disease, unspecified: Secondary | ICD-10-CM | POA: Insufficient documentation

## 2011-10-08 DIAGNOSIS — I4891 Unspecified atrial fibrillation: Secondary | ICD-10-CM | POA: Insufficient documentation

## 2011-10-08 DIAGNOSIS — Z5189 Encounter for other specified aftercare: Secondary | ICD-10-CM | POA: Insufficient documentation

## 2011-10-08 DIAGNOSIS — R0609 Other forms of dyspnea: Secondary | ICD-10-CM | POA: Insufficient documentation

## 2011-10-08 DIAGNOSIS — J984 Other disorders of lung: Secondary | ICD-10-CM | POA: Insufficient documentation

## 2011-10-08 DIAGNOSIS — I6529 Occlusion and stenosis of unspecified carotid artery: Secondary | ICD-10-CM | POA: Insufficient documentation

## 2011-10-08 DIAGNOSIS — J438 Other emphysema: Secondary | ICD-10-CM | POA: Insufficient documentation

## 2011-10-08 DIAGNOSIS — I2510 Atherosclerotic heart disease of native coronary artery without angina pectoris: Secondary | ICD-10-CM | POA: Insufficient documentation

## 2011-10-08 DIAGNOSIS — I5032 Chronic diastolic (congestive) heart failure: Secondary | ICD-10-CM | POA: Insufficient documentation

## 2011-10-08 DIAGNOSIS — I10 Essential (primary) hypertension: Secondary | ICD-10-CM | POA: Insufficient documentation

## 2011-10-08 DIAGNOSIS — Z8673 Personal history of transient ischemic attack (TIA), and cerebral infarction without residual deficits: Secondary | ICD-10-CM | POA: Insufficient documentation

## 2011-10-08 DIAGNOSIS — R0989 Other specified symptoms and signs involving the circulatory and respiratory systems: Secondary | ICD-10-CM | POA: Insufficient documentation

## 2011-10-09 NOTE — Progress Notes (Signed)
PULMONARY REHAB OUTCOMES REPORT  Patient has been discharged due to other health related concerns. Patient was able to complete 12 sessions out of 24. Patient tolerated well with all exercise equipment and vitals were stable throughout the rehab program. Very compliant with attendance and had great work ethic.  Patient may return when ready with a new MD order.  Consuella Scurlock L. Manson Passey, MS, NASM, CES

## 2011-10-10 ENCOUNTER — Encounter (HOSPITAL_COMMUNITY): Payer: Medicare Other

## 2011-10-15 ENCOUNTER — Encounter (HOSPITAL_COMMUNITY): Payer: Medicare Other

## 2011-10-17 ENCOUNTER — Encounter (HOSPITAL_COMMUNITY): Payer: Medicare Other

## 2011-10-22 ENCOUNTER — Encounter (HOSPITAL_COMMUNITY): Payer: Medicare Other

## 2011-10-24 ENCOUNTER — Encounter (HOSPITAL_COMMUNITY): Payer: Medicare Other

## 2011-10-28 NOTE — Progress Notes (Signed)
Pulmonary Rehab RD Outcomes Note There is some room for improvement for pt diet to become healthier.  Pt wt up 1.2 kg.   Section Completed by: Mickle Plumb, M.Ed, RD, LDN, CDE

## 2011-10-29 ENCOUNTER — Encounter (HOSPITAL_COMMUNITY): Admission: RE | Admit: 2011-10-29 | Payer: Medicare Other | Source: Ambulatory Visit

## 2011-11-03 ENCOUNTER — Other Ambulatory Visit: Payer: Self-pay | Admitting: Cardiology

## 2011-11-18 ENCOUNTER — Telehealth: Payer: Self-pay | Admitting: Internal Medicine

## 2011-11-18 MED ORDER — ACLIDINIUM BROMIDE 400 MCG/ACT IN AEPB: 1.0000 | INHALATION_SPRAY | Freq: Two times a day (BID) | RESPIRATORY_TRACT | Status: DC

## 2011-11-18 NOTE — Telephone Encounter (Signed)
I spoke with [place] and she states the [place] has helped with pt breathing and has requested an rx be called in for pt. I have sent rx and will forward to MR as an [place]

## 2011-11-18 NOTE — Telephone Encounter (Signed)
lmomtcb x1 for tony 

## 2011-11-18 NOTE — Telephone Encounter (Signed)
Pt's daughter, Sheralyn Boatman, returned triage's call & can be reached at [phone].  [name]

## 2011-11-22 NOTE — Telephone Encounter (Signed)
Ok thanks 

## 2011-11-25 ENCOUNTER — Telehealth: Payer: Self-pay | Admitting: Internal Medicine

## 2011-11-25 NOTE — Telephone Encounter (Signed)
Member ID # [id number]. Tudorza APPROVED from 08/26/2011 through 04/28/2012. Pharmacy has been notified.

## 2011-12-16 ENCOUNTER — Encounter: Payer: Self-pay | Admitting: Neurosurgery

## 2011-12-17 ENCOUNTER — Ambulatory Visit (INDEPENDENT_AMBULATORY_CARE_PROVIDER_SITE_OTHER): Payer: Medicare Other | Admitting: Neurosurgery

## 2011-12-17 ENCOUNTER — Ambulatory Visit (INDEPENDENT_AMBULATORY_CARE_PROVIDER_SITE_OTHER): Payer: Medicare Other | Admitting: Vascular Surgery

## 2011-12-17 ENCOUNTER — Encounter: Payer: Self-pay | Admitting: Neurosurgery

## 2011-12-17 VITALS — BP 129/75 | HR 62 | Resp 16 | Ht 61.0 in | Wt 138.7 lb

## 2011-12-17 DIAGNOSIS — Z48812 Encounter for surgical aftercare following surgery on the circulatory system: Secondary | ICD-10-CM

## 2011-12-17 DIAGNOSIS — I6529 Occlusion and stenosis of unspecified carotid artery: Secondary | ICD-10-CM

## 2011-12-17 NOTE — Progress Notes (Signed)
[facility] Carotid Office Note  CC: Annual carotid surveillance Referring Physician: Hart Rochester  History of Present Illness: 78 year old female patient of Dr. Hart Rochester followed status post right carotid endarterectomy in 2011. The patient seen today with her daughter, the patient and her daughter deny any signs or symptoms of CVA, TIA, amaurosis fugax or any neural deficit. The patient denies any new medical diagnoses or recent surgery.  Past Medical History  Diagnosis Date  . TIA (transient ischemic attack)   . Unspecified hypothyroidism   . Unspecified essential hypertension   . Allergic rhinitis, cause unspecified   . Arrhythmia   . Hiatal hernia   . SOB (shortness of breath)     unexplained SOB onset fall 2010. PFT normal 06-12-09. Right heatrt Cath 06-26-09 nl PA but wedge 23 with non obst CAD, echo same day: Left ventricle: the cavity size was normal. Wall thickness was increased in a pattern of mild LVH. Systolic function was vigorous. Estimated ejection fraction 65% to 70%. Left atrium mildly dilated. nml walking sats 11-17-2009  . Carotid artery occlusion     carotid endarterectomy     . Hyperlipidemia   . Carcinoma of uterus     ROS: [x]  Positive   [ ]  Denies    General: [ ]  Weight loss, [ ]  Fever, [ ]  chills Neurologic: [ ]  Dizziness, [ ]  Blackouts, [ ]  Seizure [ ]  Stroke, [ ]  "Mini stroke", [ ]  Slurred speech, [ ]  Temporary blindness; [ ]  weakness in arms or legs, [ ]  Hoarseness Cardiac: [ ]  Chest pain/pressure, [ ]  Shortness of breath at rest [ ]  Shortness of breath with exertion, [ ]  Atrial fibrillation or irregular heartbeat Vascular: [ ]  Pain in legs with walking, [ ]  Pain in legs at rest, [ ]  Pain in legs at night,  [ ]  Non-healing ulcer, [ ]  Blood clot in vein/DVT,   Pulmonary: [ ]  Home oxygen, [ ]  Productive cough, [ ]  Coughing up blood, [ ]  Asthma,  [ ]  Wheezing Musculoskeletal:  [ ]  Arthritis, [ ]  Low back pain, [ ]  Joint pain Hematologic:  [ ]  Easy Bruising, [ ]  Anemia; [ ]  Hepatitis Gastrointestinal: [ ]  Blood in stool, [ ]  Gastroesophageal Reflux/heartburn, [ ]  Trouble swallowing Urinary: [ ]  chronic Kidney disease, [ ]  on HD - [ ]  MWF or [ ]  TTHS, [ ]  Burning with urination, [ ]  Difficulty urinating Skin: [ ]  Rashes, [ ]  Wounds Psychological: [ ]  Anxiety, [ ]  Depression   Social History History  Substance Use Topics  . Smoking status: Passive Smoker  . Smokeless tobacco: Never Used  . Alcohol Use: No    Family History Family History  Problem Relation Age of Onset  . Heart disease Mother   . Hypertension Mother   . Stroke Mother   . Heart disease Sister 49    Heart Disease before age 13  . Cancer Sister   . COPD Sister   . Heart disease Brother   . Cancer Brother   . Coronary artery disease Other   . Cancer Daughter     Breast cancer  . Cancer Son     prostate    Allergies  Allergen Reactions  . Albuterol     devloped tremors, swelling during PFT following albuterol    Current Outpatient Prescriptions  Medication Sig Dispense Refill  . Aclidinium Bromide (TUDORZA PRESSAIR) 400 MCG/ACT AEPB Inhale 1 puff into the lungs 2 (two) times daily.  60 each  5  . bisoprolol (ZEBETA) 5 MG tablet Take 5 mg by mouth daily.        . Calcium Carbonate (CALTRATE 600 PO) Take by mouth daily.        Marland Kitchen diltiazem (CARDIZEM CD) 240 MG 24 hr capsule TAKE ONE CAPSULE BY MOUTH EVERY DAY  90 capsule  3  . furosemide (LASIX) 40 MG tablet Take 20 mg by mouth 2 (two) times daily.      . iron polysaccharides (NIFEREX) 150 MG capsule Take 150 mg by mouth 2 (two) times daily.        Marland Kitchen KLOR-CON M20 20 MEQ tablet TAKE 2 TABLETS ONE DAY AND 1 TABLET THE NEXT DAY, ALTERNATING DAYS  50 tablet  6  . levothyroxine (SYNTHROID, LEVOTHROID) 50 MCG tablet Take 50 mcg by mouth daily.        . Melatonin 3-10 MG TABS Take 1 tablet by mouth every evening.        . nitroGLYCERIN (NITROSTAT) 0.4 MG SL tablet Place 0.4 mg under the tongue every 5  (five) minutes as needed.        . potassium chloride SA (K-DUR,KLOR-CON) 20 MEQ tablet Take 1 tablet (20 mEq total) by mouth 2 (two) times daily.  60 tablet  6  . simvastatin (ZOCOR) 40 MG tablet TAKE 1 TABLET BY MOUTH EVERY EVENING  30 tablet  6  . warfarin (COUMADIN) 5 MG tablet Take 5 mg by mouth daily. As directed per Coumadin clinic       . pantoprazole (PROTONIX) 40 MG tablet Take 40 mg by mouth Daily.      Marland Kitchen QVAR 80 MCG/ACT inhaler INHALE 2 PUFFS INTO THE LUNGS 2 TIMES DAILY.  1 Inhaler  6    Physical Examination  Filed Vitals:   12/17/11 1509  BP: 129/75  Pulse: 62  Resp:     Body mass index is 26.21 kg/(m^2).  General:  WDWN in NAD Gait: Normal HEENT: WNL Eyes: Pupils equal Pulmonary: normal non-labored breathing , without Rales, rhonchi,  wheezing Cardiac: RRR, without  Murmurs, rubs or gallops; Abdomen: soft, NT, no masses Skin: no rashes, ulcers noted  Vascular Exam Pulses: 3+ radial pulses bilaterally Carotid bruits: Carotid pulses to auscultation no bruits are heard Extremities without ischemic changes, no Gangrene , no cellulitis; no open wounds;  Musculoskeletal: no muscle wasting or atrophy   Neurologic: A&O X 3; Appropriate Affect ; SENSATION: normal; MOTOR FUNCTION:  moving all extremities equally. Speech is fluent/normal  Non-Invasive Vascular Imaging CAROTID DUPLEX 12/17/2011  Right ICA 0 - 19% stenosis Left ICA 20 - 39 % stenosis   ASSESSMENT/PLAN: Asymptomatic patient status post right carotid endarterectomy in 2011. The patient will followup in one year with repeat carotid duplex. The patient's questions were encouraged and answered and she is in agreement with this plan. The patient and her daughter are aware of the signs and symptoms of CVA and knows to report to the nearest emergency department should that occur.  Lauree Chandler ANP   Clinic MD: Hart Rochester

## 2011-12-17 NOTE — Addendum Note (Signed)
Addended by: Sharee Pimple on: 12/17/2011 03:39 PM   Modules accepted: Orders

## 2011-12-18 ENCOUNTER — Encounter: Payer: Self-pay | Admitting: Neurosurgery

## 2012-01-06 ENCOUNTER — Ambulatory Visit (INDEPENDENT_AMBULATORY_CARE_PROVIDER_SITE_OTHER): Payer: Medicare Other | Admitting: Internal Medicine

## 2012-01-06 ENCOUNTER — Encounter: Payer: Self-pay | Admitting: Internal Medicine

## 2012-01-06 VITALS — BP 110/62 | HR 61 | Temp 98.7°F | Ht 61.0 in | Wt 139.4 lb

## 2012-01-06 DIAGNOSIS — R0602 Shortness of breath: Secondary | ICD-10-CM

## 2012-01-06 NOTE — Patient Instructions (Addendum)
#  Shortness of breath/Fatigue/Emphysema - congrats on flu shot  - Continue oxygen  -continue  tudorza  - I have discussed and referred you to pulmonary rehab  #Pulmonary nodule  If you remember this was noted April 2012 and is stable compared to Jan 2-12 per report on [facility] CT chest  I will now follow this based on scans done at [facility] instead of doing CT here   #FOllowup  -6 months

## 2012-01-06 NOTE — Progress Notes (Signed)
Subjective:    Patient ID: [name], female    DOB: May 20, 1923, 78 y.o.   MRN: [id number]  HPI  HPI OV 11/23/10: Followup dysponea. Since last visit, abdominal wound healed. She reports improved exercise tolerance and less dysponea. Now able to walk 2 blocks without dyspnea.  Dyspnea still relieved by rest. Also brought on by stooping over. Asociated improved ADLs at home plus. Reports sympotoms as mild-moderate. No associated cough, chest pain. Family noticed some associated edema (mild) in hands and feet past few days. Independent review CPST (done on QVAR, emphysema) 11/20/2010 suggests diasgtolic dysfunction - the O2 pulse is flat. She gave good effort and overall VO2 max and AT are normal without resp limitation or desaturation. PMhx review shows hx psotiive for diastolic chf on cath feb 2011  #Shortness of breath  CPSt bike test 11/20/2010 suggests DIASTOLIC DYSFUNCTION of heart as cause of shortness of breath  - this is when the heart muscle has problem relaxing when you exert yourself  Recommend you follow with Dr. Shirlee Latch for this  Recommend also you go through pulmonary rehab exercise program  Return to see me in 4-5 months to review how things are going  Come sooner if there are problems   #Pulmonary nodule  If you remember this was noted April 2012  You need repeat CT chest without contrast April 2013. My nurse will ensure there is an order  OV 04/05/2011 Followup dyspnea. Since last visit overall stable. Dyspnea somewhat improved. She is finishingup her GYN RX. Still some dyspnea with household activities. She is satisfied with her current quality of life but dtr wants her better and able to do more. Patient not so motivated. Dtr wondering about giving her nocturnal o2. Patient not sure about wanting to attend rehab; dtr quite keen she attend.   r#Shortness of breath Recommend also you go through pulmonary rehab exercise program; thanks for agreeing Return to see me in 4-5  months to review how things are going Come sooner if there are problems #Pulmonary nodule  If you remember this was noted April 2012 You need repeat CT chest without contrast April 2013. My nurse will ensure there is an order #Followup  - April 2013 after CT chest  OV 09/13/2011  Started rehab after last OV and fatigue dyspnea improved but had cancer reucrence and so rehab sheleved. Has finished 10 out of 28 exernal XRT and has int XRT pending. Very fatigued and dyspneic again; feels this is due to XRT. Copliant wiht o2 and qvar. Not sure qvar is helping. In interim she did have ONO 04/12/11 - some 21 minutes spent pulse ox <88% but never below 83%. So she can continue to use nocturnal o2.   In terms of nodule,  [facility] APril 2013 PET and CT report shows stable RLL nodule since jan 2012 but also diffuse uptake thyroid on PET scan. Note: emphysema also seen on scan    Past, Family, Social reviewed: no change since last visit other than fact, cancer has recurred and she is undergoing XRT  #Shortness of breath/Fatigue  - Continue oxygen  - stop qvar  - see if tudorza samples (3 months) twice daily helps you  - when recovered from radiation treatment, call rehab and rejoin that  #Pulmonary nodule  If you remember this was noted April 2012 and is stable per report on [facility] CT chest I will now follow this based on scans done at [facility] instead of doing CT here  #Thyrooid uptake  on PET  - please asked the [facility] doctors what the diffuse uptake on thyroid on PET scan April 2013 means  #FOllowup  -3-4   Months or sooner if needed   OV 01/06/2012  Tudorza - helping. Using O2. Has had flu shot. No new problems. Struggling to agree to rehab.   XRT ended October 24, 2011 (26 external courses + 5 more internal) and so fatigue improving  Above now has reuslted in dyspnea improving. She is not compelled now to attend rehab. She initially said it helped her but dtr feels motivation is an issue. Patient  also feels she does not want to burden daughter. After much discussion she has agreed  Has had flu shot.    Past, Family, Social reviewed: no change since last visit. Done with XRT   Review of Systems  Constitutional: Negative.  Negative for fever, chills, diaphoresis, activity change, appetite change, fatigue and unexpected weight change.  HENT: Positive for congestion, rhinorrhea and sneezing. Negative for hearing loss, ear pain, nosebleeds, sore throat, facial swelling, mouth sores, trouble swallowing, neck pain, neck stiffness, dental problem, voice change, postnasal drip, sinus pressure, tinnitus and ear discharge.   Eyes: Negative.  Negative for photophobia, discharge, itching and visual disturbance.       Pt states eyes water a lot  Respiratory: Positive for cough and shortness of breath. Negative for apnea, choking, chest tightness, wheezing and stridor.   Cardiovascular: Negative.  Negative for chest pain, palpitations and leg swelling.  Gastrointestinal: Negative.  Negative for nausea, vomiting, abdominal pain, constipation, blood in stool and abdominal distention.  Genitourinary: Negative.  Negative for dysuria, urgency, frequency, hematuria, flank pain, decreased urine volume and difficulty urinating.  Musculoskeletal: Negative.  Negative for myalgias, back pain, joint swelling, arthralgias and gait problem.  Skin: Negative.  Negative for color change, pallor and rash.  Neurological: Negative.  Negative for dizziness, tremors, seizures, syncope, speech difficulty, weakness, light-headedness, numbness and headaches.  Hematological: Negative.  Negative for adenopathy. Does not bruise/bleed easily.  Psychiatric/Behavioral: Positive for disturbed wake/sleep cycle. Negative for confusion and agitation. The patient is not nervous/anxious.        Objective:   Physical Exam   Vitals reviewed. Constitutional: She is oriented to person, place, and time. She appears well-developed and  well-nourished. No distress.  HENT:  Head: Normocephalic and atraumatic.  Right Ear: External ear normal.  Left Ear: External ear normal.  Mouth/Throat: Oropharynx is clear and moist. No oropharyngeal exudate.  Eyes: Conjunctivae and EOM are normal. Pupils are equal, round, and reactive to light. Right eye exhibits no discharge. Left eye exhibits no discharge. No scleral icterus.  Neck: Normal range of motion. Neck supple. No JVD present. No tracheal deviation present. No thyromegaly present.  Cardiovascular: Normal rate, regular rhythm, normal heart sounds and intact distal pulses.  Exam reveals no gallop and no friction rub.   No murmur heard. Pulmonary/Chest: Effort normal and breath sounds normal. No respiratory distress. She has no wheezes. She has no rales. She exhibits no tenderness.  Abdominal: Soft. Bowel sounds are normal. She exhibits no distension and no mass. There is no tenderness. There is no rebound and no guarding.  Musculoskeletal: Normal range of motion. She exhibits no edema and no tenderness.  Lymphadenopathy:    She has no cervical adenopathy.  Neurological: She is alert and oriented to person, place, and time. She has normal reflexes. No cranial nerve deficit. She exhibits normal muscle tone. Coordination normal.    Skin: Skin is  warm and dry. No rash noted. She is not diaphoretic. No erythema. No pallor.  Psychiatric: She has a normal mood and affect. Her behavior is normal. Judgment and thought content normal.           Assessment & Plan:

## 2012-01-07 ENCOUNTER — Encounter: Payer: Self-pay | Admitting: Internal Medicine

## 2012-01-07 NOTE — Assessment & Plan Note (Signed)
#  Shortness of breath/Fatigue/Emphysema - congrats on flu shot  - Continue oxygen  -continue  tudorza  - I have discussed and referred you to pulmonary rehab  > 50% of this > 15 min visit spent in face to face counseling

## 2012-01-15 ENCOUNTER — Other Ambulatory Visit: Payer: Self-pay | Admitting: Cardiovascular Disease

## 2012-02-01 ENCOUNTER — Other Ambulatory Visit: Payer: Self-pay | Admitting: Cardiology

## 2012-02-03 ENCOUNTER — Telehealth: Payer: Self-pay | Admitting: *Deleted

## 2012-02-03 MED ORDER — FUROSEMIDE 40 MG PO TABS: 20.0000 mg | ORAL_TABLET | Freq: Two times a day (BID) | ORAL | Status: DC

## 2012-02-03 NOTE — Telephone Encounter (Signed)
LMOM to inform pt that she is overdue for 6 month f/u she has not been seen since 05-27-2011 needs to schedule appointment. Refilled Furosemide until able to be seen.

## 2012-02-09 ENCOUNTER — Other Ambulatory Visit: Payer: Self-pay | Admitting: Cardiology

## 2012-02-26 ENCOUNTER — Telehealth: Payer: Self-pay | Admitting: Cardiology

## 2012-02-26 NOTE — Telephone Encounter (Signed)
[name] calling for grandparents - having trouble affording medications.  They are looking into a supplement plan for help with the cost of prescriptions.  Instructed her to bring the information related to medication coverage and tiers from the insurance company they decide to use to the next office visit so that a decision can be made based on what the insurance company covers. Also instructed her to consider changing pharmacies that carry generic on a $4/$10 plan.  She states understanding.

## 2012-02-26 NOTE — Telephone Encounter (Signed)
New Problem:    Patient's daughter called in because her mother's potassium chloride SA (K-DUR,KLOR-CON) 20 MEQ tablet is too expensive and would like to know if there was a cheaper alternative.  Please call back.

## 2012-03-03 ENCOUNTER — Ambulatory Visit (INDEPENDENT_AMBULATORY_CARE_PROVIDER_SITE_OTHER): Payer: Medicare Other | Admitting: Cardiology

## 2012-03-03 ENCOUNTER — Encounter: Payer: Self-pay | Admitting: Cardiology

## 2012-03-03 VITALS — BP 114/60 | HR 62 | Ht 62.0 in | Wt 141.0 lb

## 2012-03-03 DIAGNOSIS — R0609 Other forms of dyspnea: Secondary | ICD-10-CM

## 2012-03-03 DIAGNOSIS — I10 Essential (primary) hypertension: Secondary | ICD-10-CM

## 2012-03-03 DIAGNOSIS — I4891 Unspecified atrial fibrillation: Secondary | ICD-10-CM

## 2012-03-03 DIAGNOSIS — I5032 Chronic diastolic (congestive) heart failure: Secondary | ICD-10-CM

## 2012-03-03 DIAGNOSIS — R0989 Other specified symptoms and signs involving the circulatory and respiratory systems: Secondary | ICD-10-CM

## 2012-03-03 DIAGNOSIS — R0602 Shortness of breath: Secondary | ICD-10-CM

## 2012-03-03 DIAGNOSIS — I2510 Atherosclerotic heart disease of native coronary artery without angina pectoris: Secondary | ICD-10-CM

## 2012-03-03 DIAGNOSIS — I6529 Occlusion and stenosis of unspecified carotid artery: Secondary | ICD-10-CM

## 2012-03-03 MED ORDER — FUROSEMIDE 40 MG PO TABS: 40.0000 mg | ORAL_TABLET | Freq: Every day | ORAL | Status: DC

## 2012-03-03 NOTE — Patient Instructions (Addendum)
Your physician recommends that you return for a FASTING lipid profile /BMET/BNP. This is scheduled for Thursday November 7,2013.  Your physician wants you to follow-up in: 6 months with Dr Shirlee Latch. (May 2014). You will receive a reminder letter in the mail two months in advance. If you don't receive a letter, please call our office to schedule the follow-up appointment.

## 2012-03-03 NOTE — Progress Notes (Signed)
Patient ID: [name], female   DOB: 30-Dec-1923, 77 y.o.   MRN: [id number] PCP: Dr. Jacky Kindle   78 yo with history of paroxysmal atrial fibrillation, nonobstructive CAD, and chronic exertional dyspnea presents for cardiology followup. Her main cardiopulmonary problem has been chronic exertional dyspnea. She has had quite significant shortness of breath for several years now.  Patient has had an extensive workup for dyspnea. She had a right and left heart cath in 2/11 that showed nonobstructive CAD as well as normal pulmonary artery pressure and normal wedge pressure. Echo showed normal LV systolic function. She had a V/Q scan in 7/11 with no evidence for PE. She never smoked. She was seen by pulmonology and it was thought that reflux may be a cause of her symptoms. She was started on bid Protonix with some improvement but really minimal. She was in our office in 3/12 for ETT-myoview as a pre-operative test before surgery for endometrial cancer. There was no evidence for ischemia or infarction on perfusion images or ECG but the patient's oxygen saturation dropped from 96% at rest to 83% at peak exertion. She then had a high-resolution chest CT showing no significant interstitial lung disease but there was evidence for emphysema.  PFTs showed normal spirometry but there was decreased DLCO. She saw pulmonology again and it was thought that she could have a component of emphysema from passive smoking. She also had overnight oximetry with significant desaturation and is on oxygen at night. I also started her on Lasix for a probable component of diastolic CHF.  With treatment of COPD with inhalers, treatment of diastolic CHF with Lasix, and treatment of nocturnal desaturation with oxygen, she felt better.  She has recently completed radiation for endometrial cancer.  She was very fatigued during radiation treatment but has started to feel better this week.  No chest pain.  She was able to walk 4 blocks recently  but was breathless by the end of the walk.  No tachypalpitations.  She is in NSR today.    Labs (4/12): hgb 10.6, LDL 84, HDL 68, BNP 98 Labs (6/12): LDL 78, HDL 67 Labs (2/13): K 4.3, creatinine 1.58, LDL 59, HDL 69, BNP 103  PMH:  1. Atrial fibrillation: Paroxysmal. She was on dronedarone briefly but it was stopped. She is on coumadin.  2. CHF: Diastolic. Echo (2/11) with mild LVH, EF 65-70%, PA systolic pressure 35 mmHg.  3. Carotid stenosis: right CEA in 2011.  4. Endometrial cancer s/p hysterectomy 4/12.  She had radiation after this.  5. HTN  6. Anemia  7. GERD/hiatal hernia  8. Hypothyroidism  9. TIA  10. CAD: LHC (2/11) with 40% LAD stenosis, 50% mCFX stenosis. ETT-myoview (3/12): 5:31 exercise, no ECG changes, oxygen saturation dropped from 96% at rest to 83% with exertion, no ischemia or infarction on perfusion images.  11. Dyspnea with exertion: Extensive workup in 2011. Echo and left heart cath as above. Right heart cath with mean PA pressure 23 mmHg and PCWP 11 mmHg. V/Q scan (7/11) low probability. PFTs (2/11) normal except for mildly decreased DLCO. ETT-myoview as above. O2 sats dropped with exercise. Patient was evaluated by pulmonology, thought to have GERD as cause of dyspnea.  CT chest without contrast (high resolution) in 4/12 showed emphysema but no interstitial lung disease.  PFTs (4/12) with normal spirometry but decreased DLCO.  Nocturnal oximetry showed desaturation and she is now on oxygen at night.  Cardiopulmonary exercise test suggested a component of diastolic dysfunction.  Dyspnea  thought to be multifactorial with emphysema, deconditioning, and diastolic CHF thought to play a role.   SH: Married, never smoked, retired. Lives in [place]. Daughter is involved in her care.   FH: CAD   Current Outpatient Prescriptions  Medication Sig Dispense Refill  . Aclidinium Bromide (TUDORZA PRESSAIR) 400 MCG/ACT AEPB Inhale 1 puff into the lungs 2 (two) times daily.  60  each  5  . bisoprolol (ZEBETA) 5 MG tablet Take 5 mg by mouth daily.        . Calcium Carbonate (CALTRATE 600 PO) Take by mouth daily.        Marland Kitchen diltiazem (CARDIZEM CD) 240 MG 24 hr capsule TAKE ONE CAPSULE BY MOUTH EVERY DAY  90 capsule  0  . furosemide (LASIX) 40 MG tablet Take 1 tablet (40 mg total) by mouth daily.  30 tablet  6  . iron polysaccharides (FERREX 150) 150 MG capsule Take 150 mg by mouth 2 (two) times daily.      Marland Kitchen levothyroxine (SYNTHROID, LEVOTHROID) 50 MCG tablet Take 50 mcg by mouth daily.        . Melatonin 3-10 MG TABS Take 1 tablet by mouth every evening.        . nitroGLYCERIN (NITROSTAT) 0.4 MG SL tablet Place 0.4 mg under the tongue every 5 (five) minutes as needed.        . potassium chloride SA (K-DUR,KLOR-CON) 20 MEQ tablet Take 1 tablet (20 mEq total) by mouth 2 (two) times daily.  60 tablet  6  . simvastatin (ZOCOR) 40 MG tablet TAKE 1 TABLET BY MOUTH EVERY EVENING  30 tablet  1  . warfarin (COUMADIN) 5 MG tablet Take 5 mg by mouth daily. As directed per Coumadin clinic       . [DISCONTINUED] furosemide (LASIX) 40 MG tablet TAKE 1 TABLET BY MOUTH EVERY DAY  30 tablet  6  . [DISCONTINUED] furosemide (LASIX) 40 MG tablet Take 0.5 tablets (20 mg total) by mouth 2 (two) times daily.  30 tablet  1    BP 114/60  Pulse 62  Ht 5\' 2"  (1.575 m)  Wt 141 lb (63.957 kg)  BMI 25.79 kg/m2 BP 135/65  HR 60 General: NAD  Neck: JVP not elevated, no thyromegaly or thyroid nodule.  Lungs: Clear to auscultation bilaterally with normal respiratory effort.  CV: Nondisplaced PMI. Heart regular S1/S2, no S3/S4, no murmur. No peripheral edema. No carotid bruit. Normal pedal pulses.  Abdomen: Soft, nontender, no hepatosplenomegaly, no distention.  Neurologic: Alert and oriented x 3.  Psych: Normal affect.  Extremities: No clubbing or cyanosis.   Assessment/Plan:  DYSPNEA Multifactorial but I suspect that the significant contributors are diastolic CHF, COPD, and nocturnal  desaturation. She has done better on Lasix, bronchodilators, and oxygen at night. Cardiopulmonary exercise test suggested that diastolic dysfunction is present. The treatment for this will be blood pressure control and lower filling pressures with Lasix. She does not appear volume overloaded on exam today. I will check a BNP today. Continue current Lasix dose for now.  Coronary artery disease Nonobstructive on last cath. Continue ASA 81 mg daily and statin.  Carotid stenosis S/p right CEA, follows with [facility] regularly.  ATRIAL FIBRILLATION  Paroxysmal. She is in sinus rhythm now. No recent atrial fibrillation-type symptoms. She is on coumadin.  HYPERTENSION   Blood pressure is reasonably controlled.  Hyperlipidemia Goal LDL < 70 with known vascular disease.  I will check lipids.    Marca Ancona 03/03/2012

## 2012-03-05 ENCOUNTER — Other Ambulatory Visit (INDEPENDENT_AMBULATORY_CARE_PROVIDER_SITE_OTHER): Payer: Medicare Other

## 2012-03-05 DIAGNOSIS — R0609 Other forms of dyspnea: Secondary | ICD-10-CM

## 2012-03-05 DIAGNOSIS — Z79899 Other long term (current) drug therapy: Secondary | ICD-10-CM

## 2012-03-05 DIAGNOSIS — R0989 Other specified symptoms and signs involving the circulatory and respiratory systems: Secondary | ICD-10-CM

## 2012-03-05 LAB — LIPID PANEL
Cholesterol: 143 mg/dL (ref 0–200)
HDL: 53.4 mg/dL (ref >39.00)
LDL Cholesterol: 62 mg/dL (ref 0–99)
Triglycerides: 137 mg/dL (ref 0.0–149.0)
VLDL: 27.4 mg/dL (ref 0.0–40.0)

## 2012-03-05 LAB — BASIC METABOLIC PANEL
BUN: 17 mg/dL (ref 6–23)
CO2: 28 mEq/L (ref 19–32)
Calcium: 9 mg/dL (ref 8.4–10.5)
Creatinine, Ser: 1.4 mg/dL — ABNORMAL HIGH (ref 0.4–1.2)
GFR: 38.2 mL/min — ABNORMAL LOW (ref >60.00)
Glucose, Bld: 100 mg/dL — ABNORMAL HIGH (ref 70–99)

## 2012-04-05 ENCOUNTER — Other Ambulatory Visit: Payer: Self-pay | Admitting: Cardiology

## 2012-05-11 ENCOUNTER — Other Ambulatory Visit: Payer: Self-pay | Admitting: Cardiovascular Disease

## 2012-05-25 ENCOUNTER — Encounter: Payer: Self-pay | Admitting: Cardiology

## 2012-06-07 ENCOUNTER — Other Ambulatory Visit: Payer: Self-pay | Admitting: Cardiology

## 2012-07-08 ENCOUNTER — Ambulatory Visit (INDEPENDENT_AMBULATORY_CARE_PROVIDER_SITE_OTHER): Payer: Medicare Other | Admitting: Internal Medicine

## 2012-07-08 ENCOUNTER — Encounter: Payer: Self-pay | Admitting: Internal Medicine

## 2012-07-08 VITALS — BP 118/66 | HR 68 | Temp 97.8°F | Ht 61.0 in | Wt 141.6 lb

## 2012-07-08 DIAGNOSIS — R0609 Other forms of dyspnea: Secondary | ICD-10-CM

## 2012-07-08 DIAGNOSIS — J439 Emphysema, unspecified: Secondary | ICD-10-CM

## 2012-07-08 DIAGNOSIS — R0600 Dyspnea, unspecified: Secondary | ICD-10-CM

## 2012-07-08 DIAGNOSIS — J438 Other emphysema: Secondary | ICD-10-CM

## 2012-07-08 DIAGNOSIS — I5189 Other ill-defined heart diseases: Secondary | ICD-10-CM

## 2012-07-08 DIAGNOSIS — R0602 Shortness of breath: Secondary | ICD-10-CM

## 2012-07-08 MED ORDER — TIOTROPIUM BROMIDE MONOHYDRATE 18 MCG IN CAPS: 18.0000 ug | ORAL_CAPSULE | Freq: Every day | RESPIRATORY_TRACT | Status: DC

## 2012-07-08 NOTE — Assessment & Plan Note (Signed)
##  Shortness of breath/Fatigue/Emphysema  - Continue oxygen at night  -Stop tudorza due to expense - Please start spiriva 1 puff daily - take sample, script and show technique - - I have discussed and referred you to pulmonary rehab    (> 50% of this 15 min visit spent in face to face counseling)

## 2012-07-08 NOTE — Patient Instructions (Addendum)
##  Shortness of breath/Fatigue/Emphysema  - Continue oxygen at night  -Stop tudorza due to expense - Please start spiriva 1 puff daily - take sample, script and show technique - - I have discussed and referred you to pulmonary rehab  #Pulmonary nodule  If you remember this was noted April 2012 and is stable compared to Jan 2-12 per report on Gastroenterology East CT chest  I will now follow this based on scans done at Laureate Psychiatric Clinic And Hospital instead of doing CT here   #FOllowup  -6 months or sooner if needed

## 2012-07-08 NOTE — Progress Notes (Signed)
Subjective:    Patient ID: [name], female    DOB: [birth year], 79 y.o.   MRN: [id number]  HPI OV 11/23/10: Followup dysponea. Since last visit, abdominal wound healed. She reports improved exercise tolerance and less dysponea. Now able to walk 2 blocks without dyspnea.  Dyspnea still relieved by rest. Also brought on by stooping over. Asociated improved ADLs at home plus. Reports sympotoms as mild-moderate. No associated cough, chest pain. Family noticed some associated edema (mild) in hands and feet past few days. Independent review CPST (done on QVAR, emphysema) 11/20/2010 suggests diasgtolic dysfunction - the O2 pulse is flat. She gave good effort and overall VO2 max and AT are normal without resp limitation or desaturation. PMhx review shows hx psotiive for diastolic chf on cath feb 2011  #Shortness of breath  CPSt bike test 11/20/2010 suggests DIASTOLIC DYSFUNCTION of heart as cause of shortness of breath  - this is when the heart muscle has problem relaxing when you exert yourself  Recommend you follow with Dr. Shirlee Latch for this  Recommend also you go through pulmonary rehab exercise program  Return to see me in 4-5 months to review how things are going  Come sooner if there are problems   #Pulmonary nodule  If you remember this was noted April 2012  You need repeat CT chest without contrast April 2013. My nurse will ensure there is an order  OV 04/05/2011 Followup dyspnea. Since last visit overall stable. Dyspnea somewhat improved. She is finishingup her GYN RX. Still some dyspnea with household activities. She is satisfied with her current quality of life but dtr wants her better and able to do more. Patient not so motivated. Dtr wondering about giving her nocturnal o2. Patient not sure about wanting to attend rehab; dtr quite keen she attend.   r#Shortness of breath Recommend also you go through pulmonary rehab exercise program; thanks for agreeing Return to see me in 4-5 months  to review how things are going Come sooner if there are problems #Pulmonary nodule  If you remember this was noted April 2012 You need repeat CT chest without contrast April 2013. My nurse will ensure there is an order #Followup  - April 2013 after CT chest  OV 09/13/2011  Started rehab after last OV and fatigue dyspnea improved but had cancer reucrence and so rehab sheleved. Has finished 10 out of 28 exernal XRT and has int XRT pending. Very fatigued and dyspneic again; feels this is due to XRT. Copliant wiht o2 and qvar. Not sure qvar is helping. In interim she did have ONO 04/12/11 - some 21 minutes spent pulse ox <88% but never below 83%. So she can continue to use nocturnal o2.   In terms of nodule,  [facility] APril 2013 PET and CT report shows stable RLL nodule since jan 2012 but also diffuse uptake thyroid on PET scan. Note: emphysema also seen on scan    Past, Family, Social reviewed: no change since last visit other than fact, cancer has recurred and she is undergoing XRT  #Shortness of breath/Fatigue  - Continue oxygen  - stop qvar  - see if tudorza samples (3 months) twice daily helps you  - when recovered from radiation treatment, call rehab and rejoin that  #Pulmonary nodule  If you remember this was noted April 2012 and is stable per report on [facility] CT chest I will now follow this based on scans done at [facility] instead of doing CT here  #Thyrooid uptake on PET  -  please asked the [facility] doctors what the diffuse uptake on thyroid on PET scan April 2013 means  #FOllowup  -3-4   Months or sooner if needed   OV 01/06/2012  Tudorza - helping. Using O2. Has had flu shot. No new problems. Struggling to agree to rehab.   XRT ended October 24, 2011 (26 external courses + 5 more internal) and so fatigue improving  Above now has reuslted in dyspnea improving. She is not compelled now to attend rehab. She initially said it helped her but dtr feels motivation is an issue. Patient also  feels she does not want to burden daughter. After much discussion she has agreed  Has had flu shot.    Past, Family, Social reviewed: no change since last visit. Done with XRT    #Shortness of breath/Fatigue/Emphysema  - congrats on flu shot  - Continue oxygen  -continue tudorza  - I have discussed and referred you to pulmonary rehab  #Pulmonary nodule  If you remember this was noted April 2012 and is stable compared to Jan 2-12 per report on [facility] CT chest  I will now follow this based on scans done at [facility] instead of doing CT here  #FOllowup  -6 months  OV 07/08/2012 She presents again with her daughter. The daughter feels that dyspnea some improvement as well as fatigue. They maintain that tudorza definitely helps but is too expensive because of $80 co-pay. However, they are not interested in Atrovent nebulizer due to inconvenience. They will settle for Spiriva. Currently dyspnea is exertional and class II to class III. There is associated fatigue although both are improved. There no new symptoms. I counseled her again about pulmonary rehabilitation. The daughter is extremely keen that she attends. She has reluctantly agreed  Past, Family, Social reviewed: no change since last visit   Review of Systems  Constitutional: Negative for fever and unexpected weight change.  HENT: Negative for ear pain, nosebleeds, congestion, sore throat, rhinorrhea, sneezing, trouble swallowing, dental problem, postnasal drip and sinus pressure.   Eyes: Negative for redness and itching.  Respiratory: Negative for cough, chest tightness, shortness of breath and wheezing.   Cardiovascular: Negative for palpitations and leg swelling.  Gastrointestinal: Negative for nausea and vomiting.  Genitourinary: Negative for dysuria.  Musculoskeletal: Negative for joint swelling.  Skin: Negative for rash.  Neurological: Negative for headaches.  Hematological: Does not bruise/bleed easily.   Psychiatric/Behavioral: Negative for dysphoric mood. The patient is not nervous/anxious.       Current outpatient prescriptions:Aclidinium Bromide (TUDORZA PRESSAIR) 400 MCG/ACT AEPB, Inhale 1 puff into the lungs 2 (two) times daily., Disp: 60 each, Rfl: 5;  bisoprolol (ZEBETA) 5 MG tablet, Take 5 mg by mouth daily.  , Disp: , Rfl: ;  Calcium Carbonate (CALTRATE 600 PO), Take by mouth daily.  , Disp: , Rfl: ;  diltiazem (CARDIZEM CD) 240 MG 24 hr capsule, TAKE ONE CAPSULE BY MOUTH EVERY DAY, Disp: 90 capsule, Rfl: 1 furosemide (LASIX) 40 MG tablet, Take 1 tablet (40 mg total) by mouth daily., Disp: 30 tablet, Rfl: 6;  iron polysaccharides (FERREX 150) 150 MG capsule, Take 150 mg by mouth 2 (two) times daily., Disp: , Rfl: ;  KLOR-CON M20 20 MEQ tablet, TAKE 1 TABLET BY MOUTH TWICE A DAY, Disp: 60 tablet, Rfl: 6;  levothyroxine (SYNTHROID, LEVOTHROID) 50 MCG tablet, Take 50 mcg by mouth daily.  , Disp: , Rfl:  Melatonin 3-10 MG TABS, Take 1 tablet by mouth every evening.  , Disp: ,  Rfl: ;  nitroGLYCERIN (NITROSTAT) 0.4 MG SL tablet, Place 0.4 mg under the tongue every 5 (five) minutes as needed.  , Disp: , Rfl: ;  simvastatin (ZOCOR) 40 MG tablet, TAKE 1 TABLET BY MOUTH EVERY EVENING, Disp: 30 tablet, Rfl: 5;  warfarin (COUMADIN) 5 MG tablet, Take 5 mg by mouth daily. As directed per Coumadin clinic , Disp: , Rfl:   Objective:   Physical Exam  Vitals reviewed. Constitutional: She is oriented to person, place, and time. She appears well-developed and well-nourished. No distress.  HENT:  Head: Normocephalic and atraumatic.  Right Ear: External ear normal.  Left Ear: External ear normal.  Mouth/Throat: Oropharynx is clear and moist. No oropharyngeal exudate.  Eyes: Conjunctivae and EOM are normal. Pupils are equal, round, and reactive to light. Right eye exhibits no discharge. Left eye exhibits no discharge. No scleral icterus.  Neck: Normal range of motion. Neck supple. No JVD present. No tracheal  deviation present. No thyromegaly present.  Cardiovascular: Normal rate, regular rhythm, normal heart sounds and intact distal pulses.  Exam reveals no gallop and no friction rub.   No murmur heard. Pulmonary/Chest: Effort normal and breath sounds normal. No respiratory distress. She has no wheezes. She has no rales. She exhibits no tenderness.  Abdominal: Soft. Bowel sounds are normal. She exhibits no distension and no mass. There is no tenderness. There is no rebound and no guarding.  Musculoskeletal: Normal range of motion. She exhibits no edema and no tenderness.  Lymphadenopathy:    She has no cervical adenopathy.  Neurological: She is alert and oriented to person, place, and time. She has normal reflexes. No cranial nerve deficit. She exhibits normal muscle tone. Coordination normal.    Skin: Skin is warm and dry. No rash noted. She is not diaphoretic. No erythema. No pallor.  Psychiatric: She has a normal mood and affect. Her behavior is normal. Judgment and thought content normal.         Assessment & Plan:

## 2012-08-13 ENCOUNTER — Telehealth (HOSPITAL_COMMUNITY): Payer: Self-pay | Admitting: *Deleted

## 2012-08-13 NOTE — Telephone Encounter (Signed)
Telephone call to patient, no answer, left message to return call. Cathie Olden RN

## 2012-08-23 ENCOUNTER — Other Ambulatory Visit: Payer: Self-pay | Admitting: Cardiology

## 2012-09-14 ENCOUNTER — Ambulatory Visit (HOSPITAL_COMMUNITY): Payer: Medicare Other

## 2012-09-16 ENCOUNTER — Encounter (HOSPITAL_COMMUNITY)
Admission: RE | Admit: 2012-09-16 | Discharge: 2012-09-16 | Disposition: A | Discharge status: Home / Self Care | Payer: Medicare Other | Source: Ambulatory Visit | Attending: Internal Medicine | Admitting: Internal Medicine

## 2012-09-16 ENCOUNTER — Encounter (HOSPITAL_COMMUNITY): Payer: Self-pay

## 2012-09-16 ENCOUNTER — Other Ambulatory Visit: Payer: Medicare Other

## 2012-09-16 DIAGNOSIS — R0989 Other specified symptoms and signs involving the circulatory and respiratory systems: Secondary | ICD-10-CM | POA: Insufficient documentation

## 2012-09-16 DIAGNOSIS — J438 Other emphysema: Secondary | ICD-10-CM | POA: Insufficient documentation

## 2012-09-16 DIAGNOSIS — I739 Peripheral vascular disease, unspecified: Secondary | ICD-10-CM | POA: Insufficient documentation

## 2012-09-16 DIAGNOSIS — Z5189 Encounter for other specified aftercare: Secondary | ICD-10-CM | POA: Insufficient documentation

## 2012-09-16 DIAGNOSIS — I5032 Chronic diastolic (congestive) heart failure: Secondary | ICD-10-CM | POA: Insufficient documentation

## 2012-09-16 DIAGNOSIS — R0609 Other forms of dyspnea: Secondary | ICD-10-CM | POA: Insufficient documentation

## 2012-09-16 DIAGNOSIS — I2510 Atherosclerotic heart disease of native coronary artery without angina pectoris: Secondary | ICD-10-CM | POA: Insufficient documentation

## 2012-09-16 DIAGNOSIS — J984 Other disorders of lung: Secondary | ICD-10-CM | POA: Insufficient documentation

## 2012-09-16 NOTE — Progress Notes (Signed)
Ms Pontillo is here today for orientation to Pulmonary Rehab.  She previously was enrolled, that session was interupted due to diagnosis and treatment for uterine cancer.  She has been released to restart.  She is accompanied by her daughter, Sheralyn Boatman, does not appear her stated age, color good, skin warm and dry, orient x 3.  Nurse assessment done,   Breath sounds normal , no peripheral edema.  Balance good, denies any falls, does her normal housework, she does need to stop for rest breaks due to fatigue and shortness of breath. Active with her family, just returned from a week at the beach. Demonstration and practice of PLB using pulse oximeter.  Patient able to return demonstration satisfactorily. Safety and hand hygiene in the exercise area reviewed with patient.  Patient voices understanding. Walk test to be done on o5/26/14 and exercise on 09/23/12.  Obtainable goals defined by patient.  We look forward to assisting her in achieving those goals.  Cathie Olden RN

## 2012-09-17 ENCOUNTER — Encounter (HOSPITAL_COMMUNITY): Admission: RE | Admit: 2012-09-17 | Payer: Medicare Other | Source: Ambulatory Visit

## 2012-09-22 ENCOUNTER — Other Ambulatory Visit: Payer: Self-pay | Admitting: Cardiology

## 2012-09-22 ENCOUNTER — Encounter (HOSPITAL_COMMUNITY)
Admission: RE | Admit: 2012-09-22 | Discharge: 2012-09-22 | Disposition: A | Discharge status: Home / Self Care | Payer: Medicare Other | Source: Ambulatory Visit | Attending: Internal Medicine | Admitting: Internal Medicine

## 2012-09-23 ENCOUNTER — Encounter: Payer: Self-pay | Admitting: Gynecology

## 2012-09-24 ENCOUNTER — Encounter (HOSPITAL_COMMUNITY)
Admission: RE | Admit: 2012-09-24 | Discharge: 2012-09-24 | Disposition: A | Discharge status: Home / Self Care | Payer: Medicare Other | Source: Ambulatory Visit | Attending: Internal Medicine | Admitting: Internal Medicine

## 2012-09-24 NOTE — Progress Notes (Signed)
Mrs Zimmerle was here today for her first day of exercise.  Walk test was done on 09/22/2012.  She walked 900 feet, needed to sit down at end of walk test.  Tolerated exercise today without difficulty. Oxygen levels stable 98-100%.  BP with exercise 116/48, exit BP 102/60.  Heart rate stable 57-73.

## 2012-09-29 ENCOUNTER — Encounter (HOSPITAL_COMMUNITY)
Admission: RE | Admit: 2012-09-29 | Discharge: 2012-09-29 | Disposition: A | Discharge status: Home / Self Care | Payer: Medicare Other | Source: Ambulatory Visit | Attending: Internal Medicine | Admitting: Internal Medicine

## 2012-09-29 DIAGNOSIS — I739 Peripheral vascular disease, unspecified: Secondary | ICD-10-CM | POA: Insufficient documentation

## 2012-09-29 DIAGNOSIS — R0989 Other specified symptoms and signs involving the circulatory and respiratory systems: Secondary | ICD-10-CM | POA: Insufficient documentation

## 2012-09-29 DIAGNOSIS — I2510 Atherosclerotic heart disease of native coronary artery without angina pectoris: Secondary | ICD-10-CM | POA: Insufficient documentation

## 2012-09-29 DIAGNOSIS — I5032 Chronic diastolic (congestive) heart failure: Secondary | ICD-10-CM | POA: Insufficient documentation

## 2012-09-29 DIAGNOSIS — J984 Other disorders of lung: Secondary | ICD-10-CM | POA: Insufficient documentation

## 2012-09-29 DIAGNOSIS — Z5189 Encounter for other specified aftercare: Secondary | ICD-10-CM | POA: Insufficient documentation

## 2012-09-29 DIAGNOSIS — R0609 Other forms of dyspnea: Secondary | ICD-10-CM | POA: Insufficient documentation

## 2012-09-29 DIAGNOSIS — J438 Other emphysema: Secondary | ICD-10-CM | POA: Insufficient documentation

## 2012-10-01 ENCOUNTER — Ambulatory Visit (INDEPENDENT_AMBULATORY_CARE_PROVIDER_SITE_OTHER): Payer: Medicare Other | Admitting: Gynecology

## 2012-10-01 ENCOUNTER — Encounter: Payer: Self-pay | Admitting: Gynecology

## 2012-10-01 ENCOUNTER — Encounter (HOSPITAL_COMMUNITY): Payer: Medicare Other

## 2012-10-01 VITALS — BP 122/70 | Ht 61.0 in | Wt 138.0 lb

## 2012-10-01 DIAGNOSIS — C55 Malignant neoplasm of uterus, part unspecified: Secondary | ICD-10-CM

## 2012-10-01 DIAGNOSIS — N952 Postmenopausal atrophic vaginitis: Secondary | ICD-10-CM

## 2012-10-01 DIAGNOSIS — Z780 Asymptomatic menopausal state: Secondary | ICD-10-CM

## 2012-10-01 NOTE — Progress Notes (Signed)
ENVI EAGLESON Sep 16, 1923 [id number]        79 y.o.  [id number] for followup exam.  Several issues noted below.  Past medical history,surgical history, medications, allergies, family history and social history were all reviewed and documented in the EPIC chart.  ROS:  Performed and pertinent positives and negatives are included in the history, assessment and plan .  Exam: Biomedical scientist Filed Vitals:   10/01/12 1048  BP: 122/70  Height: 5\' 1"  (1.549 m)  Weight: 138 lb (62.596 kg)   General appearance  Normal Skin grossly normal Head/Neck normal with no cervical or supraclavicular adenopathy thyroid normal Lungs  clear Cardiac RR, without RMG Abdominal  soft, nontender, without masses, organomegaly or hernia Breasts  examined lying and sitting without masses, retractions, discharge or axillary adenopathy. Pelvic  Ext/BUS/vagina  atrophic changes. Vagina somewhat foreshortened  Adnexa  Without masses or tenderness    Anus and perineum  normal   Rectovaginal  normal sphincter tone without palpated masses or tenderness.    Assessment/Plan:  79 y.o. [id number] female for followup exam.   1. 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 Noland Fordyce 07/2010. Had vaginal cuff recurrence 10/2010. Received radiation treatment completed June 2014. Just had an exam with Dr. Noland Fordyce at his office. Pap smear showed ASCUS/radiation effect. Exam today with atrophic changes but no evidence of disease. Patient has appointment to followup with Dr. Noland Fordyce at 3 months. 2. Atrophic vaginitis. Patient is asymptomatic. Check urinalysis. 3. Mammography 03/2012. Continue with annual mammography. 4. Colonoscopy 2011. Repeat at their recommended interval. 5. DEXA. Patient does not remember if she ever had a DEXA. Recommend baseline now. Increase calcium reviewed. 6. Health maintenance. No blood work done as it is all done through her primary physician's  office.   Dara Lords MD, 12:17 PM 10/01/2012

## 2012-10-01 NOTE — Patient Instructions (Signed)
Followup in 1 year. Continue to followup at [facility] with Dr. Noland Fordyce.

## 2012-10-02 LAB — URINALYSIS W MICROSCOPIC + REFLEX CULTURE
Bacteria, UA: NONE SEEN
Bilirubin Urine: NEGATIVE
Casts: NONE SEEN
Crystals: NONE SEEN
Ketones, ur: NEGATIVE mg/dL
Nitrite: NEGATIVE
Specific Gravity, Urine: 1.008 (ref 1.005–1.030)
Squamous Epithelial / LPF: NONE SEEN
pH: 5.5 (ref 5.0–8.0)

## 2012-10-04 LAB — URINE CULTURE

## 2012-10-06 ENCOUNTER — Encounter (HOSPITAL_COMMUNITY)
Admission: RE | Admit: 2012-10-06 | Discharge: 2012-10-06 | Disposition: A | Discharge status: Home / Self Care | Payer: Medicare Other | Source: Ambulatory Visit | Attending: Internal Medicine | Admitting: Internal Medicine

## 2012-10-08 ENCOUNTER — Encounter (HOSPITAL_COMMUNITY)
Admission: RE | Admit: 2012-10-08 | Discharge: 2012-10-08 | Disposition: A | Discharge status: Home / Self Care | Payer: Medicare Other | Source: Ambulatory Visit | Attending: Internal Medicine | Admitting: Internal Medicine

## 2012-10-13 ENCOUNTER — Encounter (HOSPITAL_COMMUNITY)
Admission: RE | Admit: 2012-10-13 | Discharge: 2012-10-13 | Disposition: A | Discharge status: Home / Self Care | Payer: Medicare Other | Source: Ambulatory Visit | Attending: Internal Medicine | Admitting: Internal Medicine

## 2012-10-15 ENCOUNTER — Encounter (HOSPITAL_COMMUNITY)
Admission: RE | Admit: 2012-10-15 | Discharge: 2012-10-15 | Disposition: A | Discharge status: Home / Self Care | Payer: Medicare Other | Source: Ambulatory Visit | Attending: Internal Medicine | Admitting: Internal Medicine

## 2012-10-16 ENCOUNTER — Other Ambulatory Visit: Payer: Self-pay | Admitting: Cardiology

## 2012-10-20 ENCOUNTER — Encounter (HOSPITAL_COMMUNITY)
Admission: RE | Admit: 2012-10-20 | Discharge: 2012-10-20 | Disposition: A | Discharge status: Home / Self Care | Payer: Medicare Other | Source: Ambulatory Visit | Attending: Internal Medicine | Admitting: Internal Medicine

## 2012-10-22 ENCOUNTER — Encounter (HOSPITAL_COMMUNITY)
Admission: RE | Admit: 2012-10-22 | Discharge: 2012-10-22 | Disposition: A | Discharge status: Home / Self Care | Payer: Medicare Other | Source: Ambulatory Visit | Attending: Internal Medicine | Admitting: Internal Medicine

## 2012-10-27 ENCOUNTER — Encounter (HOSPITAL_COMMUNITY)
Admission: RE | Admit: 2012-10-27 | Discharge: 2012-10-27 | Disposition: A | Discharge status: Home / Self Care | Payer: Medicare Other | Source: Ambulatory Visit | Attending: Internal Medicine | Admitting: Internal Medicine

## 2012-10-27 DIAGNOSIS — I739 Peripheral vascular disease, unspecified: Secondary | ICD-10-CM | POA: Insufficient documentation

## 2012-10-27 DIAGNOSIS — R0989 Other specified symptoms and signs involving the circulatory and respiratory systems: Secondary | ICD-10-CM | POA: Insufficient documentation

## 2012-10-27 DIAGNOSIS — Z5189 Encounter for other specified aftercare: Secondary | ICD-10-CM | POA: Insufficient documentation

## 2012-10-27 DIAGNOSIS — I5032 Chronic diastolic (congestive) heart failure: Secondary | ICD-10-CM | POA: Insufficient documentation

## 2012-10-27 DIAGNOSIS — J984 Other disorders of lung: Secondary | ICD-10-CM | POA: Insufficient documentation

## 2012-10-27 DIAGNOSIS — I2510 Atherosclerotic heart disease of native coronary artery without angina pectoris: Secondary | ICD-10-CM | POA: Insufficient documentation

## 2012-10-27 DIAGNOSIS — J438 Other emphysema: Secondary | ICD-10-CM | POA: Insufficient documentation

## 2012-10-27 DIAGNOSIS — R0609 Other forms of dyspnea: Secondary | ICD-10-CM | POA: Insufficient documentation

## 2012-10-29 ENCOUNTER — Encounter (HOSPITAL_COMMUNITY)
Admission: RE | Admit: 2012-10-29 | Discharge: 2012-10-29 | Disposition: A | Discharge status: Home / Self Care | Payer: Medicare Other | Source: Ambulatory Visit | Attending: Internal Medicine | Admitting: Internal Medicine

## 2012-11-03 ENCOUNTER — Encounter (HOSPITAL_COMMUNITY)
Admission: RE | Admit: 2012-11-03 | Discharge: 2012-11-03 | Disposition: A | Discharge status: Home / Self Care | Payer: Medicare Other | Source: Ambulatory Visit | Attending: Internal Medicine | Admitting: Internal Medicine

## 2012-11-04 ENCOUNTER — Other Ambulatory Visit: Payer: Self-pay | Admitting: Cardiovascular Disease

## 2012-11-05 ENCOUNTER — Encounter (HOSPITAL_COMMUNITY)
Admission: RE | Admit: 2012-11-05 | Discharge: 2012-11-05 | Disposition: A | Discharge status: Home / Self Care | Payer: Medicare Other | Source: Ambulatory Visit | Attending: Internal Medicine | Admitting: Internal Medicine

## 2012-11-10 ENCOUNTER — Encounter (HOSPITAL_COMMUNITY): Admission: RE | Admit: 2012-11-10 | Payer: Medicare Other | Source: Ambulatory Visit

## 2012-11-12 ENCOUNTER — Encounter (HOSPITAL_COMMUNITY)
Admission: RE | Admit: 2012-11-12 | Discharge: 2012-11-12 | Disposition: A | Discharge status: Home / Self Care | Payer: Medicare Other | Source: Ambulatory Visit | Attending: Internal Medicine | Admitting: Internal Medicine

## 2012-11-17 ENCOUNTER — Encounter (HOSPITAL_COMMUNITY)
Admission: RE | Admit: 2012-11-17 | Discharge: 2012-11-17 | Disposition: A | Discharge status: Home / Self Care | Payer: Medicare Other | Source: Ambulatory Visit | Attending: Internal Medicine | Admitting: Internal Medicine

## 2012-11-19 ENCOUNTER — Encounter (HOSPITAL_COMMUNITY)
Admission: RE | Admit: 2012-11-19 | Discharge: 2012-11-19 | Disposition: A | Discharge status: Home / Self Care | Payer: Medicare Other | Source: Ambulatory Visit | Attending: Internal Medicine | Admitting: Internal Medicine

## 2012-11-24 ENCOUNTER — Encounter (HOSPITAL_COMMUNITY)
Admission: RE | Admit: 2012-11-24 | Discharge: 2012-11-24 | Disposition: A | Discharge status: Home / Self Care | Payer: Medicare Other | Source: Ambulatory Visit | Attending: Internal Medicine | Admitting: Internal Medicine

## 2012-11-26 ENCOUNTER — Encounter (HOSPITAL_COMMUNITY)
Admission: RE | Admit: 2012-11-26 | Discharge: 2012-11-26 | Disposition: A | Discharge status: Home / Self Care | Payer: Medicare Other | Source: Ambulatory Visit | Attending: Internal Medicine | Admitting: Internal Medicine

## 2012-12-01 ENCOUNTER — Encounter (HOSPITAL_COMMUNITY)
Admission: RE | Admit: 2012-12-01 | Discharge: 2012-12-01 | Disposition: A | Discharge status: Home / Self Care | Payer: Medicare Other | Source: Ambulatory Visit | Attending: Internal Medicine | Admitting: Internal Medicine

## 2012-12-01 DIAGNOSIS — Z5189 Encounter for other specified aftercare: Secondary | ICD-10-CM | POA: Insufficient documentation

## 2012-12-01 DIAGNOSIS — I5032 Chronic diastolic (congestive) heart failure: Secondary | ICD-10-CM | POA: Insufficient documentation

## 2012-12-01 DIAGNOSIS — J438 Other emphysema: Secondary | ICD-10-CM | POA: Insufficient documentation

## 2012-12-01 DIAGNOSIS — I2510 Atherosclerotic heart disease of native coronary artery without angina pectoris: Secondary | ICD-10-CM | POA: Insufficient documentation

## 2012-12-01 DIAGNOSIS — R0989 Other specified symptoms and signs involving the circulatory and respiratory systems: Secondary | ICD-10-CM | POA: Insufficient documentation

## 2012-12-01 DIAGNOSIS — J984 Other disorders of lung: Secondary | ICD-10-CM | POA: Insufficient documentation

## 2012-12-01 DIAGNOSIS — R0609 Other forms of dyspnea: Secondary | ICD-10-CM | POA: Insufficient documentation

## 2012-12-01 DIAGNOSIS — I739 Peripheral vascular disease, unspecified: Secondary | ICD-10-CM | POA: Insufficient documentation

## 2012-12-03 ENCOUNTER — Encounter (HOSPITAL_COMMUNITY)
Admission: RE | Admit: 2012-12-03 | Discharge: 2012-12-03 | Disposition: A | Discharge status: Home / Self Care | Payer: Medicare Other | Source: Ambulatory Visit | Attending: Internal Medicine | Admitting: Internal Medicine

## 2012-12-08 ENCOUNTER — Encounter (HOSPITAL_COMMUNITY)
Admission: RE | Admit: 2012-12-08 | Discharge: 2012-12-08 | Disposition: A | Discharge status: Home / Self Care | Payer: Medicare Other | Source: Ambulatory Visit | Attending: Internal Medicine | Admitting: Internal Medicine

## 2012-12-09 ENCOUNTER — Other Ambulatory Visit (INDEPENDENT_AMBULATORY_CARE_PROVIDER_SITE_OTHER): Payer: Medicare Other | Admitting: *Deleted

## 2012-12-09 DIAGNOSIS — Z48812 Encounter for surgical aftercare following surgery on the circulatory system: Secondary | ICD-10-CM

## 2012-12-09 DIAGNOSIS — I6529 Occlusion and stenosis of unspecified carotid artery: Secondary | ICD-10-CM

## 2012-12-10 ENCOUNTER — Encounter (HOSPITAL_COMMUNITY)
Admission: RE | Admit: 2012-12-10 | Discharge: 2012-12-10 | Disposition: A | Discharge status: Home / Self Care | Payer: Medicare Other | Source: Ambulatory Visit | Attending: Internal Medicine | Admitting: Internal Medicine

## 2012-12-15 ENCOUNTER — Encounter (HOSPITAL_COMMUNITY)
Admission: RE | Admit: 2012-12-15 | Discharge: 2012-12-15 | Disposition: A | Discharge status: Home / Self Care | Payer: Medicare Other | Source: Ambulatory Visit | Attending: Internal Medicine | Admitting: Internal Medicine

## 2012-12-16 ENCOUNTER — Ambulatory Visit: Payer: Medicare Other | Admitting: Neurosurgery

## 2012-12-16 ENCOUNTER — Other Ambulatory Visit: Payer: Medicare Other

## 2012-12-17 ENCOUNTER — Encounter (HOSPITAL_COMMUNITY)
Admission: RE | Admit: 2012-12-17 | Discharge: 2012-12-17 | Disposition: A | Discharge status: Home / Self Care | Payer: Medicare Other | Source: Ambulatory Visit | Attending: Internal Medicine | Admitting: Internal Medicine

## 2012-12-22 ENCOUNTER — Telehealth (HOSPITAL_COMMUNITY): Payer: Self-pay | Admitting: *Deleted

## 2012-12-22 ENCOUNTER — Encounter (HOSPITAL_COMMUNITY): Admission: RE | Admit: 2012-12-22 | Payer: Medicare Other | Source: Ambulatory Visit

## 2012-12-24 ENCOUNTER — Encounter (HOSPITAL_COMMUNITY)
Admission: RE | Admit: 2012-12-24 | Discharge: 2012-12-24 | Disposition: A | Discharge status: Home / Self Care | Payer: Medicare Other | Source: Ambulatory Visit | Attending: Internal Medicine | Admitting: Internal Medicine

## 2012-12-24 ENCOUNTER — Encounter: Payer: Self-pay | Admitting: Vascular Surgery

## 2012-12-24 ENCOUNTER — Telehealth (HOSPITAL_COMMUNITY): Payer: Self-pay | Admitting: *Deleted

## 2012-12-24 ENCOUNTER — Other Ambulatory Visit: Payer: Self-pay | Admitting: *Deleted

## 2012-12-24 DIAGNOSIS — Z48812 Encounter for surgical aftercare following surgery on the circulatory system: Secondary | ICD-10-CM

## 2012-12-29 ENCOUNTER — Encounter (HOSPITAL_COMMUNITY): Admission: RE | Admit: 2012-12-29 | Payer: Medicare Other | Source: Ambulatory Visit

## 2012-12-31 ENCOUNTER — Encounter (HOSPITAL_COMMUNITY): Payer: Medicare Other

## 2013-01-05 ENCOUNTER — Encounter (HOSPITAL_COMMUNITY): Admission: RE | Admit: 2013-01-05 | Payer: Medicare Other | Source: Ambulatory Visit

## 2013-01-07 ENCOUNTER — Ambulatory Visit (INDEPENDENT_AMBULATORY_CARE_PROVIDER_SITE_OTHER): Payer: Medicare Other | Admitting: Internal Medicine

## 2013-01-07 ENCOUNTER — Encounter: Payer: Self-pay | Admitting: Internal Medicine

## 2013-01-07 VITALS — BP 118/78 | HR 58 | Ht 61.0 in | Wt 138.4 lb

## 2013-01-07 DIAGNOSIS — R911 Solitary pulmonary nodule: Secondary | ICD-10-CM

## 2013-01-07 DIAGNOSIS — R0602 Shortness of breath: Secondary | ICD-10-CM

## 2013-01-07 DIAGNOSIS — J984 Other disorders of lung: Secondary | ICD-10-CM

## 2013-01-07 NOTE — Progress Notes (Signed)
Subjective:    Patient ID: [name], female    DOB: 1923-07-22, 79 y.o.   MRN: [id number]  HPI OV 11/23/10: Followup dysponea. Since last visit, abdominal wound healed. She reports improved exercise tolerance and less dysponea. Now able to walk 2 blocks without dyspnea.  Dyspnea still relieved by rest. Also brought on by stooping over. Asociated improved ADLs at home plus. Reports sympotoms as mild-moderate. No associated cough, chest pain. Family noticed some associated edema (mild) in hands and feet past few days. Independent review CPST (done on QVAR, emphysema) 11/20/2010 suggests diasgtolic dysfunction - the O2 pulse is flat. She gave good effort and overall VO2 max and AT are normal without resp limitation or desaturation. PMhx review shows hx psotiive for diastolic chf on cath feb 2011  #Shortness of breath  CPSt bike test 11/20/2010 suggests DIASTOLIC DYSFUNCTION of heart as cause of shortness of breath  - this is when the heart muscle has problem relaxing when you exert yourself  Recommend you follow with Dr. Shirlee Latch for this  Recommend also you go through pulmonary rehab exercise program  Return to see me in 4-5 months to review how things are going  Come sooner if there are problems   #Pulmonary nodule  If you remember this was noted April 2012  You need repeat CT chest without contrast April 2013. My nurse will ensure there is an order  OV 04/05/2011 Followup dyspnea. Since last visit overall stable. Dyspnea somewhat improved. She is finishingup her GYN RX. Still some dyspnea with household activities. She is satisfied with her current quality of life but dtr wants her better and able to do more. Patient not so motivated. Dtr wondering about giving her nocturnal o2. Patient not sure about wanting to attend rehab; dtr quite keen she attend.   r#Shortness of breath Recommend also you go through pulmonary rehab exercise program; thanks for agreeing Return to see me in 4-5 months  to review how things are going Come sooner if there are problems #Pulmonary nodule  If you remember this was noted April 2012 You need repeat CT chest without contrast April 2013. My nurse will ensure there is an order #Followup  - April 2013 after CT chest  OV 09/13/2011  Started rehab after last OV and fatigue dyspnea improved but had cancer reucrence and so rehab sheleved. Has finished 10 out of 28 exernal XRT and has int XRT pending. Very fatigued and dyspneic again; feels this is due to XRT. Copliant wiht o2 and qvar. Not sure qvar is helping. In interim she did have ONO 04/12/11 - some 21 minutes spent pulse ox <88% but never below 83%. So she can continue to use nocturnal o2.   In terms of nodule,  [facility] APril 2013 PET and CT report shows stable RLL nodule since jan 2012 but also diffuse uptake thyroid on PET scan. Note: emphysema also seen on scan    Past, Family, Social reviewed: no change since last visit other than fact, cancer has recurred and she is undergoing XRT  #Shortness of breath/Fatigue  - Continue oxygen  - stop qvar  - see if tudorza samples (3 months) twice daily helps you  - when recovered from radiation treatment, call rehab and rejoin that  #Pulmonary nodule  If you remember this was noted April 2012 and is stable per report on [facility] CT chest I will now follow this based on scans done at [facility] instead of doing CT here  #Thyrooid uptake on PET  -  please asked the [facility] doctors what the diffuse uptake on thyroid on PET scan April 2013 means  #FOllowup  -3-4   Months or sooner if needed   OV 01/06/2012  Tudorza - helping. Using O2. Has had flu shot. No new problems. Struggling to agree to rehab.   XRT ended October 24, 2011 (26 external courses + 5 more internal) and so fatigue improving  Above now has reuslted in dyspnea improving. She is not compelled now to attend rehab. She initially said it helped her but dtr feels motivation is an issue. Patient also  feels she does not want to burden daughter. After much discussion she has agreed  Has had flu shot.    Past, Family, Social reviewed: no change since last visit. Done with XRT    #Shortness of breath/Fatigue/Emphysema  - congrats on flu shot  - Continue oxygen  -continue tudorza  - I have discussed and referred you to pulmonary rehab  #Pulmonary nodule  If you remember this was noted April 2012 and is stable compared to Jan 2-12 per report on [facility] CT chest  I will now follow this based on scans done at [facility] instead of doing CT here  #FOllowup  -6 months  OV 07/08/2012 She presents again with her daughter. The daughter feels that dyspnea some improvement as well as fatigue. They maintain that tudorza definitely helps but is too expensive because of $80 co-pay. However, they are not interested in Atrovent nebulizer due to inconvenience. They will settle for Spiriva. Currently dyspnea is exertional and class II to class III. There is associated fatigue although both are improved. There no new symptoms. I counseled her again about pulmonary rehabilitation. The daughter is extremely keen that she attends. She has reluctantly agreed  Past, Family, Social reviewed: no change since last visit  ##Shortness of breath/Fatigue/Emphysema  - Continue oxygen at night  -Stop tudorza due to expense  - Please start spiriva 1 puff daily - take sample, script and show technique  - - I have discussed and referred you to pulmonary rehab  #Pulmonary nodule  If you remember this was noted April 2012 and is stable compared in PET [facility] APril 2013 at [facility] I will now follow this based on scans done at [facility] instead of doing CT here  #FOllowup  -6 months or sooner if needed    OV 01/07/2013  Followup dyspnea due to mild emphysema and deconditioning and diastolic dysfunction  Last visit was in March 2014. We switched her anti-cholinergic to Spiriva which seems to be working well for her. She attend  pulmonary rehabilitation and this is also helped her dyspnea (though she told my CMA she was no better). There has been no admission for respirator exacerbations. There are some good days and bad days but overall stable. She has had a flu shot.  .This was a pulmonary nodule in the right lower lobe diagnosed at our placed in April 2012. This is 4-5 mm in size but in the setting of GYN malignancy. She is following with scans at [facility] for the GYN malignancy. However now the daughter says that it is been requested that I follow the nodules. Last scan was a pet CT done in April 2013 at [facility] and this was stable at that time. She is due for another scan now  Review of Systems  Constitutional: Negative for fever and unexpected weight change.  HENT: Positive for rhinorrhea. Negative for ear pain, nosebleeds, congestion, sore throat, sneezing, trouble  swallowing, dental problem, postnasal drip and sinus pressure.   Eyes: Negative for redness and itching.  Respiratory: Positive for cough and shortness of breath. Negative for chest tightness and wheezing.   Cardiovascular: Negative for palpitations and leg swelling.  Gastrointestinal: Negative for nausea and vomiting.  Genitourinary: Negative for dysuria.  Musculoskeletal: Negative for joint swelling.  Skin: Negative for rash.  Neurological: Negative for headaches.  Hematological: Does not bruise/bleed easily.  Psychiatric/Behavioral: Negative for dysphoric mood. The patient is not nervous/anxious.        Objective:   Physical Exam Vitals reviewed. Constitutional: She is oriented to person, place, and time. She appears well-developed and well-nourished. No distress.  HENT:  Head: Normocephalic and atraumatic.  Right Ear: External ear normal.  Left Ear: External ear normal.  Mouth/Throat: Oropharynx is clear and moist. No oropharyngeal exudate.  Eyes: Conjunctivae and EOM are normal. Pupils are equal, round, and reactive to light. Right  eye exhibits no discharge. Left eye exhibits no discharge. No scleral icterus.  Neck: Normal range of motion. Neck supple. No JVD present. No tracheal deviation present. No thyromegaly present.  Cardiovascular: Normal rate, regular rhythm, normal heart sounds and intact distal pulses.  Exam reveals no gallop and no friction rub.   No murmur heard. Pulmonary/Chest: Effort normal and breath sounds normal. No respiratory distress. She has no wheezes. She has no rales. She exhibits no tenderness.  Abdominal: Soft. Bowel sounds are normal. She exhibits no distension and no mass. There is no tenderness. There is no rebound and no guarding.  Musculoskeletal: Normal range of motion. She exhibits no edema and no tenderness.  Lymphadenopathy:    She has no cervical adenopathy.  Neurological: She is alert and oriented to person, place, and time. She has normal reflexes. No cranial nerve deficit. She exhibits normal muscle tone. Coordination normal.    Skin: Skin is warm and dry. No rash noted. She is not diaphoretic. No erythema. No pallor.  Psychiatric: She has a normal mood and affect. Her behavior is normal. Judgment and thought content normal.        Assessment & Plan:

## 2013-01-07 NOTE — Patient Instructions (Addendum)
##  Shortness of breath/Fatigue/Emphysema  - Continue oxygen at night  -Please continue spiriva 1 puff daily -  - Glad you went to rehab and it helped and you are some better; doubt we can get his any better - Glad you had flu shot this season  #Pulmonary nodule  First noted April 2012 and is stable on PET scan April 2013 (5mm) at Select Specialty Hospital - Dallas  - repeat ct chest wo contrast now as followup; do it at Richmond State Hospital - will inform you of results   #FOllowup  -12 months or sooner if needed

## 2013-01-09 NOTE — Assessment & Plan Note (Signed)
 #  Pulmonary nodule  First noted April 2012 and is stable on PET scan April 2013 (5mm) at [facility]  - repeat ct chest wo contrast now as followup; do it at [facility] - will inform you of results   #FOllowup  -12 months or sooner if needed

## 2013-01-09 NOTE — Assessment & Plan Note (Signed)
##  Shortness of breath/Fatigue/Emphysema  - Continue oxygen at night  -Please continue spiriva 1 puff daily -  - Glad you went to rehab and it helped and you are some better; doubt we can get his any better - Glad you had flu shot this season

## 2013-01-11 ENCOUNTER — Ambulatory Visit (INDEPENDENT_AMBULATORY_CARE_PROVIDER_SITE_OTHER)
Admission: RE | Admit: 2013-01-11 | Discharge: 2013-01-11 | Disposition: A | Discharge status: Home / Self Care | Payer: Medicare Other | Source: Ambulatory Visit | Attending: Internal Medicine | Admitting: Internal Medicine

## 2013-01-11 DIAGNOSIS — R911 Solitary pulmonary nodule: Secondary | ICD-10-CM

## 2013-01-13 ENCOUNTER — Telehealth: Payer: Self-pay | Admitting: Internal Medicine

## 2013-01-13 NOTE — Telephone Encounter (Signed)
[name]  LEt her know on CT chest 01/11/13 that nodule stable 2012 -> 2014. No further scan of chest needed from lung stand pinit unless GYN  ONC at [facility] tells so. I will just see her in 1 year   Dr. Kalman Shan, M.D., [facility].C.P Pulmonary and Critical Care Medicine Staff Physician [facility] Pager: [phone], If no answer or between  15:00h - 7:00h: call [phone]  01/13/2013 4:51 AM

## 2013-01-14 NOTE — Telephone Encounter (Signed)
[email or web address].Corry Ihnen, CMA  

## 2013-01-14 NOTE — Telephone Encounter (Signed)
I spoke with the pt and advised of results. Carron Curie, CMA

## 2013-01-14 NOTE — Telephone Encounter (Signed)
Pt is asking to speak w/ Victorino Dike in the next 20-30 minutes, please.  [name]

## 2013-01-14 NOTE — Telephone Encounter (Signed)
Returning call can be reached at [phone].[name]

## 2013-02-12 ENCOUNTER — Other Ambulatory Visit: Payer: Self-pay | Admitting: Cardiology

## 2013-02-14 ENCOUNTER — Other Ambulatory Visit: Payer: Self-pay | Admitting: Cardiology

## 2013-03-14 ENCOUNTER — Other Ambulatory Visit: Payer: Self-pay | Admitting: Cardiology

## 2013-03-28 ENCOUNTER — Other Ambulatory Visit: Payer: Self-pay | Admitting: Cardiology

## 2013-04-18 ENCOUNTER — Other Ambulatory Visit: Payer: Self-pay | Admitting: Cardiology

## 2013-04-24 ENCOUNTER — Other Ambulatory Visit: Payer: Self-pay | Admitting: Cardiology

## 2013-04-27 ENCOUNTER — Encounter: Payer: Self-pay | Admitting: *Deleted

## 2013-05-02 ENCOUNTER — Other Ambulatory Visit: Payer: Self-pay | Admitting: Cardiology

## 2013-05-02 ENCOUNTER — Inpatient Hospital Stay (HOSPITAL_COMMUNITY)
Admission: EM | Admit: 2013-05-02 | Discharge: 2013-05-05 | DRG: 378 | Disposition: A | Discharge status: Home / Self Care | Payer: Medicare Other | Attending: Internal Medicine | Admitting: Internal Medicine

## 2013-05-02 ENCOUNTER — Encounter (HOSPITAL_COMMUNITY): Payer: Self-pay | Admitting: Emergency Medicine

## 2013-05-02 DIAGNOSIS — Z7901 Long term (current) use of anticoagulants: Secondary | ICD-10-CM

## 2013-05-02 DIAGNOSIS — Z8542 Personal history of malignant neoplasm of other parts of uterus: Secondary | ICD-10-CM

## 2013-05-02 DIAGNOSIS — Z803 Family history of malignant neoplasm of breast: Secondary | ICD-10-CM

## 2013-05-02 DIAGNOSIS — Z66 Do not resuscitate: Secondary | ICD-10-CM | POA: Diagnosis present

## 2013-05-02 DIAGNOSIS — Z8673 Personal history of transient ischemic attack (TIA), and cerebral infarction without residual deficits: Secondary | ICD-10-CM

## 2013-05-02 DIAGNOSIS — I10 Essential (primary) hypertension: Secondary | ICD-10-CM | POA: Diagnosis present

## 2013-05-02 DIAGNOSIS — J449 Chronic obstructive pulmonary disease, unspecified: Secondary | ICD-10-CM | POA: Diagnosis present

## 2013-05-02 DIAGNOSIS — Z823 Family history of stroke: Secondary | ICD-10-CM

## 2013-05-02 DIAGNOSIS — E785 Hyperlipidemia, unspecified: Secondary | ICD-10-CM | POA: Diagnosis present

## 2013-05-02 DIAGNOSIS — D509 Iron deficiency anemia, unspecified: Secondary | ICD-10-CM | POA: Diagnosis present

## 2013-05-02 DIAGNOSIS — N183 Chronic kidney disease, stage 3 unspecified: Secondary | ICD-10-CM | POA: Diagnosis present

## 2013-05-02 DIAGNOSIS — E039 Hypothyroidism, unspecified: Secondary | ICD-10-CM | POA: Diagnosis present

## 2013-05-02 DIAGNOSIS — K922 Gastrointestinal hemorrhage, unspecified: Secondary | ICD-10-CM | POA: Diagnosis present

## 2013-05-02 DIAGNOSIS — J4489 Other specified chronic obstructive pulmonary disease: Secondary | ICD-10-CM | POA: Diagnosis present

## 2013-05-02 DIAGNOSIS — K5731 Diverticulosis of large intestine without perforation or abscess with bleeding: Principal | ICD-10-CM | POA: Diagnosis present

## 2013-05-02 DIAGNOSIS — Z8249 Family history of ischemic heart disease and other diseases of the circulatory system: Secondary | ICD-10-CM

## 2013-05-02 DIAGNOSIS — Z79899 Other long term (current) drug therapy: Secondary | ICD-10-CM

## 2013-05-02 DIAGNOSIS — I4891 Unspecified atrial fibrillation: Secondary | ICD-10-CM | POA: Diagnosis present

## 2013-05-02 DIAGNOSIS — D649 Anemia, unspecified: Secondary | ICD-10-CM | POA: Diagnosis present

## 2013-05-02 DIAGNOSIS — D62 Acute posthemorrhagic anemia: Secondary | ICD-10-CM

## 2013-05-02 DIAGNOSIS — I129 Hypertensive chronic kidney disease with stage 1 through stage 4 chronic kidney disease, or unspecified chronic kidney disease: Secondary | ICD-10-CM | POA: Diagnosis present

## 2013-05-02 HISTORY — DX: Age-related osteoporosis without current pathological fracture: M81.0

## 2013-05-02 MED ORDER — SODIUM CHLORIDE 0.9 % IV BOLUS (SEPSIS)
1000.0000 mL | Freq: Once | INTRAVENOUS | Status: AC
  Administered 2013-05-03: 1000 mL via INTRAVENOUS

## 2013-05-02 NOTE — ED Notes (Signed)
Pt states she was notice large amount of blood while watching TV, she also a large amount of blood in the commode. States she was not attempting to have a bowel movement. She states she had a small bowel movement today and maybe felt the need to bare down harder. She denies chest pain presently but admits to SOB at her baseline d/t COPD. She wears oxygen at night and uses a MDI during the day.

## 2013-05-03 ENCOUNTER — Encounter (HOSPITAL_COMMUNITY): Payer: Self-pay | Admitting: Internal Medicine

## 2013-05-03 DIAGNOSIS — K922 Gastrointestinal hemorrhage, unspecified: Secondary | ICD-10-CM | POA: Diagnosis present

## 2013-05-03 LAB — CBC
HCT: 22.7 % — ABNORMAL LOW (ref 36.0–46.0)
HEMATOCRIT: 33.2 % — AB (ref 36.0–46.0)
HEMOGLOBIN: 7.4 g/dL — AB (ref 12.0–15.0)
Hemoglobin: 10.9 g/dL — ABNORMAL LOW (ref 12.0–15.0)
MCH: 29.4 pg (ref 26.0–34.0)
MCH: 28.8 pg (ref 26.0–34.0)
MCHC: 32.6 g/dL (ref 30.0–36.0)
MCHC: 32.8 g/dL (ref 30.0–36.0)
MCV: 90.1 fL (ref 78.0–100.0)
MCV: 87.8 fL (ref 78.0–100.0)
Platelets: 184 10*3/uL (ref 150–400)
Platelets: 202 10*3/uL (ref 150–400)
RBC: 2.52 MIL/uL — ABNORMAL LOW (ref 3.87–5.11)
RBC: 3.78 MIL/uL — ABNORMAL LOW (ref 3.87–5.11)
RDW: 13.8 % (ref 11.5–15.5)
RDW: 14.5 % (ref 11.5–15.5)
WBC: 4 10*3/uL (ref 4.0–10.5)
WBC: 3.9 10*3/uL — ABNORMAL LOW (ref 4.0–10.5)

## 2013-05-03 LAB — COMPREHENSIVE METABOLIC PANEL
ALT: 30 U/L (ref 0–35)
AST: 29 U/L (ref 0–37)
Albumin: 3.5 g/dL (ref 3.5–5.2)
Alkaline Phosphatase: 100 U/L (ref 39–117)
BUN: 33 mg/dL — ABNORMAL HIGH (ref 6–23)
CALCIUM: 9.5 mg/dL (ref 8.4–10.5)
CHLORIDE: 101 meq/L (ref 96–112)
CO2: 28 meq/L (ref 19–32)
Creatinine, Ser: 1.66 mg/dL — ABNORMAL HIGH (ref 0.50–1.10)
GFR calc Af Amer: 31 mL/min — ABNORMAL LOW (ref >90)
GFR calc non Af Amer: 27 mL/min — ABNORMAL LOW (ref >90)
Glucose, Bld: 108 mg/dL — ABNORMAL HIGH (ref 70–99)
POTASSIUM: 4 meq/L (ref 3.7–5.3)
Sodium: 141 mEq/L (ref 137–147)
Total Bilirubin: 0.2 mg/dL — ABNORMAL LOW (ref 0.3–1.2)
Total Protein: 6.4 g/dL (ref 6.0–8.3)

## 2013-05-03 LAB — CBC WITH DIFFERENTIAL/PLATELET
Basophils Absolute: 0 10*3/uL (ref 0.0–0.1)
Basophils Relative: 1 % (ref 0–1)
EOS PCT: 5 % (ref 0–5)
Eosinophils Absolute: 0.2 10*3/uL (ref 0.0–0.7)
HCT: 27.4 % — ABNORMAL LOW (ref 36.0–46.0)
HEMOGLOBIN: 8.8 g/dL — AB (ref 12.0–15.0)
Lymphocytes Relative: 18 % (ref 12–46)
Lymphs Abs: 0.9 10*3/uL (ref 0.7–4.0)
MCH: 28.9 pg (ref 26.0–34.0)
MCHC: 32.1 g/dL (ref 30.0–36.0)
MCV: 89.8 fL (ref 78.0–100.0)
MONOS PCT: 17 % — AB (ref 3–12)
Monocytes Absolute: 0.8 10*3/uL (ref 0.1–1.0)
NEUTROS PCT: 59 % (ref 43–77)
Neutro Abs: 2.9 10*3/uL (ref 1.7–7.7)
Platelets: 249 10*3/uL (ref 150–400)
RBC: 3.05 MIL/uL — AB (ref 3.87–5.11)
RDW: 13.7 % (ref 11.5–15.5)
WBC: 4.8 10*3/uL (ref 4.0–10.5)

## 2013-05-03 LAB — PROTIME-INR
INR: 3.89 — AB (ref 0.00–1.49)
Prothrombin Time: 36.7 seconds — ABNORMAL HIGH (ref 11.6–15.2)

## 2013-05-03 LAB — PREPARE RBC (CROSSMATCH)

## 2013-05-03 LAB — ABO/RH: ABO/RH(D): A POS

## 2013-05-03 MED ORDER — FUROSEMIDE 40 MG PO TABS
40.0000 mg | ORAL_TABLET | Freq: Every day | ORAL | Status: DC
  Administered 2013-05-03 – 2013-05-04 (×2): 40 mg via ORAL
  Filled 2013-05-03 (×5): qty 1

## 2013-05-03 MED ORDER — ACETAMINOPHEN 650 MG RE SUPP: 650.0000 mg | Freq: Four times a day (QID) | RECTAL | Status: DC | PRN

## 2013-05-03 MED ORDER — SODIUM CHLORIDE 0.9 % IV SOLN
INTRAVENOUS | Status: DC
  Administered 2013-05-03: 11:00:00 20 mL/h via INTRAVENOUS

## 2013-05-03 MED ORDER — POLYETHYLENE GLYCOL 3350 17 G PO PACK
17.0000 g | PACK | Freq: Two times a day (BID) | ORAL | Status: DC
  Administered 2013-05-03 – 2013-05-04 (×4): 17 g via ORAL
  Filled 2013-05-03 (×6): qty 1

## 2013-05-03 MED ORDER — PHYTONADIONE 5 MG PO TABS
5.0000 mg | ORAL_TABLET | Freq: Once | ORAL | Status: AC
  Administered 2013-05-03: 5 mg via ORAL
  Filled 2013-05-03: qty 1

## 2013-05-03 MED ORDER — BISOPROLOL FUMARATE 5 MG PO TABS
5.0000 mg | ORAL_TABLET | Freq: Every day | ORAL | Status: DC
  Administered 2013-05-03 – 2013-05-04 (×2): 5 mg via ORAL
  Filled 2013-05-03 (×3): qty 1

## 2013-05-03 MED ORDER — TIOTROPIUM BROMIDE MONOHYDRATE 18 MCG IN CAPS
18.0000 ug | ORAL_CAPSULE | Freq: Every day | RESPIRATORY_TRACT | Status: DC
  Administered 2013-05-03 – 2013-05-04 (×2): 18 ug via RESPIRATORY_TRACT
  Filled 2013-05-03: qty 5

## 2013-05-03 MED ORDER — ACETAMINOPHEN 325 MG PO TABS: 650.0000 mg | ORAL_TABLET | Freq: Four times a day (QID) | ORAL | Status: DC | PRN

## 2013-05-03 MED ORDER — NITROGLYCERIN 0.4 MG SL SUBL: 0.4000 mg | SUBLINGUAL_TABLET | SUBLINGUAL | Status: DC | PRN

## 2013-05-03 MED ORDER — LEVOTHYROXINE SODIUM 50 MCG PO TABS
50.0000 ug | ORAL_TABLET | Freq: Every day | ORAL | Status: DC
  Administered 2013-05-03 – 2013-05-05 (×3): 50 ug via ORAL
  Filled 2013-05-03 (×4): qty 1

## 2013-05-03 MED ORDER — SIMVASTATIN 40 MG PO TABS: 40.0000 mg | ORAL_TABLET | Freq: Every day | ORAL | Status: DC

## 2013-05-03 MED ORDER — POLYSACCHARIDE IRON COMPLEX 150 MG PO CAPS
150.0000 mg | ORAL_CAPSULE | Freq: Two times a day (BID) | ORAL | Status: DC
  Administered 2013-05-03 – 2013-05-04 (×4): 150 mg via ORAL
  Filled 2013-05-03 (×6): qty 1

## 2013-05-03 MED ORDER — ACETAMINOPHEN 325 MG PO TABS
650.0000 mg | ORAL_TABLET | Freq: Once | ORAL | Status: AC
  Administered 2013-05-03: 650 mg via ORAL
  Filled 2013-05-03: qty 2

## 2013-05-03 MED ORDER — ONDANSETRON HCL 4 MG PO TABS: 4.0000 mg | ORAL_TABLET | Freq: Four times a day (QID) | ORAL | Status: DC | PRN

## 2013-05-03 MED ORDER — POTASSIUM CHLORIDE CRYS ER 20 MEQ PO TBCR
20.0000 meq | EXTENDED_RELEASE_TABLET | Freq: Every day | ORAL | Status: DC
  Administered 2013-05-03 – 2013-05-04 (×2): 20 meq via ORAL
  Filled 2013-05-03 (×3): qty 1

## 2013-05-03 MED ORDER — ATORVASTATIN CALCIUM 20 MG PO TABS
20.0000 mg | ORAL_TABLET | Freq: Every day | ORAL | Status: DC
  Administered 2013-05-03 – 2013-05-04 (×2): 20 mg via ORAL
  Filled 2013-05-03 (×3): qty 1

## 2013-05-03 MED ORDER — ONDANSETRON HCL 4 MG/2ML IJ SOLN: 4.0000 mg | Freq: Four times a day (QID) | INTRAMUSCULAR | Status: DC | PRN

## 2013-05-03 MED ORDER — DILTIAZEM HCL ER COATED BEADS 240 MG PO CP24
240.0000 mg | ORAL_CAPSULE | Freq: Every day | ORAL | Status: DC
  Administered 2013-05-03 – 2013-05-04 (×2): 240 mg via ORAL
  Filled 2013-05-03 (×3): qty 1

## 2013-05-03 NOTE — Consult Note (Signed)
[facility] CONSULT Reason for consult: lower G.I. bleeding Referring Physician:  Dr. Burnard Bunting. Primary G.I.: none  [name] is an 80 y.o. female.  HPI: she has multiple medical problems including atrial fibrillation requiring Coumadin therapy in the history of TIAs. In addition she has severe COPD and sees Dr. Chase Caller treats her with inhalers and oxygen therapy which she uses at night only. She has never been a smoker but notes that her entire life she has lived in the house where nearly everyone smoked. She had colonoscopy about 25 or 30 years ago. She does not remember the findings states that she was told it was okay. She has no family history of colon cancer or polyps. She tends toward constipation. She had EGD 7/11 showed some gastritis. She's not had any epigastric pain or discomfort. Yesterday, she was sitting there watching television and felt liquid which proved to be a small amount of bright red blood and subsequently passed more blood. She had no pain with [place]. She had had a normal bowel movement earlier in the day. She is always slightly constipated but this bowel movement was no more constipated than usual and she had no pain associated with. She's currently tolerating clear liquids and is pain-free. Her hemoglobin 7.4 and INR was 3.9. She currently is tolerating clear liquids and is asymptomatic.   Past Medical History  Diagnosis Date  . TIA (transient ischemic attack)   . Unspecified hypothyroidism   . Unspecified essential hypertension   . Allergic rhinitis, cause unspecified   . Hiatal hernia   . SOB (shortness of breath)     unexplained SOB onset fall 2010. PFT normal 06-12-09. Right heatrt Cath 06-26-09 nl PA but wedge 23 with non obst CAD, echo same day: Left ventricle: the cavity size was normal. Wall thickness was increased in a pattern of mild LVH. Systolic function was vigorous. Estimated ejection fraction 65% to 70%. Left atrium mildly dilated. nml  walking sats 11-17-2009  . Hyperlipidemia   . Arrhythmia atrial fib  . Carotid artery occlusion     carotid endarterectomy     . Atrial fibrillation     paroxymal  . Carcinoma of uterus Feb. 2012    High-grade serous stage Ia/vaginal cuff recurrence July 2012  . Osteoporosis     Past Surgical History  Procedure Laterality Date  . Tubal ligation  1970  . Carotid endarterectomy  08/04/2009    Right  CEA  . Abdominal hysterectomy  April 20,2012    by Dr. Rhodia Albright at  [facility]  . Internal radiation  June 13,2013 and  October 24, 2011    5 dose by Dr. Belva Bertin  . External radiation  May 7,2013- October 07, 2011    25 treatments   . Carotid endarterectomy      Family History  Problem Relation Age of Onset  . Heart disease Mother   . Hypertension Mother   . Stroke Mother   . Heart disease Sister 77    Heart Disease before age 43  . Lung cancer Sister   . COPD Sister   . Hypertension Sister   . Heart disease Brother   . Cancer Brother   . Hypertension Brother   . Coronary artery disease Other   . Breast cancer Daughter   . Prostate cancer Son     Social History:  reports that she has been passively smoking.  She has never used smokeless tobacco. She reports that she does not drink alcohol  or use illicit drugs.  Allergies:  Allergies  Allergen Reactions  . Albuterol Other (See Comments)    devloped tremors, swelling during PFT following albuterol    Medications; Prior to Admission medications   Medication Sig Start Date End Date Taking? Authorizing Provider  bisoprolol (ZEBETA) 5 MG tablet Take 5 mg by mouth every morning.   Yes Historical Provider, MD  Calcium Carb-Cholecalciferol (CALCIUM 600 + D) 600-200 MG-UNIT TABS Take 1 tablet by mouth 2 (two) times daily.   Yes Historical Provider, MD  diltiazem (CARDIZEM CD) 240 MG 24 hr capsule Take 240 mg by mouth every evening.   Yes Historical Provider, MD  furosemide (LASIX) 40 MG tablet Take 40 mg by mouth every morning.    Yes Historical Provider, MD  iron polysaccharides (FERREX 150) 150 MG capsule Take 150 mg by mouth 2 (two) times daily.   Yes Historical Provider, MD  levothyroxine (SYNTHROID, LEVOTHROID) 50 MCG tablet Take 50 mcg by mouth daily before breakfast.   Yes Historical Provider, MD  Melatonin 3-10 MG TABS Take 1 tablet by mouth every evening.     Yes Historical Provider, MD  nitroGLYCERIN (NITROSTAT) 0.4 MG SL tablet Place 0.4 mg under the tongue every 5 (five) minutes as needed.     Yes Historical Provider, MD  potassium chloride SA (K-DUR,KLOR-CON) 20 MEQ tablet Take 20 mEq by mouth 2 (two) times daily.   Yes Historical Provider, MD  simvastatin (ZOCOR) 20 MG tablet Take 20 mg by mouth 2 (two) times daily.   Yes Historical Provider, MD  tiotropium (SPIRIVA) 18 MCG inhalation capsule Place 1 capsule (18 mcg total) into inhaler and inhale daily. 07/08/12  Yes Brand Males, MD  warfarin (COUMADIN) 4 MG tablet Take 4 mg by mouth every evening.   Yes Historical Provider, MD   . acetaminophen  650 mg Oral Once  . atorvastatin  20 mg Oral q1800  . bisoprolol  5 mg Oral Daily  . diltiazem  240 mg Oral Daily  . furosemide  40 mg Oral Daily  . iron polysaccharides  150 mg Oral BID  . levothyroxine  50 mcg Oral Daily  . potassium chloride SA  20 mEq Oral Daily  . tiotropium  18 mcg Inhalation Daily   PRN Meds acetaminophen, acetaminophen, nitroGLYCERIN, ondansetron (ZOFRAN) IV, ondansetron Results for orders placed during the hospital encounter of 05/02/13 (from the past 48 hour(s))  ABO/RH     Status: None   Collection Time    05/03/13 12:00 AM      Result Value Range   ABO/RH(D) A POS    CBC WITH DIFFERENTIAL     Status: Abnormal   Collection Time    05/03/13 12:25 AM      Result Value Range   WBC 4.8  4.0 - 10.5 K/uL   RBC 3.05 (*) 3.87 - 5.11 MIL/uL   Hemoglobin 8.8 (*) 12.0 - 15.0 g/dL   HCT 27.4 (*) 36.0 - 46.0 %   MCV 89.8  78.0 - 100.0 fL   MCH 28.9  26.0 - 34.0 pg   MCHC 32.1   30.0 - 36.0 g/dL   RDW 13.7  11.5 - 15.5 %   Platelets 249  150 - 400 K/uL   Neutrophils Relative % 59  43 - 77 %   Lymphocytes Relative 18  12 - 46 %   Monocytes Relative 17 (*) 3 - 12 %   Eosinophils Relative 5  0 - 5 %   Basophils Relative  1  0 - 1 %   Neutro Abs 2.9  1.7 - 7.7 K/uL   Lymphs Abs 0.9  0.7 - 4.0 K/uL   Monocytes Absolute 0.8  0.1 - 1.0 K/uL   Eosinophils Absolute 0.2  0.0 - 0.7 K/uL   Basophils Absolute 0.0  0.0 - 0.1 K/uL   Smear Review MORPHOLOGY UNREMARKABLE    COMPREHENSIVE METABOLIC PANEL     Status: Abnormal   Collection Time    05/03/13 12:25 AM      Result Value Range   Sodium 141  137 - 147 mEq/L   Comment: Please note change in reference range.   Potassium 4.0  3.7 - 5.3 mEq/L   Comment: Please note change in reference range.   Chloride 101  96 - 112 mEq/L   CO2 28  19 - 32 mEq/L   Glucose, Bld 108 (*) 70 - 99 mg/dL   BUN 33 (*) 6 - 23 mg/dL   Creatinine, Ser 1.66 (*) 0.50 - 1.10 mg/dL   Calcium 9.5  8.4 - 10.5 mg/dL   Total Protein 6.4  6.0 - 8.3 g/dL   Albumin 3.5  3.5 - 5.2 g/dL   AST 29  0 - 37 U/L   ALT 30  0 - 35 U/L   Alkaline Phosphatase 100  39 - 117 U/L   Total Bilirubin 0.2 (*) 0.3 - 1.2 mg/dL   GFR calc non Af Amer 27 (*) >90 mL/min   GFR calc Af Amer 31 (*) >90 mL/min   Comment: (NOTE)     The eGFR has been calculated using the CKD EPI equation.     This calculation has not been validated in all clinical situations.     eGFR's persistently <90 mL/min signify possible Chronic Kidney     Disease.  TYPE AND SCREEN     Status: None   Collection Time    05/03/13 12:25 AM      Result Value Range   ABO/RH(D) A POS     Antibody Screen NEG     Sample Expiration 05/06/2013     Unit Number [id number]     Blood Component Type RED CELLS,LR     Unit division 00     Status of Unit ALLOCATED     Transfusion Status OK TO TRANSFUSE     Crossmatch Result Compatible     Unit Number [id number]     Blood Component Type RED CELLS,LR      Unit division 00     Status of Unit ALLOCATED     Transfusion Status OK TO TRANSFUSE     Crossmatch Result Compatible    PROTIME-INR     Status: Abnormal   Collection Time    05/03/13 12:25 AM      Result Value Range   Prothrombin Time 36.7 (*) 11.6 - 15.2 seconds   INR 3.89 (*) 0.00 - 1.49  CBC     Status: Abnormal   Collection Time    05/03/13  3:40 AM      Result Value Range   WBC 4.0  4.0 - 10.5 K/uL   RBC 2.52 (*) 3.87 - 5.11 MIL/uL   Hemoglobin 7.4 (*) 12.0 - 15.0 g/dL   Comment: REPEATED TO VERIFY   HCT 22.7 (*) 36.0 - 46.0 %   MCV 90.1  78.0 - 100.0 fL   MCH 29.4  26.0 - 34.0 pg   MCHC 32.6  30.0 - 36.0 g/dL   RDW 13.8  11.5 - 15.5 %   Platelets 184  150 - 400 K/uL   Comment: SPECIMEN CHECKED FOR CLOTS     DELTA CHECK NOTED  PREPARE RBC (CROSSMATCH)     Status: None   Collection Time    05/03/13  7:30 AM      Result Value Range   Order Confirmation ORDER PROCESSED BY BLOOD BANK                  Blood pressure 135/73, pulse 76, temperature 97.5 F (36.4 C), temperature source Oral, resp. rate 20, height 5' 2"  (1.575 m), weight 62.642 kg (138 lb 1.6 oz), SpO2 100.00%.  Physical exam:   General-- pleasant white female sitting in bed watching TV drinking clear liquids Heart-- regular rate and rhythm without murmurs are gallops Lungs--clear Abdomen-- soft and nontender   Assessment: 1. Lower G.I. bleeding. This is probably due to diverticulosis exacerbated by anticoagulation. The patient apparently had an endoscopic procedure 2011 but currently the epic system does not allow connection with this information. She is not sure if this was in upper endoscopy or colonoscopy. We'll try to obtain that information when the computer is able to connect. We will add Miralax to try to clear out her stool and hope that she stops bleeding. With all of other health problems will need to discuss with she and her family the need for colonoscopy is a potential risk and  benefits given her age and severe pulmonary disease. 2. Chronic atrial fibrillation with history of TIAs on chronic anticoagulation 3. Severe COPD. She is in inhalers and chronic 02 therapy at night and followed closely by pulmonary physician. This would clearly place her at higher risk for endoscopic procedures.   Plan: we will follow her with you and will begin Miralax therapy at this time.   Nadelyn Enriques JR,Tanika Bracco L 05/03/2013, 9:03 AM

## 2013-05-03 NOTE — ED Notes (Signed)
MD at bedside. 

## 2013-05-03 NOTE — ED Notes (Signed)
Family at bedside. 

## 2013-05-03 NOTE — Progress Notes (Signed)
PHARMACIST-PHYSICIAN COMMUNICATION  Topic: Statin Substitution Due to Drug Interaction  The order for simvastatin(Zocor) was changed to an equivalent dose of atorvastatin(Lipitor) due to the potential drug interaction with diltiazem.  When taken in combination with medications that inhibit its metabolism, simvastatin can accumulate and create an increased risk for liver toxicity, myopathy, and rhabdomyolysis.  The simvastatin dose should not exceed 10mg /day in patients taking verapamil, diltiazem, fibrates, or niacin >or= 1 gram/day.   The simvastatin dose should not exceed 20mg /day in patients taking amlodipine, ranolazine or amiodarone.   Please consider this potential interaction at discharge.  MillingtonPh. 05/03/2013 9:08 AM

## 2013-05-03 NOTE — ED Provider Notes (Signed)
CSN: [id number]     Arrival date & time 05/02/13  2320 History   First MD Initiated Contact with Patient 05/02/13 2341     No chief complaint on file.  (Consider location/radiation/quality/duration/timing/severity/associated sxs/prior Treatment) HPI  This is an 80 year old female with a history of uterine carcinoma, atrial fibrillation, shortness of breath, TIA who presents with bright red blood per rectum. Patient states that she was sitting on the couch when she felt something wet. She looked down and her close were soaked with bright red blood. She subsequently went to the bathroom, did not have a bowel movement but noted right rib blood in the toilet. She denies any rectal pain or abdominal pain.  Has no prior history of this. Patient is currently on Coumadin. She reports dizziness without syncope. Patient also reports baseline shortness of breath. She wears oxygen at night.  Past Medical History  Diagnosis Date  . TIA (transient ischemic attack)   . Unspecified hypothyroidism   . Unspecified essential hypertension   . Allergic rhinitis, cause unspecified   . Hiatal hernia   . SOB (shortness of breath)     unexplained SOB onset fall 2010. PFT normal 06-12-09. Right heatrt Cath 06-26-09 nl PA but wedge 23 with non obst CAD, echo same day: Left ventricle: the cavity size was normal. Wall thickness was increased in a pattern of mild LVH. Systolic function was vigorous. Estimated ejection fraction 65% to 70%. Left atrium mildly dilated. nml walking sats 11-17-2009  . Hyperlipidemia   . Arrhythmia atrial fib  . Carotid artery occlusion     carotid endarterectomy     . Atrial fibrillation   . Carcinoma of uterus Feb. 2012    High-grade serous stage Ia/vaginal cuff recurrence July 2012   Past Surgical History  Procedure Laterality Date  . Tubal ligation  1970  . Carotid endarterectomy  08/04/2009    Right  CEA  . Abdominal hysterectomy  April 20,2012    by Dr. Rhodia Albright at  [facility]  .  Internal radiation  June 13,2013 and  October 24, 2011    5 dose by Dr. Belva Bertin  . External radiation  May 7,2013- October 07, 2011    25 treatments   . Carotid endarterectomy     Family History  Problem Relation Age of Onset  . Heart disease Mother   . Hypertension Mother   . Stroke Mother   . Heart disease Sister 88    Heart Disease before age 71  . Lung cancer Sister   . COPD Sister   . Hypertension Sister   . Heart disease Brother   . Cancer Brother   . Hypertension Brother   . Coronary artery disease Other   . Breast cancer Daughter   . Prostate cancer Son    History  Substance Use Topics  . Smoking status: Passive Smoke Exposure - Never Smoker  . Smokeless tobacco: Never Used  . Alcohol Use: No   OB History   Grav Para Term Preterm Abortions TAB SAB Ect Mult Living   7 7 7       7      Review of Systems  Constitutional: Negative for fever.  Respiratory: Negative for cough, chest tightness and shortness of breath.   Cardiovascular: Negative for chest pain.  Gastrointestinal: Positive for blood in stool. Negative for nausea, vomiting and abdominal pain.  Genitourinary: Negative for dysuria.  Musculoskeletal: Negative for back pain.  Neurological: Positive for dizziness. Negative for syncope and headaches.  All other systems reviewed and are negative.    Allergies  Albuterol  Home Medications   No current outpatient prescriptions on file. BP 135/73  Pulse 76  Temp(Src) 97.5 F (36.4 C) (Oral)  Resp 20  Ht 5\' 2"  (1.575 m)  Wt 138 lb 1.6 oz (62.642 kg)  BMI 25.25 kg/m2  SpO2 100% Physical Exam  Nursing note and vitals reviewed. Constitutional: She is oriented to person, place, and time. She appears well-developed and well-nourished. No distress.  HENT:  Head: Normocephalic and atraumatic.  Eyes: Pupils are equal, round, and reactive to light.  Neck: Neck supple.  Cardiovascular: Normal rate, regular rhythm and normal heart sounds.   No murmur  heard. Pulmonary/Chest: Effort normal and breath sounds normal. No respiratory distress. She has no wheezes.  Abdominal: Soft. Bowel sounds are normal. She exhibits no mass. There is no tenderness. There is no rebound and no guarding.  Genitourinary:  Normal rectal tone, external hemorrhoids noted, +gross blood on rectal exam  Musculoskeletal: She exhibits no edema.  Neurological: She is alert and oriented to person, place, and time.  Skin: Skin is warm and dry.  Psychiatric: She has a normal mood and affect.    ED Course  Procedures (including critical care time) Labs Review Labs Reviewed  CBC WITH DIFFERENTIAL - Abnormal; Notable for the following:    RBC 3.05 (*)    Hemoglobin 8.8 (*)    HCT 27.4 (*)    Monocytes Relative 17 (*)    All other components within normal limits  COMPREHENSIVE METABOLIC PANEL - Abnormal; Notable for the following:    Glucose, Bld 108 (*)    BUN 33 (*)    Creatinine, Ser 1.66 (*)    Total Bilirubin 0.2 (*)    GFR calc non Af Amer 27 (*)    GFR calc Af Amer 31 (*)    All other components within normal limits  PROTIME-INR - Abnormal; Notable for the following:    Prothrombin Time 36.7 (*)    INR 3.89 (*)    All other components within normal limits  CBC - Abnormal; Notable for the following:    RBC 2.52 (*)    Hemoglobin 7.4 (*)    HCT 22.7 (*)    All other components within normal limits  TYPE AND SCREEN  ABO/RH   Imaging Review No results found.  EKG Interpretation    Date/Time:    Ventricular Rate:    PR Interval:    QRS Duration:   QT Interval:    QTC Calculation:   R Axis:     Text Interpretation:              MDM   1. Acute GI bleeding   2. Acute blood loss anemia    Per patient presents with bright red blood per rectum.  She is nontoxic-appearing on exam and vital signs are within normal limits. Patient was orthostatic. She had one episode of bright red bleeding while in the emergency room that was self-limited.  Suspect lower GI bleed.  Hemoglobin 8.8 which is down from 10.5. Patient was given normal saline bolus. INR is elevated. Will give a dose of vitamin K.  I discussed the patient with Dr. Paulita Fujita with gastroenterology he will assess the patient in the morning. Patient will be admitted to Dr. Brigitte Pulse.    Merryl Hacker, MD 05/03/13 [phone]

## 2013-05-03 NOTE — H&P (Signed)
PCP:   Geoffery Lyons, MD   Chief Complaint:  Blood leaking from rectum  HPI: [name] is an 80 year old white female with a history of atrial fibrillation on anticoagulation, diastolic dysfunction, and history of TIA who presented to the emergency department with blood leaking from her rectum. The patient states she was in her usual state of health when she was sitting on the couch last evening around 7 PM. She noticed a wet feeling and when she looked she had a large amount of blood that had leaked out of her rectum. She did feel weak and lightheaded propping her to come to the emergency department. Since that time, she has had repeated episodes of blood leaking from her rectum. She denies any abdominal pain or rectal pain. No recent change in bowel movements preceding this event. She does have a long-standing history of iron deficiency anemia and underwent an endoscopy by Dr. Benson Norway in 7/11 which showed mild gastritis.  She does not recall having a colonoscopy at that time or within the past 10-15 years.  Most recent hemoglobin was 9.9 on 04/06/13.  She has been taking iron at home. She has not noticed any blood in her stool prior to last evening. Hemoglobin in the emergency department was initially 8.8 and dropped to 7.4 subsequently. She was mildly orthostatic but vital signs are stable.  She is being admitted for further management  Review of Systems:  Review of Systems - All systems reviewed patient are negative except in history of present illness with the following exceptions: hard of hearing, chronic dyspnea  Past Medical History: Past Medical History  Diagnosis Date  . TIA (transient ischemic attack)   . Unspecified hypothyroidism   . Unspecified essential hypertension   . Allergic rhinitis, cause unspecified   . Hiatal hernia   . SOB (shortness of breath)     unexplained SOB onset fall 2010. PFT normal 06-12-09. Right heatrt Cath 06-26-09 nl PA but wedge 23 with non obst CAD, echo  same day: Left ventricle: the cavity size was normal. Wall thickness was increased in a pattern of mild LVH. Systolic function was vigorous. Estimated ejection fraction 65% to 70%. Left atrium mildly dilated. nml walking sats 11-17-2009  . Hyperlipidemia   . Arrhythmia atrial fib  . Carotid artery occlusion     carotid endarterectomy     . Atrial fibrillation   . Carcinoma of uterus Feb. 2012    High-grade serous stage Ia/vaginal cuff recurrence July 2012     Past Surgical History  Procedure Laterality Date  . Tubal ligation  1970  . Carotid endarterectomy  08/04/2009    Right  CEA  . Abdominal hysterectomy  April 20,2012    by Dr. Rhodia Albright at  [facility]  . Internal radiation  June 13,2013 and  October 24, 2011    5 dose by Dr. Belva Bertin  . External radiation  May 7,2013- October 07, 2011    25 treatments   . Carotid endarterectomy      Medications: Prior to Admission medications   Medication Sig Start Date End Date Taking? Authorizing Provider  bisoprolol (ZEBETA) 5 MG tablet Take 5 mg by mouth daily.      Historical Provider, MD  Calcium Carbonate (CALTRATE 600 PO) Take by mouth daily.      Historical Provider, MD  diltiazem (CARDIZEM CD) 240 MG 24 hr capsule TAKE ONE CAPSULE BY MOUTH EVERY DAY 04/18/13   Larey Dresser, MD  furosemide (LASIX) 40 MG tablet  TAKE 1 TABLET BY MOUTH EVERY DAY 09/22/12   Larey Dresser, MD  iron polysaccharides (FERREX 150) 150 MG capsule Take 150 mg by mouth 2 (two) times daily.    Historical Provider, MD  KLOR-CON M20 20 MEQ tablet TAKE 1 TABLET BY MOUTH TWICE A DAY 06/07/12   Larey Dresser, MD  KLOR-CON M20 20 MEQ tablet TAKE 1 TABLET BY MOUTH TWICE A DAY 04/24/13   Larey Dresser, MD  levothyroxine (SYNTHROID, LEVOTHROID) 50 MCG tablet Take 50 mcg by mouth daily.      Historical Provider, MD  Melatonin 3-10 MG TABS Take 1 tablet by mouth every evening.      Historical Provider, MD  nitroGLYCERIN (NITROSTAT) 0.4 MG SL tablet Place 0.4 mg under the  tongue every 5 (five) minutes as needed.      Historical Provider, MD  simvastatin (ZOCOR) 40 MG tablet TAKE 1 TABLET BY MOUTH EVERY EVENING 03/28/13   Larey Dresser, MD  tiotropium (SPIRIVA) 18 MCG inhalation capsule Place 1 capsule (18 mcg total) into inhaler and inhale daily. 07/08/12   Brand Males, MD  warfarin (COUMADIN) 5 MG tablet Take 4 mg by mouth daily. As directed per Coumadin clinic    Historical Provider, MD    Allergies:   Allergies  Allergen Reactions  . Albuterol     devloped tremors, swelling during PFT following albuterol    Social History: Married with 7 children.  High school graduate. Occupation: Consulting civil engineer at [employer] No Tobacco. No Alcohol.  Family History: Family history of CAD, HTN, cancer Mother died in her late 76s of HTN and CAD Father died in his 39s of MVA.   Physical Exam: Filed Vitals:   05/03/13 0045 05/03/13 0215 05/03/13 0300 05/03/13 0410  BP: 129/60 129/58 120/75 135/73  Pulse: 76     Temp:    97.5 F (36.4 C)  TempSrc:      Resp: 6 17 13 20   Height:    5\' 2"  (1.575 m)  Weight:    62.642 kg (138 lb 1.6 oz)  SpO2: 100%   100%   General appearance: alert and no distress Head: Normocephalic, without obvious abnormality, atraumatic Eyes: conjunctivae/corneas clear. PERRL, EOM's intact.  Nose: Nares normal. Septum midline. Mucosa normal. No drainage or sinus tenderness. Throat: lips, mucosa, and tongue normal; teeth and gums normal Neck: no adenopathy, no carotid bruit, no JVD and thyroid not enlarged, symmetric, no tenderness/mass/nodules Resp: clear to auscultation bilaterally Cardio: regular rate and rhythm GI: soft, non-tender; bowel sounds normal; no masses,  no organomegaly Extremities: extremities normal, atraumatic, no cyanosis or edema Pulses: 2+ and symmetric Lymph nodes: no cervical lymphadenopathy Neurologic: Alert and oriented X 3.    Labs on Admission:   Recent Labs  05/03/13 0025  NA  141  K 4.0  CL 101  CO2 28  GLUCOSE 108*  BUN 33*  CREATININE 1.66*  CALCIUM 9.5    Recent Labs  05/03/13 0025  AST 29  ALT 30  ALKPHOS 100  BILITOT 0.2*  PROT 6.4  ALBUMIN 3.5   No results found for this basename: LIPASE, AMYLASE,  in the last 72 hours  Recent Labs  05/03/13 0025 05/03/13 0340  WBC 4.8 4.0  NEUTROABS 2.9  --   HGB 8.8* 7.4*  HCT 27.4* 22.7*  MCV 89.8 90.1  PLT 249 184   No results found for this basename: CKTOTAL, CKMB, CKMBINDEX, TROPONINI,  in the last 72 hours Lab Results  Component Value Date   INR 3.89* 05/03/2013   INR 2.1 01/16/2010   INR 3.3 12/19/2009    Radiological Exams on Admission: No results found. Orders placed in visit on 03/03/12  . EKG 12-LEAD    Assessment/Plan Principal Problem: 1. GI bleed-painless hematochezia is consistent with a lower GI bleed-most likely diverticular in the absence of abdominal pain, though differential includes AVMs, polyps/malignancy. We'll treat supportively with close monitoring of hemoglobin. We'll transfuse 2 units packed red blood cells to keep hemoglobin above 8.0. GI has been consulted in the emergency room.  Active Problems: 2. ANEMIA- acute blood loss anemia superimposed on chronic anemia secondary to GI blood loss. We'll transfuse to keep hemoglobin above 8.0. Continue iron. 3. Atrial fibrillation on anticoagulation-she has received vitamin K in the emergency department to reverse her coagulopathy. We'll monitor INR and hold Coumadin until bleeding has completely resolved. She does have a remote history of TIAs but none recently and no prior stroke.  Therefore, risk of anticoagulation exceeds benefit at this point. 4. HYPERTENSION-her blood pressure appear stable. We'll continue her home regimen including Lasix with her transfusion.  5. Ethics- discuss CODE STATUS with patient. She prefers DO NOT RESUSCITATE status. 6. Disposition- anticipate discharge to home in 2-3 days if bleeding results  and hemoglobin remains stable  Fermon Ureta,W DOUGLAS 05/03/2013, 5:47 AM

## 2013-05-03 NOTE — ED Notes (Signed)
Per pt and NT, pt had spontaneous episode of rectal bleeding while standing to get orthostatic VS.

## 2013-05-03 NOTE — Progress Notes (Signed)
Have paged Reidle, NP, who is on call to notify that pt is on the floor in 1435 as ordered.  Awaiting response.  Pt is has stable VS, NAD.  Did have a moderate sized bloody stool upon arrival to floor.  PT reports that that is the 5th bloody stool in the past 24 hours.    Pt denies pain or dizziness, is alert and oriented.  Lung sounds are clear.   Some DOE noted.  100% on 2l./m.   Last HGB is 7.4.  Has a hx of Diverticulosis and is on coumadin for hx of afi/TIAb per dtr.   INR is 3.89 and ED RN states she had administered VIT K.  Is SR c PJC's rate is in the 80's.

## 2013-05-04 LAB — TYPE AND SCREEN
ABO/RH(D): A POS
Antibody Screen: NEGATIVE
UNIT DIVISION: 0
Unit division: 0

## 2013-05-04 LAB — BASIC METABOLIC PANEL
BUN: 21 mg/dL (ref 6–23)
CHLORIDE: 105 meq/L (ref 96–112)
CO2: 26 meq/L (ref 19–32)
Calcium: 8.7 mg/dL (ref 8.4–10.5)
Creatinine, Ser: 1.47 mg/dL — ABNORMAL HIGH (ref 0.50–1.10)
GFR calc non Af Amer: 31 mL/min — ABNORMAL LOW (ref >90)
GFR, EST AFRICAN AMERICAN: 36 mL/min — AB (ref >90)
Glucose, Bld: 100 mg/dL — ABNORMAL HIGH (ref 70–99)
POTASSIUM: 4.4 meq/L (ref 3.7–5.3)
SODIUM: 141 meq/L (ref 137–147)

## 2013-05-04 LAB — CBC
HEMATOCRIT: 32.9 % — AB (ref 36.0–46.0)
HEMOGLOBIN: 11 g/dL — AB (ref 12.0–15.0)
MCH: 29.1 pg (ref 26.0–34.0)
MCHC: 33.4 g/dL (ref 30.0–36.0)
MCV: 87 fL (ref 78.0–100.0)
Platelets: 208 10*3/uL (ref 150–400)
RBC: 3.78 MIL/uL — AB (ref 3.87–5.11)
RDW: 14.8 % (ref 11.5–15.5)
WBC: 3.2 10*3/uL — AB (ref 4.0–10.5)

## 2013-05-04 LAB — PROTIME-INR
INR: 1.47 (ref 0.00–1.49)
PROTHROMBIN TIME: 17.4 s — AB (ref 11.6–15.2)

## 2013-05-04 NOTE — Progress Notes (Signed)
Subjective: Well, alert, good with liquids, 1 soft dark stool, no pain  Objective: Vital signs in last 24 hours: Temp:  [97.3 F (36.3 C)-98.6 F (37 C)] 98.4 F (36.9 C) (01/06 0404) Pulse Rate:  [74-80] 74 (01/06 1013) Resp:  [16-20] 18 (01/06 0404) BP: (112-145)/(52-86) 135/75 mmHg (01/06 1013) SpO2:  [96 %-100 %] 99 % (01/06 0404) Weight change:   CBG (last 3)  No results found for this basename: GLUCAP,  in the last 72 hours  Intake/Output from previous day: 01/05 0701 - 01/06 0700 In: 1380 [P.O.:480; I.V.:250; Blood:650] Out: 2000 [Urine:2000]  Physical Exam: Alert, appropriate No jvd Chest clear RRR-no murmur Abdomen benign No edema Neuro n ormal   Lab Results:  Recent Labs  05/03/13 0025 05/04/13 0455  NA 141 141  K 4.0 4.4  CL 101 105  CO2 28 26  GLUCOSE 108* 100*  BUN 33* 21  CREATININE 1.66* 1.47*  CALCIUM 9.5 8.7    Recent Labs  05/03/13 0025  AST 29  ALT 30  ALKPHOS 100  BILITOT 0.2*  PROT 6.4  ALBUMIN 3.5    Recent Labs  05/03/13 0025  05/03/13 2225 05/04/13 0455  WBC 4.8  < > 3.9* 3.2*  NEUTROABS 2.9  --   --   --   HGB 8.8*  < > 10.9* 11.0*  HCT 27.4*  < > 33.2* 32.9*  MCV 89.8  < > 87.8 87.0  PLT 249  < > 202 208  < > = values in this interval not displayed. Lab Results  Component Value Date   INR 1.47 05/04/2013   INR 3.89* 05/03/2013   INR 2.1 01/16/2010   No results found for this basename: CKTOTAL, CKMB, CKMBINDEX, TROPONINI,  in the last 72 hours No results found for this basename: TSH, T4TOTAL, FREET3, T3FREE, THYROIDAB,  in the last 72 hours No results found for this basename: VITAMINB12, FOLATE, FERRITIN, TIBC, IRON, RETICCTPCT,  in the last 72 hours  Studies/Results: No results found.   Assessment/Plan: 1. Diverticular bleed-clearing, advance diet 2. Atrial fibrillation--off coumadin due to bleed 3. htn- stable 4. Remote cva-stabble  Mobilize and advance diet   LOS: 2 days   [name]  A 05/04/2013, 12:08 PM

## 2013-05-04 NOTE — Progress Notes (Signed)
[facility] PROGRESS NOTE Subjective Pt passing dark stool slight BRB with wiping. Was able to access old records yesterday PM [employer] portal was back up. Pt had EGD and colonoscopy by Dr Benson Norway 7/11 showing only HH and sigmoid tics/hemorrhoids.  Objective: Vital signs in last 24 hours: Temp:  [97.3 F (36.3 C)-98.6 F (37 C)] 98.4 F (36.9 C) (01/06 0404) Pulse Rate:  [61-80] 75 (01/06 0404) Resp:  [16-20] 18 (01/06 0404) BP: (112-145)/(47-86) 126/86 mmHg (01/06 0404) SpO2:  [96 %-100 %] 99 % (01/06 0404) Last BM Date: 05/03/13  Intake/Output from previous day: 01/05 0701 - 01/06 0700 In: 1380 [P.O.:480; I.V.:250; Blood:650] Out: 1400 [Urine:1400] Intake/Output this shift:    PE: General--NAD  Abdomen--soft nontender  Lab Results:  Recent Labs  05/03/13 0025 05/03/13 0340 05/03/13 2225 05/04/13 0455  WBC 4.8 4.0 3.9* 3.2*  HGB 8.8* 7.4* 10.9* 11.0*  HCT 27.4* 22.7* 33.2* 32.9*  PLT 249 184 202 208   BMET  Recent Labs  05/03/13 0025 05/04/13 0455  NA 141 141  K 4.0 4.4  CL 101 105  CO2 28 26  CREATININE 1.66* 1.47*   LFT  Recent Labs  05/03/13 0025  PROT 6.4  AST 29  ALT 30  ALKPHOS 100  BILITOT 0.2*   PT/INR  Recent Labs  05/03/13 0025 05/04/13 0455  LABPROT 36.7* 17.4*  INR 3.89* 1.47   PANCREAS No results found for this basename: LIPASE,  in the last 72 hours       Studies/Results: No results found.  Medications: I have reviewed the patient's current medications.  Assessment/Plan: 1. GI Bleed. Probably diverticular. Had colon 3 years ago that was negative except tics and hemorrhoids. Don't feel needs to have repeated if she stops. Will continue clear liquids just in case and miralax for 1 more day.   Leonela Kivi JR,Tiona Ruane L 05/04/2013, 7:41 AM

## 2013-05-04 NOTE — Plan of Care (Signed)
Problem: Phase I Progression Outcomes Goal: Voiding-avoid urinary catheter unless indicated Outcome: Not Applicable Date Met:  91/98/02 No foley

## 2013-05-05 LAB — CBC
HCT: 34.3 % — ABNORMAL LOW (ref 36.0–46.0)
Hemoglobin: 10.9 g/dL — ABNORMAL LOW (ref 12.0–15.0)
MCH: 28.5 pg (ref 26.0–34.0)
MCHC: 31.8 g/dL (ref 30.0–36.0)
MCV: 89.6 fL (ref 78.0–100.0)
PLATELETS: 206 10*3/uL (ref 150–400)
RBC: 3.83 MIL/uL — ABNORMAL LOW (ref 3.87–5.11)
RDW: 14.6 % (ref 11.5–15.5)
WBC: 3.8 10*3/uL — AB (ref 4.0–10.5)

## 2013-05-05 LAB — BASIC METABOLIC PANEL
BUN: 23 mg/dL (ref 6–23)
CHLORIDE: 103 meq/L (ref 96–112)
CO2: 26 meq/L (ref 19–32)
Calcium: 8.7 mg/dL (ref 8.4–10.5)
Creatinine, Ser: 1.62 mg/dL — ABNORMAL HIGH (ref 0.50–1.10)
GFR calc non Af Amer: 27 mL/min — ABNORMAL LOW (ref >90)
GFR, EST AFRICAN AMERICAN: 32 mL/min — AB (ref >90)
Glucose, Bld: 117 mg/dL — ABNORMAL HIGH (ref 70–99)
Potassium: 4.4 mEq/L (ref 3.7–5.3)
Sodium: 140 mEq/L (ref 137–147)

## 2013-05-05 NOTE — Discharge Summary (Signed)
DISCHARGE SUMMARY  HEDDA CRUMBLEY  MR#: [id number]  DOB:11-29-1923  Date of Admission: 05/02/2013 Date of Discharge: 05/05/2013  Attending Physician:Taleya Whitcher A  Patient's PIR:JJOACZY,SAYTKZS A, MD  Consults:Treatment Team:  Winfield Cunas., MD  Discharge Diagnoses: Principal Problem:   GI bleed Active Problems:   ANEMIA   HYPERTENSION   Atrial fibrillation   Copd   Hypothyroidism   Remote lacunar infarct   PAF   CKD3  Discharge Medications:   Medication List    STOP taking these medications       warfarin 4 MG tablet  Commonly known as:  COUMADIN      TAKE these medications       bisoprolol 5 MG tablet  Commonly known as:  ZEBETA  Take 5 mg by mouth every morning.     CALCIUM 600 + D 600-200 MG-UNIT Tabs  Generic drug:  Calcium Carb-Cholecalciferol  Take 1 tablet by mouth 2 (two) times daily.     diltiazem 240 MG 24 hr capsule  Commonly known as:  CARDIZEM CD  Take 240 mg by mouth every evening.     diltiazem 240 MG 24 hr capsule  Commonly known as:  CARDIZEM CD  TAKE ONE CAPSULE BY MOUTH EVERY DAY     FERREX 150 150 MG capsule  Generic drug:  iron polysaccharides  Take 150 mg by mouth 2 (two) times daily.     furosemide 40 MG tablet  Commonly known as:  LASIX  Take 40 mg by mouth every morning.     levothyroxine 50 MCG tablet  Commonly known as:  SYNTHROID, LEVOTHROID  Take 50 mcg by mouth daily before breakfast.     Melatonin 3-10 MG Tabs  Take 1 tablet by mouth every evening.     nitroGLYCERIN 0.4 MG SL tablet  Commonly known as:  NITROSTAT  Place 0.4 mg under the tongue every 5 (five) minutes as needed.     potassium chloride SA 20 MEQ tablet  Commonly known as:  K-DUR,KLOR-CON  Take 20 mEq by mouth 2 (two) times daily.     simvastatin 20 MG tablet  Commonly known as:  ZOCOR  Take 20 mg by mouth 2 (two) times daily.     simvastatin 40 MG tablet  Commonly known as:  ZOCOR  TAKE 1 TABLET BY MOUTH EVERY EVENING     tiotropium 18 MCG inhalation capsule  Commonly known as:  SPIRIVA  Place 1 capsule (18 mcg total) into inhaler and inhale daily.        Hospital Procedures: No results found.  History of Present Illness:  [name] is an 80 year old white female with a history of atrial fibrillation on anticoagulation, diastolic dysfunction, and history of TIA who presented to the emergency department with blood leaking from her rectum. The patient states she was in her usual state of health when she was sitting on the couch last evening around 7 PM. She noticed a wet feeling and when she looked she had a large amount of blood that had leaked out of her rectum. She did feel weak and lightheaded propping her to come to the emergency department. Since that time, she has had repeated episodes of blood leaking from her rectum. She denies any abdominal pain or rectal pain. No recent change in bowel movements preceding this event. She does have a long-standing history of iron deficiency anemia and underwent an endoscopy by Dr. Benson Norway in 7/11 which showed mild gastritis. She does not recall having a  colonoscopy at that time or within the past 10-15 years. Most recent hemoglobin was 9.9 on 04/06/13. She has been taking iron at home. She has not noticed any blood in her stool prior to last evening. Hemoglobin in the emergency department was initially 8.8 and dropped to 7.4 subsequently. She was mildly orthostatic but vital signs are stable. She is being admitted for further management  Hospital Course: Patient was admitted to the medical service hydrated bowel rested and serial hemoglobins monitored. She did receive 2 units of packed red blood cells given active bright red blood per rectum, a hemoglobin drop to the 7 range and symptoms. Hemoglobin subsequently stabilized rectal bleeding  Stopped. GI did consulted did not feel as though endoscopic evaluation was reported as his picture was most compatible with a diverticular  bleed.  She remained stable hemodynamically. Regarding her Coumadin and anticoagulation it was felt that the risks outweighed the benefits and Coumadin has been held at this time to be further addressed as an outpatient. She had been on Coumadin because of a postulated thromboembolic event from underlying paroxysmal atrial fibrillation and a small embolic stroke. She was eating well prior to discharge and had no further blood. She's discharged further as an outpatient.  Day of Discharge Exam BP 123/61  Pulse 69  Temp(Src) 97.5 F (36.4 C) (Oral)  Resp 16  Ht 5' 2"  (1.575 m)  Wt 62.642 kg (138 lb 1.6 oz)  BMI 25.25 kg/m2  SpO2 95%  Physical Exam: General appearance: alert, cooperative and no distress Eyes: no scleral icterus Throat: oropharynx moist without erythema Resp: clear to auscultation bilaterally Cardio: regular rate and rhythm Extremities: no clubbing, cyanosis or edema Abdomen is benign. Neurologic exam is normal.  Discharge Labs:  Recent Labs  05/04/13 0455 05/05/13 0418  NA 141 140  K 4.4 4.4  CL 105 103  CO2 26 26  GLUCOSE 100* 117*  BUN 21 23  CREATININE 1.47* 1.62*  CALCIUM 8.7 8.7    Recent Labs  05/03/13 0025  AST 29  ALT 30  ALKPHOS 100  BILITOT 0.2*  PROT 6.4  ALBUMIN 3.5    Recent Labs  05/03/13 0025  05/04/13 0455 05/05/13 0418  WBC 4.8  < > 3.2* 3.8*  NEUTROABS 2.9  --   --   --   HGB 8.8*  < > 11.0* 10.9*  HCT 27.4*  < > 32.9* 34.3*  MCV 89.8  < > 87.0 89.6  PLT 249  < > 208 206  < > = values in this interval not displayed. No results found for this basename: CKTOTAL, CKMB, CKMBINDEX, TROPONINI,  in the last 72 hours No results found for this basename: TSH, T4TOTAL, FREET3, T3FREE, THYROIDAB,  in the last 72 hours No results found for this basename: VITAMINB12, FOLATE, FERRITIN, TIBC, IRON, RETICCTPCT,  in the last 72 hours  Discharge instructions:     Discharge Orders   Future Appointments Provider Department Dept Phone    12/21/2013 1:00 PM [facility] [place] [phone]   12/21/2013 2:00 PM Mal Misty, MD [facility] [phone]   Future Orders Complete By Expires   Diet - low sodium heart healthy  As directed    Increase activity slowly  As directed       Disposition: Home with daughter  Follow-up Appts: Follow-up with Dr. Reynaldo Minium at [facility] in 1 week.  Call for appointment.  Condition on Discharge: Improved and stable condition  Tests Needing Follow-up: CBC and be met within one week then further decisions regarding anticoagulation or antiplatelet therapy  Signed: Olan Kurek A 05/05/2013, 8:06 AM

## 2013-05-05 NOTE — Progress Notes (Signed)
Discharge summary sent to payer through [facility]  

## 2013-05-06 ENCOUNTER — Ambulatory Visit: Payer: Medicare Other | Admitting: Cardiology

## 2013-05-11 ENCOUNTER — Other Ambulatory Visit: Payer: Self-pay

## 2013-05-11 MED ORDER — DILTIAZEM HCL ER COATED BEADS 240 MG PO CP24: 240.0000 mg | ORAL_CAPSULE | Freq: Every evening | ORAL | Status: DC

## 2013-05-12 DIAGNOSIS — K625 Hemorrhage of anus and rectum: Secondary | ICD-10-CM

## 2013-05-12 HISTORY — DX: Hemorrhage of anus and rectum: K62.5

## 2013-05-17 ENCOUNTER — Ambulatory Visit (INDEPENDENT_AMBULATORY_CARE_PROVIDER_SITE_OTHER): Payer: Medicare Other | Admitting: Cardiology

## 2013-05-17 ENCOUNTER — Other Ambulatory Visit: Payer: Self-pay | Admitting: *Deleted

## 2013-05-17 ENCOUNTER — Encounter: Payer: Self-pay | Admitting: Cardiology

## 2013-05-17 VITALS — BP 114/62 | HR 74 | Ht 61.0 in | Wt 137.0 lb

## 2013-05-17 DIAGNOSIS — I5032 Chronic diastolic (congestive) heart failure: Secondary | ICD-10-CM

## 2013-05-17 DIAGNOSIS — I4891 Unspecified atrial fibrillation: Secondary | ICD-10-CM

## 2013-05-17 DIAGNOSIS — K922 Gastrointestinal hemorrhage, unspecified: Secondary | ICD-10-CM

## 2013-05-17 DIAGNOSIS — R0602 Shortness of breath: Secondary | ICD-10-CM

## 2013-05-17 DIAGNOSIS — I2510 Atherosclerotic heart disease of native coronary artery without angina pectoris: Secondary | ICD-10-CM

## 2013-05-17 DIAGNOSIS — I10 Essential (primary) hypertension: Secondary | ICD-10-CM

## 2013-05-17 NOTE — Patient Instructions (Signed)
Your physician recommends that you have  lab work today--BNP.  Your physician recommends that you keep your follow-up appointment on Thursday February 26,2015 at 11:15AM.

## 2013-05-17 NOTE — Progress Notes (Signed)
Patient ID: [name], female   DOB: [birth year], 80 y.o.   MRN: [id number] PCP: Dr. Reynaldo Minium   80 yo with history of paroxysmal atrial fibrillation, nonobstructive CAD, and chronic exertional dyspnea presents for cardiology followup. Her main cardiopulmonary problem over the last few years has been chronic exertional dyspnea. She has had quite significant shortness of breath for a number of years now.  Patient has had an extensive workup for dyspnea. She had a right and left heart cath in 2/11 that showed nonobstructive CAD as well as normal pulmonary artery pressure and normal wedge pressure. Echo showed normal LV systolic function. She had a V/Q scan in 7/11 with no evidence for PE. She never smoked. She was seen by pulmonology and it was thought that reflux may be a cause of her symptoms. She was started on bid Protonix with some improvement but really minimal. She was in our office in 3/12 for ETT-myoview as a pre-operative test before surgery for endometrial cancer. There was no evidence for ischemia or infarction on perfusion images or ECG but the patient's oxygen saturation dropped from 96% at rest to 83% at peak exertion. She then had a high-resolution chest CT showing no significant interstitial lung disease but there was evidence for emphysema.  PFTs showed normal spirometry but there was decreased DLCO. She saw pulmonology again and it was thought that she could have a component of emphysema from passive smoking. She also had overnight oximetry with significant desaturation and is on oxygen at night. I also started her on Lasix for a probable component of diastolic CHF.  Since last appointment, patient was admitted in 1/15 with painless hematochezia.  INR was 3.89.  She was transfused 2 units pRBCs.  Diverticular bleeding was suspected but no colonoscopy was done.  Coumadin was stopped.  She has had no further overt bleeding.  Hemoglobin has risen appropriately.    Patient continues to have  exertional dyspnea.  She thinks that it may be worse than in the past.  She is not very active.  She does get short of breath making her bed.  She had mild dyspnea walking into the office today.  She is wearing oxygen at night.   Labs (4/12): hgb 10.6, LDL 84, HDL 68, BNP 98 Labs (6/12): LDL 78, HDL 67 Labs (2/13): K 4.3, creatinine 1.58, LDL 59, HDL 69, BNP 103 Labs (1/15): HCT 27.4 => 34, K 4.4, creatinine 1.6  PMH:  1. Atrial fibrillation: Paroxysmal. She was on dronedarone briefly but it was stopped. She is on coumadin.  2. CHF: Diastolic. Echo (2/11) with mild LVH, EF 30-86%, PA systolic pressure 35 mmHg.  3. Carotid stenosis: right CEA in 2011.  4. Endometrial cancer s/p hysterectomy 4/12.  She had radiation after this.  5. HTN  6. Anemia  7. GERD/hiatal hernia  8. Hypothyroidism  9. TIA  10. CAD: LHC (2/11) with 40% LAD stenosis, 50% mCFX stenosis. ETT-myoview (3/12): 5:31 exercise, no ECG changes, oxygen saturation dropped from 96% at rest to 83% with exertion, no ischemia or infarction on perfusion images.  11. Dyspnea with exertion: Extensive workup in 2011. Echo and left heart cath as above. Right heart cath with mean PA pressure 23 mmHg and PCWP 11 mmHg. V/Q scan (7/11) low probability. PFTs (2/11) normal except for mildly decreased DLCO. ETT-myoview as above. O2 sats dropped with exercise. Patient was evaluated by pulmonology, thought to have GERD as cause of dyspnea.  CT chest without contrast (high resolution) in 4/12  showed emphysema but no interstitial lung disease.  PFTs (4/12) with normal spirometry but decreased DLCO.  Nocturnal oximetry showed desaturation and she is now on oxygen at night.  Cardiopulmonary exercise test suggested a component of diastolic dysfunction.  Dyspnea thought to be multifactorial with emphysema, deconditioning, and diastolic CHF thought to play a role.  12. Pulmonary nodule: Followed by Dr Chase Caller  SH: Married, never smoked, retired. Lives in  [place]. Daughter is involved in her care.   FH: CAD   ROS: All systems reviewed and negative except as per HPI.   Current Outpatient Prescriptions  Medication Sig Dispense Refill  . bisoprolol (ZEBETA) 5 MG tablet Take 5 mg by mouth every morning.      . Calcium Carb-Cholecalciferol (CALCIUM 600 + D) 600-200 MG-UNIT TABS Take 1 tablet by mouth 2 (two) times daily.      Marland Kitchen diltiazem (CARDIZEM CD) 240 MG 24 hr capsule Take 1 capsule (240 mg total) by mouth every evening.  30 capsule  1  . furosemide (LASIX) 40 MG tablet Take 40 mg by mouth every morning.      . iron polysaccharides (FERREX 150) 150 MG capsule Take 150 mg by mouth 2 (two) times daily.      Marland Kitchen levothyroxine (SYNTHROID, LEVOTHROID) 50 MCG tablet Take 50 mcg by mouth daily before breakfast.      . Melatonin 3-10 MG TABS Take 1 tablet by mouth every evening.        . nitroGLYCERIN (NITROSTAT) 0.4 MG SL tablet Place 0.4 mg under the tongue every 5 (five) minutes as needed.        . potassium chloride SA (K-DUR,KLOR-CON) 20 MEQ tablet Take 20 mEq by mouth 2 (two) times daily.      . simvastatin (ZOCOR) 40 MG tablet Take 40 mg by mouth daily.       Marland Kitchen tiotropium (SPIRIVA) 18 MCG inhalation capsule Place 1 capsule (18 mcg total) into inhaler and inhale daily.  40 capsule  0   No current facility-administered medications for this visit.    BP 114/62  Pulse 74  Ht 5\' 1"  (1.549 m)  Wt 62.143 kg (137 lb)  BMI 25.90 kg/m2 BP 135/65  HR 60 General: NAD  Neck: JVP not elevated, no thyromegaly or thyroid nodule.  Lungs: Clear to auscultation bilaterally with normal respiratory effort.  CV: Nondisplaced PMI. Heart regular S1/S2, no S3/S4, no murmur. No peripheral edema. No carotid bruit. Normal pedal pulses.  Abdomen: Soft, nontender, no hepatosplenomegaly, no distention.  Neurologic: Alert and oriented x 3.  Psych: Normal affect.  Extremities: No clubbing or cyanosis.   Assessment/Plan:  DYSPNEA Multifactorial and chronic but  I suspect that the significant contributors are diastolic CHF, COPD, and nocturnal desaturation. She has done better on Lasix, bronchodilators, and oxygen at night. Cardiopulmonary exercise test in 2012 suggested that diastolic dysfunction is present. She does not appear volume overloaded on exam today. I will check a BNP today. Continue current Lasix dose for now unless there is a significant BNP elevation.  Coronary artery disease Nonobstructive on last cath. Continue ASA 81 mg daily and statin.  Carotid stenosis S/p right CEA, follows with [facility].  ATRIAL FIBRILLATION  Paroxysmal. She is in sinus rhythm now. No recent atrial fibrillation-type symptoms. She is on diltiazem CD but stopped coumadin due to recent lower GI bleed.  She has a history of TIA.  No further bleeding. - Would wait for 3-4 more weeks to allow healing.  If no further bleeding  and CBC remains stable, would consider starting low dose apixaban (2.5 mg bid).  I will see her back in the office in 1 month to discuss this.  HYPERTENSION   Blood pressure is reasonably controlled.  Hyperlipidemia Goal LDL < 70 with known vascular disease.  She is on simvastatin.   Lower GI Bleed Suspect diverticular. Follow CBC.    Loralie Champagne 05/17/2013

## 2013-05-18 LAB — BRAIN NATRIURETIC PEPTIDE: PRO B NATRI PEPTIDE: 139 pg/mL — AB (ref 0.0–100.0)

## 2013-05-19 ENCOUNTER — Other Ambulatory Visit: Payer: Self-pay | Admitting: *Deleted

## 2013-05-19 DIAGNOSIS — R0602 Shortness of breath: Secondary | ICD-10-CM

## 2013-05-19 DIAGNOSIS — I4891 Unspecified atrial fibrillation: Secondary | ICD-10-CM

## 2013-05-19 MED ORDER — FUROSEMIDE 40 MG PO TABS
ORAL_TABLET | ORAL | Status: DC
Start: 2013-05-19 — End: 2013-08-30

## 2013-05-25 ENCOUNTER — Telehealth: Payer: Self-pay | Admitting: Cardiology

## 2013-05-25 NOTE — Telephone Encounter (Signed)
New Problem:  Pt's daughter  is asking if her mom needs to hold any of her medicine prior to her blood work on Friday.

## 2013-05-25 NOTE — Telephone Encounter (Signed)
Spoke with patient's daughter.

## 2013-05-28 ENCOUNTER — Other Ambulatory Visit (INDEPENDENT_AMBULATORY_CARE_PROVIDER_SITE_OTHER): Payer: Medicare Other

## 2013-05-28 DIAGNOSIS — I4891 Unspecified atrial fibrillation: Secondary | ICD-10-CM

## 2013-05-28 DIAGNOSIS — R0602 Shortness of breath: Secondary | ICD-10-CM

## 2013-05-28 LAB — BASIC METABOLIC PANEL
BUN: 28 mg/dL — ABNORMAL HIGH (ref 6–23)
CHLORIDE: 103 meq/L (ref 96–112)
CO2: 31 mEq/L (ref 19–32)
CREATININE: 1.6 mg/dL — AB (ref 0.4–1.2)
Calcium: 9.1 mg/dL (ref 8.4–10.5)
GFR: 31.47 mL/min — ABNORMAL LOW (ref >60.00)
Glucose, Bld: 126 mg/dL — ABNORMAL HIGH (ref 70–99)
POTASSIUM: 4 meq/L (ref 3.5–5.1)
Sodium: 138 mEq/L (ref 135–145)

## 2013-05-28 LAB — BRAIN NATRIURETIC PEPTIDE: PRO B NATRI PEPTIDE: 149 pg/mL — AB (ref 0.0–100.0)

## 2013-06-06 ENCOUNTER — Other Ambulatory Visit: Payer: Self-pay | Admitting: Cardiology

## 2013-06-17 ENCOUNTER — Other Ambulatory Visit: Payer: Self-pay | Admitting: Vascular Surgery

## 2013-06-17 DIAGNOSIS — I6529 Occlusion and stenosis of unspecified carotid artery: Secondary | ICD-10-CM

## 2013-06-17 DIAGNOSIS — Z48812 Encounter for surgical aftercare following surgery on the circulatory system: Secondary | ICD-10-CM

## 2013-06-24 ENCOUNTER — Ambulatory Visit: Payer: Medicare Other | Admitting: Cardiology

## 2013-06-27 ENCOUNTER — Other Ambulatory Visit: Payer: Self-pay | Admitting: Cardiology

## 2013-07-01 ENCOUNTER — Telehealth: Payer: Self-pay | Admitting: Cardiology

## 2013-07-01 NOTE — Telephone Encounter (Deleted)
Error

## 2013-07-04 ENCOUNTER — Other Ambulatory Visit: Payer: Self-pay | Admitting: Cardiology

## 2013-07-06 ENCOUNTER — Encounter: Payer: Self-pay | Admitting: Cardiology

## 2013-07-06 ENCOUNTER — Ambulatory Visit (INDEPENDENT_AMBULATORY_CARE_PROVIDER_SITE_OTHER): Payer: Medicare Other | Admitting: Cardiology

## 2013-07-06 ENCOUNTER — Ambulatory Visit: Payer: Medicare Other | Admitting: Nurse Practitioner

## 2013-07-06 VITALS — BP 118/64 | Ht 61.0 in | Wt 132.0 lb

## 2013-07-06 DIAGNOSIS — I4891 Unspecified atrial fibrillation: Secondary | ICD-10-CM

## 2013-07-06 DIAGNOSIS — K922 Gastrointestinal hemorrhage, unspecified: Secondary | ICD-10-CM

## 2013-07-06 DIAGNOSIS — I2510 Atherosclerotic heart disease of native coronary artery without angina pectoris: Secondary | ICD-10-CM

## 2013-07-06 DIAGNOSIS — I6529 Occlusion and stenosis of unspecified carotid artery: Secondary | ICD-10-CM

## 2013-07-06 DIAGNOSIS — R0602 Shortness of breath: Secondary | ICD-10-CM

## 2013-07-06 DIAGNOSIS — I10 Essential (primary) hypertension: Secondary | ICD-10-CM

## 2013-07-06 DIAGNOSIS — I5032 Chronic diastolic (congestive) heart failure: Secondary | ICD-10-CM

## 2013-07-06 LAB — CBC WITH DIFFERENTIAL/PLATELET
BASOS PCT: 0.7 % (ref 0.0–3.0)
Basophils Absolute: 0 10*3/uL (ref 0.0–0.1)
EOS ABS: 0.3 10*3/uL (ref 0.0–0.7)
EOS PCT: 7.7 % — AB (ref 0.0–5.0)
HCT: 30.7 % — ABNORMAL LOW (ref 36.0–46.0)
HEMOGLOBIN: 10.3 g/dL — AB (ref 12.0–15.0)
LYMPHS PCT: 17.1 % (ref 12.0–46.0)
Lymphs Abs: 0.6 10*3/uL — ABNORMAL LOW (ref 0.7–4.0)
MCHC: 33.5 g/dL (ref 30.0–36.0)
MCV: 87.2 fl (ref 78.0–100.0)
Monocytes Absolute: 0.5 10*3/uL (ref 0.1–1.0)
Monocytes Relative: 13.7 % — ABNORMAL HIGH (ref 3.0–12.0)
NEUTROS ABS: 2.2 10*3/uL (ref 1.4–7.7)
Neutrophils Relative %: 60.8 % (ref 43.0–77.0)
Platelets: 175 10*3/uL (ref 150.0–400.0)
RBC: 3.52 Mil/uL — AB (ref 3.87–5.11)
RDW: 14.5 % (ref 11.5–14.6)
WBC: 3.6 10*3/uL — ABNORMAL LOW (ref 4.5–10.5)

## 2013-07-06 LAB — BASIC METABOLIC PANEL
BUN: 29 mg/dL — ABNORMAL HIGH (ref 6–23)
CALCIUM: 9.3 mg/dL (ref 8.4–10.5)
CHLORIDE: 105 meq/L (ref 96–112)
CO2: 30 mEq/L (ref 19–32)
Creatinine, Ser: 1.7 mg/dL — ABNORMAL HIGH (ref 0.4–1.2)
GFR: 30.81 mL/min — ABNORMAL LOW (ref >60.00)
Glucose, Bld: 106 mg/dL — ABNORMAL HIGH (ref 70–99)
Potassium: 4.1 mEq/L (ref 3.5–5.1)
SODIUM: 142 meq/L (ref 135–145)

## 2013-07-06 MED ORDER — APIXABAN 2.5 MG PO TABS: 2.5000 mg | ORAL_TABLET | Freq: Two times a day (BID) | ORAL | Status: DC

## 2013-07-06 NOTE — Patient Instructions (Signed)
Start Eliquis 2.5mg  two times a day.  Your physician recommends that you have  Lab today--BMET/CBCd  Your physician recommends that you schedule a follow-up appointment in: 4 months with Dr Aundra Dubin.

## 2013-07-06 NOTE — Progress Notes (Signed)
Patient ID: [name], female   DOB: [birth year], 80 y.o.   MRN: [id number] PCP: Dr. Reynaldo Minium   80 yo with history of paroxysmal atrial fibrillation, nonobstructive CAD, and chronic exertional dyspnea presents for cardiology followup. Her main cardiopulmonary problem over the last few years has been chronic exertional dyspnea. She has had quite significant shortness of breath for a number of years now.  Patient has had an extensive workup for dyspnea. She had a right and left heart cath in 2/11 that showed nonobstructive CAD as well as normal pulmonary artery pressure and normal wedge pressure. Echo showed normal LV systolic function. She had a V/Q scan in 7/11 with no evidence for PE. She never smoked. She was seen by pulmonology and it was thought that reflux may be a cause of her symptoms. She was started on bid Protonix with some improvement but really minimal. She was in our office in 3/12 for ETT-myoview as a pre-operative test before surgery for endometrial cancer. There was no evidence for ischemia or infarction on perfusion images or ECG but the patient's oxygen saturation dropped from 96% at rest to 83% at peak exertion. She then had a high-resolution chest CT showing no significant interstitial lung disease but there was evidence for emphysema.  PFTs showed normal spirometry but there was decreased DLCO. She saw pulmonology again and it was thought that she could have a component of emphysema from passive smoking. She also had overnight oximetry with significant desaturation and is on oxygen at night. I also started her on Lasix for a probable component of diastolic CHF.  Patient was admitted in 1/15 with painless hematochezia.  INR was 3.89.  She was transfused 2 units pRBCs.  Diverticular bleeding was suspected but no colonoscopy was done.  Coumadin was stopped. She has had no further overt bleeding.     Patient continues to have exertional dyspnea.  It is chronic and no worse than in the  past.  She is not very active.  She does get short of breath making her bed.  She walks in her house and in her yard without significant dyspnea.  She is wearing oxygen at night.  NSR today, no tachypalpitations.   Labs (4/12): hgb 10.6, LDL 84, HDL 68, BNP 98 Labs (6/12): LDL 78, HDL 67 Labs (2/13): K 4.3, creatinine 1.58, LDL 59, HDL 69, BNP 103 Labs (1/15): HCT 27.4 => 34, K 4.4, creatinine 1.6, BNP 149  ECG: NSR, PVC, inferior T wave inversions.   PMH:  1. Atrial fibrillation: Paroxysmal. She was on dronedarone briefly but it was stopped. She stopped coumadin after GI bleed. 2. CHF: Diastolic. Echo (2/11) with mild LVH, EF 93-23%, PA systolic pressure 35 mmHg.  3. Carotid stenosis: right CEA in 2011.  4. Endometrial cancer s/p hysterectomy 4/12.  She had radiation after this.  5. HTN  6. Anemia  7. GERD/hiatal hernia  8. Hypothyroidism  9. TIA  10. CAD: LHC (2/11) with 40% LAD stenosis, 50% mCFX stenosis. ETT-myoview (3/12): 5:31 exercise, no ECG changes, oxygen saturation dropped from 96% at rest to 83% with exertion, no ischemia or infarction on perfusion images.  11. Dyspnea with exertion: Extensive workup in 2011. Echo and left heart cath as above. Right heart cath with mean PA pressure 23 mmHg and PCWP 11 mmHg. V/Q scan (7/11) low probability. PFTs (2/11) normal except for mildly decreased DLCO. ETT-myoview as above. O2 sats dropped with exercise. Patient was evaluated by pulmonology, thought to have GERD as cause  of dyspnea.  CT chest without contrast (high resolution) in 4/12 showed emphysema but no interstitial lung disease.  PFTs (4/12) with normal spirometry but decreased DLCO.  Nocturnal oximetry showed desaturation and she is now on oxygen at night.  Cardiopulmonary exercise test suggested a component of diastolic dysfunction.  Dyspnea thought to be multifactorial with emphysema, deconditioning, and diastolic CHF thought to play a role.  12. Pulmonary nodule: Followed by Dr  Chase Caller 13. Lower GI bleed (1/15): Suspect diverticular.   SH: Married, never smoked, retired. Lives in [place]. Daughter is involved in her care.   FH: CAD   ROS: All systems reviewed and negative except as per HPI.   Current Outpatient Prescriptions  Medication Sig Dispense Refill  . bisoprolol (ZEBETA) 5 MG tablet Take 5 mg by mouth every morning.      . Calcium Carb-Cholecalciferol (CALCIUM 600 + D) 600-200 MG-UNIT TABS Take 1 tablet by mouth 2 (two) times daily.      Marland Kitchen diltiazem (CARDIZEM CD) 240 MG 24 hr capsule Take 1 capsule (240 mg total) by mouth every evening.  30 capsule  1  . furosemide (LASIX) 40 MG tablet 1 tablet (40mg ) in the AM, 1/2 tablet (20mg ) in the PM  45 tablet  3  . iron polysaccharides (FERREX 150) 150 MG capsule Take 150 mg by mouth 2 (two) times daily.      Marland Kitchen KLOR-CON M20 20 MEQ tablet TAKE 1 TABLET BY MOUTH TWICE A DAY  60 tablet  0  . levothyroxine (SYNTHROID, LEVOTHROID) 50 MCG tablet Take 50 mcg by mouth daily before breakfast.      . Melatonin 3-10 MG TABS Take 1 tablet by mouth every evening.        . nitroGLYCERIN (NITROSTAT) 0.4 MG SL tablet Place 0.4 mg under the tongue every 5 (five) minutes as needed.        . potassium chloride SA (K-DUR,KLOR-CON) 20 MEQ tablet Take 20 mEq by mouth 2 (two) times daily.      . simvastatin (ZOCOR) 40 MG tablet TAKE 1 TABLET BY MOUTH EVERY EVENING  30 tablet  0  . tiotropium (SPIRIVA) 18 MCG inhalation capsule Place 1 capsule (18 mcg total) into inhaler and inhale daily.  40 capsule  0  . apixaban (ELIQUIS) 2.5 MG TABS tablet Take 1 tablet (2.5 mg total) by mouth 2 (two) times daily.  60 tablet  4   No current facility-administered medications for this visit.    BP 118/64  Ht 5\' 1"  (1.549 m)  Wt 59.875 kg (132 lb)  BMI 24.95 kg/m2 BP 135/65  HR 60 General: NAD  Neck: JVP not elevated, no thyromegaly or thyroid nodule.  Lungs: Clear to auscultation bilaterally with normal respiratory effort.  CV:  Nondisplaced PMI. Heart regular S1/S2, no S3/S4, no murmur. No peripheral edema. No carotid bruit. Normal pedal pulses.  Abdomen: Soft, nontender, no hepatosplenomegaly, no distention.  Neurologic: Alert and oriented x 3.  Psych: Normal affect.  Extremities: No clubbing or cyanosis.   Assessment/Plan:  DYSPNEA Multifactorial and chronic but I suspect that the significant contributors are diastolic CHF, COPD, and nocturnal desaturation. She has done better on Lasix, bronchodilators, and oxygen at night. Cardiopulmonary exercise test in 2012 suggested that diastolic dysfunction is present. She does not appear volume overloaded on exam today.  Continue current Lasix dose.  Coronary artery disease Nonobstructive on last cath. Continue ASA 81 mg daily and statin.  Carotid stenosis S/p right CEA, follows with [facility].  ATRIAL  FIBRILLATION  Paroxysmal. She is in sinus rhythm now. No recent atrial fibrillation-type symptoms. She is on diltiazem CD but stopped coumadin due to recent lower GI bleed in setting of high INR.  She has a history of TIA.  No further bleeding.  - I am going to start her on apixaban 2.5 mg bid (reduced dose with age, size, and creatinine > 1.5). - Check CBC and BMET today.  HYPERTENSION   Blood pressure is reasonably controlled.  Hyperlipidemia Goal LDL < 70 with known vascular disease.  She is on simvastatin.   Lower GI Bleed Suspect diverticular. Follow CBC.  As above, starting reduced-dose apixaban.  Will need to follow for signs/symptoms of GI bleeding.    Loralie Champagne 07/06/2013

## 2013-07-09 ENCOUNTER — Telehealth: Payer: Self-pay | Admitting: *Deleted

## 2013-07-09 NOTE — Telephone Encounter (Signed)
During call with lab results, daughter reported that on Wednesday 3/11 patient had 2 different episodes of rectal bleeding.  First was < quarter sized clot, dark red color. Second was smaller, and bright red.     Daughter spoke with PCP who advised to stop eliquis for now and see if she has further episodes.  She has not.  She only took [place] 2.5 MG ON Wednesday AM.  Instructed her that I will forward to Dr. Aundra Dubin to advise.

## 2013-07-09 NOTE — Telephone Encounter (Signed)
Informed patients daughter, Vivien Rota of Dr. Claris Gladden note.  Verbalizes understanding.  She sets up patients pill box.

## 2013-07-09 NOTE — Telephone Encounter (Signed)
She is going to need to stay off anticoagulation for the time being, it looks like.  She just had a diverticular bleed in January.

## 2013-07-16 ENCOUNTER — Other Ambulatory Visit: Payer: Self-pay | Admitting: Internal Medicine

## 2013-08-01 ENCOUNTER — Other Ambulatory Visit: Payer: Self-pay | Admitting: Cardiology

## 2013-08-02 ENCOUNTER — Other Ambulatory Visit: Payer: Self-pay | Admitting: *Deleted

## 2013-08-02 MED ORDER — DILTIAZEM HCL ER COATED BEADS 240 MG PO CP24: 240.0000 mg | ORAL_CAPSULE | Freq: Every evening | ORAL | Status: DC

## 2013-08-03 ENCOUNTER — Other Ambulatory Visit: Payer: Self-pay | Admitting: *Deleted

## 2013-08-03 ENCOUNTER — Encounter: Payer: Self-pay | Admitting: *Deleted

## 2013-08-19 ENCOUNTER — Telehealth: Payer: Self-pay | Admitting: Internal Medicine

## 2013-08-19 NOTE — Telephone Encounter (Signed)
Called and spoke with pts daughter and she stated that the pt is having some SOB with activity.  She stated that the pts husband is having some medical issues and they feel that the pt is anxious about this.  They were requesting that the pt use the spiriva bid but i advised the pts daughter that the spiriva can only be used once daily.   MR please advise if any other recs for the pt.    Allergies  Allergen Reactions  . Albuterol Other (See Comments) and Swelling    Pt states she was shaking  devloped tremors, swelling during PFT following albuterol     Current Outpatient Prescriptions on File Prior to Visit  Medication Sig Dispense Refill  . apixaban (ELIQUIS) 2.5 MG TABS tablet Take 1 tablet (2.5 mg total) by mouth 2 (two) times daily.  60 tablet  4  . bisoprolol (ZEBETA) 5 MG tablet Take 5 mg by mouth every morning.      . Calcium Carb-Cholecalciferol (CALCIUM 600 + D) 600-200 MG-UNIT TABS Take 1 tablet by mouth 2 (two) times daily.      Marland Kitchen diltiazem (CARDIZEM CD) 240 MG 24 hr capsule Take 1 capsule (240 mg total) by mouth every evening.  30 capsule  3  . furosemide (LASIX) 40 MG tablet 1 tablet (40mg ) in the AM, 1/2 tablet (20mg ) in the PM  45 tablet  3  . iron polysaccharides (FERREX 150) 150 MG capsule Take 150 mg by mouth 2 (two) times daily.      Marland Kitchen KLOR-CON M20 20 MEQ tablet TAKE 1 TABLET BY MOUTH TWICE A DAY  60 tablet  5  . levothyroxine (SYNTHROID, LEVOTHROID) 50 MCG tablet Take 50 mcg by mouth daily before breakfast.      . Melatonin 3-10 MG TABS Take 1 tablet by mouth every evening.        . nitroGLYCERIN (NITROSTAT) 0.4 MG SL tablet Place 0.4 mg under the tongue every 5 (five) minutes as needed.        . potassium chloride SA (K-DUR,KLOR-CON) 20 MEQ tablet Take 20 mEq by mouth 2 (two) times daily.      . simvastatin (ZOCOR) 40 MG tablet TAKE 1 TABLET BY MOUTH EVERY EVENING  30 tablet  5  . SPIRIVA HANDIHALER 18 MCG inhalation capsule PLACE 1 CAPSULE INTO INHALER AND INHALE DAILY.   30 capsule  6  . tiotropium (SPIRIVA) 18 MCG inhalation capsule Place 1 capsule (18 mcg total) into inhaler and inhale daily.  40 capsule  0   No current facility-administered medications on file prior to visit.

## 2013-08-20 MED ORDER — TIOTROPIUM BROMIDE MONOHYDRATE 2.5 MCG/ACT IN AERS: 2.0000 | INHALATION_SPRAY | Freq: Every day | RESPIRATORY_TRACT | Status: DC

## 2013-08-20 NOTE — Telephone Encounter (Signed)
We did not have any samples of tudorza. Spoke with MR and he reports to give pt spiriva respimat i called daughter back at correct # [phone]. Made her aware and will leave samples for pick up. She will need to be shown how to use this

## 2013-08-20 NOTE — Telephone Encounter (Signed)
They are welcome to come and get few/several samples of tudorza bid for this short period and trial it

## 2013-08-23 NOTE — Telephone Encounter (Signed)
Patrick Jupiter came by to pick up samples for patient-I showed him how to load the medication and how to use the medication. Patrick Jupiter understands if any questions or concerns about med or how to use it to please call the office and any nurse can instruct the patient on how to use the medication. Nothing more needed at this time.

## 2013-08-25 ENCOUNTER — Other Ambulatory Visit (HOSPITAL_COMMUNITY): Payer: Medicare Other | Admitting: Internal Medicine

## 2013-08-25 ENCOUNTER — Ambulatory Visit (HOSPITAL_COMMUNITY)
Admission: RE | Admit: 2013-08-25 | Discharge: 2013-08-25 | Disposition: A | Discharge status: Home / Self Care | Payer: Medicare Other | Source: Ambulatory Visit | Attending: Internal Medicine | Admitting: Internal Medicine

## 2013-08-25 ENCOUNTER — Ambulatory Visit (HOSPITAL_COMMUNITY): Payer: Medicare Other | Attending: Internal Medicine

## 2013-08-25 ENCOUNTER — Encounter: Payer: Self-pay | Admitting: Physician Assistant

## 2013-08-25 DIAGNOSIS — D649 Anemia, unspecified: Secondary | ICD-10-CM | POA: Diagnosis present

## 2013-08-26 ENCOUNTER — Ambulatory Visit (HOSPITAL_COMMUNITY)
Admission: RE | Admit: 2013-08-26 | Discharge: 2013-08-26 | Disposition: A | Discharge status: Home / Self Care | Payer: Medicare Other | Source: Ambulatory Visit | Attending: Internal Medicine | Admitting: Internal Medicine

## 2013-08-26 ENCOUNTER — Other Ambulatory Visit (HOSPITAL_COMMUNITY): Payer: Self-pay | Admitting: Internal Medicine

## 2013-08-26 ENCOUNTER — Encounter (HOSPITAL_COMMUNITY): Payer: Self-pay

## 2013-08-26 DIAGNOSIS — Z9289 Personal history of other medical treatment: Secondary | ICD-10-CM

## 2013-08-26 DIAGNOSIS — Z8673 Personal history of transient ischemic attack (TIA), and cerebral infarction without residual deficits: Secondary | ICD-10-CM | POA: Insufficient documentation

## 2013-08-26 DIAGNOSIS — I2510 Atherosclerotic heart disease of native coronary artery without angina pectoris: Secondary | ICD-10-CM | POA: Insufficient documentation

## 2013-08-26 DIAGNOSIS — J449 Chronic obstructive pulmonary disease, unspecified: Secondary | ICD-10-CM | POA: Insufficient documentation

## 2013-08-26 DIAGNOSIS — Z7901 Long term (current) use of anticoagulants: Secondary | ICD-10-CM | POA: Insufficient documentation

## 2013-08-26 DIAGNOSIS — D500 Iron deficiency anemia secondary to blood loss (chronic): Secondary | ICD-10-CM | POA: Insufficient documentation

## 2013-08-26 DIAGNOSIS — J4489 Other specified chronic obstructive pulmonary disease: Secondary | ICD-10-CM | POA: Insufficient documentation

## 2013-08-26 DIAGNOSIS — I509 Heart failure, unspecified: Secondary | ICD-10-CM | POA: Insufficient documentation

## 2013-08-26 DIAGNOSIS — K625 Hemorrhage of anus and rectum: Secondary | ICD-10-CM | POA: Insufficient documentation

## 2013-08-26 DIAGNOSIS — K5730 Diverticulosis of large intestine without perforation or abscess without bleeding: Secondary | ICD-10-CM | POA: Insufficient documentation

## 2013-08-26 DIAGNOSIS — I10 Essential (primary) hypertension: Secondary | ICD-10-CM | POA: Insufficient documentation

## 2013-08-26 DIAGNOSIS — Z9981 Dependence on supplemental oxygen: Secondary | ICD-10-CM | POA: Insufficient documentation

## 2013-08-26 DIAGNOSIS — Z79899 Other long term (current) drug therapy: Secondary | ICD-10-CM | POA: Insufficient documentation

## 2013-08-26 DIAGNOSIS — D649 Anemia, unspecified: Secondary | ICD-10-CM | POA: Diagnosis not present

## 2013-08-26 HISTORY — DX: Personal history of other medical treatment: Z92.89

## 2013-08-26 LAB — PREPARE RBC (CROSSMATCH)

## 2013-08-26 MED ORDER — SODIUM CHLORIDE 0.9 % IV SOLN
Freq: Once | INTRAVENOUS | Status: AC
  Administered 2013-08-26: 09:00:00 via INTRAVENOUS

## 2013-08-26 MED ORDER — FUROSEMIDE 10 MG/ML IJ SOLN
40.0000 mg | Freq: Once | INTRAMUSCULAR | Status: AC
  Administered 2013-08-26: 40 mg via INTRAVENOUS
  Filled 2013-08-26: qty 4

## 2013-08-26 NOTE — Discharge Instructions (Signed)
° ° ° ° °  Blood Transfusion  A blood transfusion replaces your blood or some of its parts. Blood is replaced when you have lost blood because of surgery, an accident, or for severe blood conditions like anemia. You can donate blood to be used on yourself if you have a planned surgery. If you lose blood during that surgery, your own blood can be given back to you. Any blood given to you is checked to make sure it matches your blood type. Your temperature, blood pressure, and heart rate (vital signs) will be checked often.  GET HELP RIGHT AWAY IF:   You feel sick to your stomach (nauseous) or throw up (vomit).  You have watery poop (diarrhea).  You have shortness of breath or trouble breathing.  You have blood in your pee (urine) or have dark colored pee.  You have chest pain or tightness.  Your eyes or skin turn yellow (jaundice).  You have a temperature by mouth above 102 F (38.9 C), not controlled by medicine.  You start to shake and have chills.  You develop a a red rash (hives) or feel itchy.  You develop lightheadedness or feel confused.  You develop back, joint, or muscle pain.  You do not feel hungry (lost appetite).  You feel tired, restless, or nervous.  You develop belly (abdominal) cramps. Document Released: 07/12/2008 Document Revised: 07/08/2011 Document Reviewed: 07/12/2008 [employer] Patient Information 2014 [place], [place]. Iron Deficiency Anemia, Adult Anemia is when you have a low number of healthy red blood cells. It is often caused by too little iron. This is called iron deficiency anemia. It may make you tired and short of breath. HOME CARE   Take iron as told by your doctor.  Take vitamins as told by your doctor.  Eat foods that have iron in them. This includes liver, lean beef, whole-grain bread, eggs, dried fruit, and dark green leafy vegetables. GET HELP RIGHT AWAY IF:  You pass out (faint).  You have chest pain.  You feel sick to your stomach  (nauseous) or throw up (vomit).  You get very short of breath with activity.  You are weak.  You have a fast heartbeat.  You start to sweat for no reason.  You become lightheaded when getting up from a chair or bed. MAKE SURE YOU:  Understand these instructions.  Will watch your condition.  Will get help right away if you are not doing well or get worse. Document Released: 05/18/2010 Document Revised: 02/03/2013 Document Reviewed: 12/21/2012 [facility] Patient Information 2014 [place].

## 2013-08-27 LAB — TYPE AND SCREEN
ABO/RH(D): A POS
Antibody Screen: NEGATIVE
UNIT DIVISION: 0
Unit division: 0

## 2013-08-30 ENCOUNTER — Other Ambulatory Visit (INDEPENDENT_AMBULATORY_CARE_PROVIDER_SITE_OTHER): Payer: Medicare Other

## 2013-08-30 ENCOUNTER — Ambulatory Visit (INDEPENDENT_AMBULATORY_CARE_PROVIDER_SITE_OTHER): Payer: Medicare Other | Admitting: Physician Assistant

## 2013-08-30 ENCOUNTER — Other Ambulatory Visit: Payer: Self-pay | Admitting: *Deleted

## 2013-08-30 ENCOUNTER — Encounter (HOSPITAL_COMMUNITY): Payer: Self-pay | Admitting: *Deleted

## 2013-08-30 ENCOUNTER — Encounter (HOSPITAL_COMMUNITY): Payer: Self-pay | Admitting: Pharmacy Technician

## 2013-08-30 ENCOUNTER — Encounter: Payer: Self-pay | Admitting: Physician Assistant

## 2013-08-30 VITALS — BP 108/60 | HR 72 | Ht 61.0 in | Wt 129.8 lb

## 2013-08-30 DIAGNOSIS — K922 Gastrointestinal hemorrhage, unspecified: Secondary | ICD-10-CM

## 2013-08-30 DIAGNOSIS — D649 Anemia, unspecified: Secondary | ICD-10-CM

## 2013-08-30 LAB — CBC WITH DIFFERENTIAL/PLATELET
BASOS ABS: 0 10*3/uL (ref 0.0–0.1)
Basophils Relative: 0.3 % (ref 0.0–3.0)
Eosinophils Absolute: 0.2 10*3/uL (ref 0.0–0.7)
Eosinophils Relative: 3.5 % (ref 0.0–5.0)
HCT: 30.1 % — ABNORMAL LOW (ref 36.0–46.0)
Hemoglobin: 10.1 g/dL — ABNORMAL LOW (ref 12.0–15.0)
Lymphocytes Relative: 16 % (ref 12.0–46.0)
Lymphs Abs: 0.7 10*3/uL (ref 0.7–4.0)
MCHC: 33.4 g/dL (ref 30.0–36.0)
MCV: 89.2 fl (ref 78.0–100.0)
MONOS PCT: 11.5 % (ref 3.0–12.0)
Monocytes Absolute: 0.5 10*3/uL (ref 0.1–1.0)
NEUTROS PCT: 68.7 % (ref 43.0–77.0)
Neutro Abs: 3 10*3/uL (ref 1.4–7.7)
PLATELETS: 255 10*3/uL (ref 150.0–400.0)
RBC: 3.38 Mil/uL — ABNORMAL LOW (ref 3.87–5.11)
RDW: 14.3 % (ref 11.5–14.6)
WBC: 4.3 10*3/uL — AB (ref 4.5–10.5)

## 2013-08-30 NOTE — Progress Notes (Addendum)
Subjective:    Patient ID: [name], female    DOB: 06-09-1923, 80 y.o.   MRN: [id number]  HPI [name] is a pleasant 80 year old white female known remotely to [name] from a colonoscopy done in 2007 for anemia. She was noted to have diverticulosis from the sigmoid to be a ascending colon and otherwise negative exam. 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 She was on Coumadin until she was admitted in January 2015 with painless rectal bleeding. At that time her INR was supratherapeutic she required 3 units of packed rbc's and was felt to have had a suspected diverticular bleed though colonoscopy was not done. She was not seen by GI during that admission. However her anticoagulation was stopped and she has not been placed back on blood thinners since.  Patient and daughter relates that her hemoglobin had been in the 10 range but had drifted back down to 8 last week when she was seen a [name]'s office. Copies of her labs were faxed and on 08/25/2013 hemoglobin was 8.5 hematocrit of 26 MCV of 91. She was then transfused one unit of packed rbc's at [facility] on Friday 5/1. Patient states that ever since January she has had intermittent rectal bleeding. She says she may go for a couple of days and not see any blood and then will have several days of noticing some bright red blood and then dark "congealed blood. She will sometimes have a bowel movement and passed just blood. She has no complaints of abdominal pain or cramping. She feels the most of the time the blood is separate from her stool. No complaints of rectal pain or discomfort. She does have ongoing fatigue and had some shortness of breath prior to the transfusion on Friday. Patient has undergone a: Endo in 2011 per [name] also done for anemia. Colonoscopy showed diverticulosis and internal hemorrhoids an EGD mild gastritis.  Since that time however she was  diagnosed with endometrial cancer and underwent a hysterectomy followed by radiation therapy which she completed at [facility]. Her daughter states that was in 2012. Patient also has history of congestive heart failure hypertension coronary artery disease and a remote TIA . She has COPD and is on oxygen at night    Review of Systems  Constitutional: Positive for fatigue.  HENT: Negative.   Eyes: Negative.   Cardiovascular: Negative.   Gastrointestinal: Positive for diarrhea and blood in stool.  Endocrine: Negative.   Genitourinary: Negative.   Musculoskeletal: Negative.   Skin: Negative.   Allergic/Immunologic: Negative.   Neurological: Negative.   Hematological: Negative.   Psychiatric/Behavioral: Negative.    Outpatient Prescriptions Prior to Visit  Medication Sig Dispense Refill  . bisoprolol (ZEBETA) 5 MG tablet Take 5 mg by mouth every morning.      . Calcium Carb-Cholecalciferol (CALCIUM 600 + D) 600-200 MG-UNIT TABS Take 1 tablet by mouth 2 (two) times daily.      Marland Kitchen diltiazem (CARDIZEM CD) 240 MG 24 hr capsule Take 1 capsule (240 mg total) by mouth every evening.  30 capsule  3  . furosemide (LASIX) 40 MG tablet 1 tablet (40mg ) in the AM, 1/2 tablet (20mg ) in the PM  45 tablet  3  . iron polysaccharides (FERREX 150) 150 MG capsule Take 150 mg by mouth 2 (two) times daily.      Marland Kitchen levothyroxine (SYNTHROID, LEVOTHROID) 50 MCG tablet Take 50 mcg by mouth daily before breakfast.      .  Melatonin 3-10 MG TABS Take 1 tablet by mouth every evening.        . nitroGLYCERIN (NITROSTAT) 0.4 MG SL tablet Place 0.4 mg under the tongue every 5 (five) minutes as needed.        . potassium chloride SA (K-DUR,KLOR-CON) 20 MEQ tablet Take 20 mEq by mouth 2 (two) times daily.      . simvastatin (ZOCOR) 40 MG tablet TAKE 1 TABLET BY MOUTH EVERY EVENING  30 tablet  5  . tiotropium (SPIRIVA) 18 MCG inhalation capsule Place 1 capsule (18 mcg total) into inhaler and inhale daily.  40 capsule  0  .  Tiotropium Bromide Monohydrate (SPIRIVA RESPIMAT) 2.5 MCG/ACT AERS Inhale 2 puffs into the lungs daily.  8 g  0  . apixaban (ELIQUIS) 2.5 MG TABS tablet Take 1 tablet (2.5 mg total) by mouth 2 (two) times daily.  60 tablet  4  . KLOR-CON M20 20 MEQ tablet TAKE 1 TABLET BY MOUTH TWICE A DAY  60 tablet  5  . SPIRIVA HANDIHALER 18 MCG inhalation capsule PLACE 1 CAPSULE INTO INHALER AND INHALE DAILY.  30 capsule  6   No facility-administered medications prior to visit.   Allergies  Allergen Reactions  . Albuterol Other (See Comments) and Swelling    Pt states she was shaking  devloped tremors, swelling during PFT following albuterol   Patient Active Problem List   Diagnosis Date Noted  . GI bleed 05/03/2013  . 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 Metastatic cancer to pelvis 08/15/2011  . Malignant neoplasm of corpus uteri 07/28/2011  . Cancer of corpus uteri, except isthmus 07/28/2011  . Endometrial carcinoma 06/17/2011  . Emphysema 08/15/2010  . Pre-operative respiratory examination 08/15/2010  . Coronary artery disease 08/08/2010  . ANEMIA 11/17/2009  . WEIGHT LOSS 11/17/2009  . DIASTOLIC HEART FAILURE, CHRONIC 08/02/2009  . Atrial fibrillation 07/21/2009  . CAROTID ARTERY STENOSIS, RIGHT 07/21/2009  . Unspecified peripheral vascular disease 07/21/2009  . OTHER SPECIFIED IRON DEFICIENCY ANEMIAS 06/22/2009  . CAROTID BRUIT, RIGHT 06/22/2009  . HYPOTHYROIDISM 06/05/2009  . HYPERTENSION 06/05/2009  . TIA 06/05/2009  . ALLERGIC RHINITIS 06/05/2009  . PULMONARY NODULE 06/05/2009  . DYSPNEA 06/05/2009   History  Substance Use Topics  . Smoking status: Passive Smoke Exposure - Never Smoker  . Smokeless tobacco: Never Used  . Alcohol Use: No      .family history includes Breast cancer in her daughter; COPD in her sister; Cancer in her brother; Coronary artery disease in her other; Heart disease in her brother and mother; Heart disease (age of onset: 59) in  her sister; Hypertension in her brother, mother, and sister; Lung cancer in her sister; Prostate cancer in her son; Stroke in her mother.  Objective:   Physical Exam well-developed elderly white female in no acute distress accompanied by her daughter blood pressure 108/60 pulse 72 height 5 foot 1 weight 129 . HEENT; nontraumatic normocephalic EOMI PERRLA sclera anicteric, Supple ;no JVD, Cardiovascular; regular rate and rhythm with S1-S2 no murmur or gallop, Pulmonary; clear bilaterally, Abdomen ;soft, she does have a low  incisional scar, bowel sounds are active she is basically nontender there is no palpable mass or hepatosplenomegaly, Rectal ;exam small external hemorrhoidal tags stool is very dark but not grossly bloody and a strongly Hemoccult positive, Extremities; no clubbing,cyanosis ,or edema skin warm and dry, Psych ;mood and affect appropriate.        Assessment & Plan:  #1  89-year-old female   with ongoing intermittent rectal bleeding of dark and bright red blood x4 months. Patient had an admission in January with bleeding in the setting of supratherapeutic INR. This was felt to be diverticular however she has continued to have intermittent bleeding despite being off of anticoagulation over the past 4 months and required a blood transfusion last week do to progressive anemia.. Etiology of her bleeding is not clear, am concerned she may have a radiation-induced proctitis or colitis #2 anemia normocytic secondary to above #3 history of chronic iron deficiency on supplements #4 COPD on nocturnal oxygen #5 history of atrial fibrillation #6 coronary artery disease #7 hypertension #8  congestive heart failure #9 history of TIA #10 hypertension #11 diverticulosis  Plan; long discussion with patient and her daughter regarding management. She is of advanced age and has multiple comorbidities however with ongoing bleeding for 4 months and no definite diagnosis a colonoscopy is indicated. If  no procedure done , and no diagnosis we will be left with managing with transfusions as indicated and serial hemoglobins. Patient would like to pursue colonoscopy for diagnostic purposes. I have scheduled this at [facility] with Dr. Pyrtle later this week. We also discussed the increased risk of anesthesia do to her comorbidities and advanced age and possible technical difficulties due to diverticulosis and any radiation colitis. Will repeat CBC today and she will need ongoing serial CBCs Plan is to leave off of anticoagulation at this time Further plans pending results of colonoscopy  Addendum: Reviewed and agree with initial management. Jay M Pyrtle, MD       

## 2013-08-30 NOTE — Patient Instructions (Addendum)
You have been scheduled for a colonoscopy with propofol with Dr. Zenovia Jarred at [facility]. Please follow written instructions given to you at your visit today.  We have given you the sample for the colonoscopy prep. If you use inhalers (even only as needed), please bring them with you on the day of your procedure.

## 2013-09-01 ENCOUNTER — Encounter: Payer: Self-pay | Admitting: Internal Medicine

## 2013-09-02 ENCOUNTER — Ambulatory Visit (HOSPITAL_COMMUNITY): Payer: Medicare Other | Admitting: Anesthesiology

## 2013-09-02 ENCOUNTER — Encounter (HOSPITAL_COMMUNITY)
Admission: RE | Disposition: A | Discharge status: Home / Self Care | Payer: Self-pay | Source: Ambulatory Visit | Attending: Internal Medicine

## 2013-09-02 ENCOUNTER — Encounter (HOSPITAL_COMMUNITY): Payer: Self-pay

## 2013-09-02 ENCOUNTER — Ambulatory Visit (HOSPITAL_COMMUNITY)
Admission: RE | Admit: 2013-09-02 | Discharge: 2013-09-02 | Disposition: A | Discharge status: Home / Self Care | Payer: Medicare Other | Source: Ambulatory Visit | Attending: Internal Medicine | Admitting: Internal Medicine

## 2013-09-02 ENCOUNTER — Encounter (HOSPITAL_COMMUNITY): Payer: Medicare Other | Admitting: Anesthesiology

## 2013-09-02 DIAGNOSIS — K922 Gastrointestinal hemorrhage, unspecified: Secondary | ICD-10-CM

## 2013-09-02 DIAGNOSIS — D649 Anemia, unspecified: Secondary | ICD-10-CM | POA: Insufficient documentation

## 2013-09-02 DIAGNOSIS — I10 Essential (primary) hypertension: Secondary | ICD-10-CM | POA: Insufficient documentation

## 2013-09-02 DIAGNOSIS — Z8673 Personal history of transient ischemic attack (TIA), and cerebral infarction without residual deficits: Secondary | ICD-10-CM | POA: Insufficient documentation

## 2013-09-02 DIAGNOSIS — J449 Chronic obstructive pulmonary disease, unspecified: Secondary | ICD-10-CM | POA: Insufficient documentation

## 2013-09-02 DIAGNOSIS — K644 Residual hemorrhoidal skin tags: Secondary | ICD-10-CM | POA: Insufficient documentation

## 2013-09-02 DIAGNOSIS — Z888 Allergy status to other drugs, medicaments and biological substances status: Secondary | ICD-10-CM | POA: Insufficient documentation

## 2013-09-02 DIAGNOSIS — K449 Diaphragmatic hernia without obstruction or gangrene: Secondary | ICD-10-CM | POA: Insufficient documentation

## 2013-09-02 DIAGNOSIS — K5730 Diverticulosis of large intestine without perforation or abscess without bleeding: Secondary | ICD-10-CM | POA: Insufficient documentation

## 2013-09-02 DIAGNOSIS — K625 Hemorrhage of anus and rectum: Secondary | ICD-10-CM | POA: Insufficient documentation

## 2013-09-02 DIAGNOSIS — Y842 Radiological procedure and radiotherapy as the cause of abnormal reaction of the patient, or of later complication, without mention of misadventure at the time of the procedure: Secondary | ICD-10-CM | POA: Insufficient documentation

## 2013-09-02 DIAGNOSIS — Z79899 Other long term (current) drug therapy: Secondary | ICD-10-CM | POA: Insufficient documentation

## 2013-09-02 DIAGNOSIS — Y9210 Unspecified residential institution as the place of occurrence of the external cause: Secondary | ICD-10-CM | POA: Insufficient documentation

## 2013-09-02 DIAGNOSIS — I5030 Unspecified diastolic (congestive) heart failure: Secondary | ICD-10-CM | POA: Insufficient documentation

## 2013-09-02 DIAGNOSIS — K648 Other hemorrhoids: Secondary | ICD-10-CM | POA: Insufficient documentation

## 2013-09-02 DIAGNOSIS — I2510 Atherosclerotic heart disease of native coronary artery without angina pectoris: Secondary | ICD-10-CM | POA: Insufficient documentation

## 2013-09-02 DIAGNOSIS — K627 Radiation proctitis: Secondary | ICD-10-CM | POA: Diagnosis present

## 2013-09-02 DIAGNOSIS — Z8542 Personal history of malignant neoplasm of other parts of uterus: Secondary | ICD-10-CM | POA: Insufficient documentation

## 2013-09-02 DIAGNOSIS — J4489 Other specified chronic obstructive pulmonary disease: Secondary | ICD-10-CM | POA: Insufficient documentation

## 2013-09-02 DIAGNOSIS — K6289 Other specified diseases of anus and rectum: Secondary | ICD-10-CM | POA: Insufficient documentation

## 2013-09-02 DIAGNOSIS — Z9981 Dependence on supplemental oxygen: Secondary | ICD-10-CM | POA: Insufficient documentation

## 2013-09-02 DIAGNOSIS — I509 Heart failure, unspecified: Secondary | ICD-10-CM | POA: Insufficient documentation

## 2013-09-02 HISTORY — DX: Dependence on supplemental oxygen: Z99.81

## 2013-09-02 HISTORY — DX: Chronic obstructive pulmonary disease, unspecified: J44.9

## 2013-09-02 HISTORY — PX: FLEXIBLE SIGMOIDOSCOPY: SHX5431

## 2013-09-02 HISTORY — DX: Atherosclerotic heart disease of native coronary artery without angina pectoris: I25.10

## 2013-09-02 HISTORY — DX: Hemorrhage of anus and rectum: K62.5

## 2013-09-02 HISTORY — DX: Personal history of other medical treatment: Z92.89

## 2013-09-02 HISTORY — DX: Diverticulitis of intestine, part unspecified, without perforation or abscess without bleeding: K57.92

## 2013-09-02 LAB — POCT I-STAT 4, (NA,K, GLUC, HGB,HCT)
GLUCOSE: 90 mg/dL (ref 70–99)
HEMATOCRIT: 23 % — AB (ref 36.0–46.0)
HEMOGLOBIN: 7.8 g/dL — AB (ref 12.0–15.0)
Potassium: 3.2 mEq/L — ABNORMAL LOW (ref 3.7–5.3)
Sodium: 143 mEq/L (ref 137–147)

## 2013-09-02 SURGERY — SIGMOIDOSCOPY, FLEXIBLE
Anesthesia: Monitor Anesthesia Care

## 2013-09-02 MED ORDER — METOCLOPRAMIDE HCL 5 MG/ML IJ SOLN
INTRAMUSCULAR | Status: DC | PRN
  Administered 2013-09-02: 10 mg via INTRAVENOUS

## 2013-09-02 MED ORDER — PROPOFOL 10 MG/ML IV BOLUS
INTRAVENOUS | Status: AC
  Filled 2013-09-02: qty 40

## 2013-09-02 MED ORDER — FENTANYL CITRATE 0.05 MG/ML IJ SOLN: 25.0000 ug | INTRAMUSCULAR | Status: DC | PRN

## 2013-09-02 MED ORDER — PHENYLEPHRINE 40 MCG/ML (10ML) SYRINGE FOR IV PUSH (FOR BLOOD PRESSURE SUPPORT)
PREFILLED_SYRINGE | INTRAVENOUS | Status: AC
  Filled 2013-09-02: qty 10

## 2013-09-02 MED ORDER — KETAMINE HCL 10 MG/ML IJ SOLN
INTRAMUSCULAR | Status: DC | PRN
  Administered 2013-09-02 (×8): 1 mg via INTRAVENOUS
  Administered 2013-09-02: 5 mg via INTRAVENOUS
  Administered 2013-09-02: 1 mg via INTRAVENOUS
  Administered 2013-09-02: 5 mg via INTRAVENOUS
  Administered 2013-09-02 (×4): 1 mg via INTRAVENOUS

## 2013-09-02 MED ORDER — PROPOFOL INFUSION 10 MG/ML OPTIME
INTRAVENOUS | Status: DC | PRN
  Administered 2013-09-02: 75 ug/kg/min via INTRAVENOUS

## 2013-09-02 MED ORDER — PHENYLEPHRINE HCL 10 MG/ML IJ SOLN
INTRAMUSCULAR | Status: DC | PRN
  Administered 2013-09-02: 40 ug via INTRAVENOUS
  Administered 2013-09-02: 80 ug via INTRAVENOUS

## 2013-09-02 MED ORDER — ONDANSETRON HCL 4 MG/2ML IJ SOLN
INTRAMUSCULAR | Status: AC
  Filled 2013-09-02: qty 2

## 2013-09-02 MED ORDER — LACTATED RINGERS IV SOLN: INTRAVENOUS | Status: DC

## 2013-09-02 MED ORDER — ONDANSETRON HCL 4 MG/2ML IJ SOLN
INTRAMUSCULAR | Status: DC | PRN
  Administered 2013-09-02: 4 mg via INTRAVENOUS

## 2013-09-02 MED ORDER — SODIUM CHLORIDE 0.9 % IV SOLN: INTRAVENOUS | Status: DC

## 2013-09-02 MED ORDER — LACTATED RINGERS IV SOLN
INTRAVENOUS | Status: DC
  Administered 2013-09-02: 1000 mL via INTRAVENOUS

## 2013-09-02 MED ORDER — METOCLOPRAMIDE HCL 5 MG/ML IJ SOLN
INTRAMUSCULAR | Status: AC
  Filled 2013-09-02: qty 2

## 2013-09-02 MED ORDER — PROPOFOL 10 MG/ML IV BOLUS
INTRAVENOUS | Status: DC | PRN
  Administered 2013-09-02 (×2): 20 mg via INTRAVENOUS

## 2013-09-02 NOTE — Transfer of Care (Signed)
Immediate Anesthesia Transfer of Care Note  Patient: [name]  Procedure(s) Performed: Procedure(s): FLEXIBLE SIGMOIDOSCOPY (N/A)  Patient Location: [facility]   Anesthesia Type:MAC  Level of Consciousness: Patient easily awoken, sedated, comfortable, cooperative, following commands, responds to stimulation.   Airway & Oxygen Therapy: Patient spontaneously breathing, ventilating well, oxygen via simple oxygen mask.  Post-op Assessment: Report given to PACU RN, vital signs reviewed and stable, moving all extremities.   Post vital signs: Reviewed and stable.  Complications: No apparent anesthesia complications

## 2013-09-02 NOTE — Anesthesia Postprocedure Evaluation (Signed)
  Anesthesia Post-op Note  Patient: [name]  Procedure(s) Performed: Procedure(s) (LRB): FLEXIBLE SIGMOIDOSCOPY (N/A)  Patient Location: PACU  Anesthesia Type: MAC  Level of Consciousness: awake and alert   Airway and Oxygen Therapy: Patient Spontanous Breathing  Post-op Pain: mild  Post-op Assessment: Post-op Vital signs reviewed, Patient's Cardiovascular Status Stable, Respiratory Function Stable, Patent Airway and No signs of Nausea or vomiting  Last Vitals:  Filed Vitals:   09/02/13 1126  BP: 121/56  Pulse: 80  Temp:   Resp: 19    Post-op Vital Signs: stable   Complications: No apparent anesthesia complications

## 2013-09-02 NOTE — Discharge Instructions (Signed)

## 2013-09-02 NOTE — Anesthesia Preprocedure Evaluation (Addendum)
Anesthesia Evaluation  Patient identified by MRN, date of birth, ID band Patient awake    Reviewed: Allergy & Precautions, H&P , NPO status , Patient's Chart, lab work & pertinent test results, reviewed documented beta blocker date and time   Airway Mallampati: II TM Distance: >3 FB Neck ROM: Full    Dental  (+) Edentulous Upper, Dental Advisory Given   Pulmonary shortness of breath and with exertion, COPD oxygen dependent,  breath sounds clear to auscultation  Pulmonary exam normal       Cardiovascular hypertension, Pt. on medications and Pt. on home beta blockers + CAD + dysrhythmias Atrial Fibrillation Rhythm:Regular Rate:Normal  Diastolic CHF. Good EF   Neuro/Psych TIAnegative psych ROS   GI/Hepatic negative GI ROS, Neg liver ROS, hiatal hernia,   Endo/Other  negative endocrine ROSHypothyroidism   Renal/GU negative Renal ROS  negative genitourinary   Musculoskeletal negative musculoskeletal ROS (+)   Abdominal   Peds negative pediatric ROS (+)  Hematology negative hematology ROS (+) anemia , hgb 10.1   Anesthesia Other Findings   Reproductive/Obstetrics negative OB ROS                          Anesthesia Physical Anesthesia Plan  ASA: IV  Anesthesia Plan: MAC   Post-op Pain Management:    Induction:   Airway Management Planned:   Additional Equipment:   Intra-op Plan:   Post-operative Plan:   Informed Consent:   Plan Discussed with: Surgeon  Anesthesia Plan Comments:         Anesthesia Quick Evaluation

## 2013-09-02 NOTE — Interval H&P Note (Signed)
History and Physical Interval Note: Pt presenting for colonoscopy to evaluate ongoing lower GI bleeding requiring transfusion The nature of the procedure, as well as the risks, benefits, and alternatives were carefully and thoroughly reviewed with the patient. Ample time for discussion and questions allowed. The patient understood, was satisfied, and agreed to proceed.     09/02/2013 9:53 AM  [name]  has presented today for surgery, with the diagnosis of anemia GI Bleed  The various methods of treatment have been discussed with the patient and family. After consideration of risks, benefits and other options for treatment, the patient has consented to  Procedure(s): COLONOSCOPY (N/A) as a surgical intervention .  The patient's history has been reviewed, patient examined, no change in status, stable for surgery.  I have reviewed the patient's chart and labs.  Questions were answered to the patient's satisfaction.     Lajuan Lines [name]

## 2013-09-02 NOTE — Op Note (Signed)
[facility] [place], [address]   FLEXIBLE SIGMOIDOSCOPY PROCEDURE REPORT  PATIENT: [name], [name]  MR#: [id number] BIRTHDATE: 28-Oct-1923 , 89  yrs. old GENDER: Female ENDOSCOPIST: Jerene Bears, MD PROCEDURE DATE:  09/02/2013 PROCEDURE:   Sigmoidoscopy with ablation therapy ASA CLASS:   Class III INDICATIONS:anemia, requiring transfusion,   rectal bleeding. MEDICATIONS: MAC sedation, administered by CRNA and See Anesthesia Report.  DESCRIPTION OF PROCEDURE:   After the risks benefits and alternatives of the procedure were thoroughly explained, informed consent was obtained.  Rectal exam revealed several skin tags and a very small external hemorrhoid. The Pentax pediatric colonoscope was introduced through the anus  and advanced to the sigmoid colon , limited by sigmoid tortuosity. No adverse events experienced. The quality of the prep was good .  The instrument was then slowly withdrawn as the mucosa was fully examined.       COLON FINDINGS:  Colonoscopy was planned however, due to angulation and luminal narrowing in the sigmoid this sigmoid colon could not be traversed today (despite pediatric colonoscope, water irrigation, change in position and mild counter-pressure.  CO2 insufflation was used. There was moderate diverticulosis noted in the sigmoid colon with associated luminal narrowing.  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  There were areas of active oozing.  Destruction of tissue via APC was performed with success (ERBE rectum setting, 20W, 1L/min).  Care was given to ensure that the lumen was suctioned well.  Retroflexed views revealed small internal hemorrhoid.    The scope was then withdrawn from the patient and the procedure terminated.  COMPLICATIONS: There were no complications.  ENDOSCOPIC IMPRESSION: 1.   There was moderate diverticulosis noted in the sigmoid  colon, procedure converted to flexible sigmoidoscopy as above 2.   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  Avoid aspirin and NSAIDs 2.  Closely monitor Hgb and HCT 3.  Office follow-up in 3-4 weeks with Amy Esterwood, PA-C or myself 4.  May require additional treatment with APC if bleeding fails to resolve.  Another option would be sucralfate enema, but will observe response for now  eSigned:  Jerene Bears, MD 09/02/2013 11:02 AM   CC:The Patient Burnard Bunting, MD [place], Amy PA-C  PATIENT NAME:  [name], [name] MR#: [id number]

## 2013-09-02 NOTE — H&P (View-Only) (Signed)
Subjective:    Patient ID: [name], female    DOB: 06-09-1923, 80 y.o.   MRN: [id number]  HPI [name] is a pleasant 80 year old white female known remotely to Dr. Henrene Pastor from a colonoscopy done in 2007 for anemia. She was noted to have diverticulosis from the sigmoid to be a ascending colon and otherwise negative exam. 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 She was on Coumadin until she was admitted in January 2015 with painless rectal bleeding. At that time her INR was supratherapeutic she required 3 units of packed rbc's and was felt to have had a suspected diverticular bleed though colonoscopy was not done. She was not seen by GI during that admission. However her anticoagulation was stopped and she has not been placed back on blood thinners since.  Patient and daughter relates that her hemoglobin had been in the 10 range but had drifted back down to 8 last week when she was seen a doctor [facility]'s office. Copies of her labs were faxed and on 08/25/2013 hemoglobin was 8.5 hematocrit of 26 MCV of 91. She was then transfused one unit of packed rbc's at [facility] on Friday 5/1. Patient states that ever since January she has had intermittent rectal bleeding. She says she may go for a couple of days and not see any blood and then will have several days of noticing some bright red blood and then dark "congealed blood. She will sometimes have a bowel movement and passed just blood. She has no complaints of abdominal pain or cramping. She feels the most of the time the blood is separate from her stool. No complaints of rectal pain or discomfort. She does have ongoing fatigue and had some shortness of breath prior to the transfusion on Friday. Patient has undergone a: Endo in 2011 per Dr.Hung also done for anemia. Colonoscopy showed diverticulosis and internal hemorrhoids an EGD mild gastritis.  Since that time however she was  diagnosed with endometrial cancer and underwent a hysterectomy followed by radiation therapy which she completed at [facility]. Her daughter states that was in 2012. Patient also has history of congestive heart failure hypertension coronary artery disease and a remote TIA . She has COPD and is on oxygen at night    Review of Systems  Constitutional: Positive for fatigue.  HENT: Negative.   Eyes: Negative.   Cardiovascular: Negative.   Gastrointestinal: Positive for diarrhea and blood in stool.  Endocrine: Negative.   Genitourinary: Negative.   Musculoskeletal: Negative.   Skin: Negative.   Allergic/Immunologic: Negative.   Neurological: Negative.   Hematological: Negative.   Psychiatric/Behavioral: Negative.    Outpatient Prescriptions Prior to Visit  Medication Sig Dispense Refill  . bisoprolol (ZEBETA) 5 MG tablet Take 5 mg by mouth every morning.      . Calcium Carb-Cholecalciferol (CALCIUM 600 + D) 600-200 MG-UNIT TABS Take 1 tablet by mouth 2 (two) times daily.      Marland Kitchen diltiazem (CARDIZEM CD) 240 MG 24 hr capsule Take 1 capsule (240 mg total) by mouth every evening.  30 capsule  3  . furosemide (LASIX) 40 MG tablet 1 tablet (40mg ) in the AM, 1/2 tablet (20mg ) in the PM  45 tablet  3  . iron polysaccharides (FERREX 150) 150 MG capsule Take 150 mg by mouth 2 (two) times daily.      Marland Kitchen levothyroxine (SYNTHROID, LEVOTHROID) 50 MCG tablet Take 50 mcg by mouth daily before breakfast.      .  Melatonin 3-10 MG TABS Take 1 tablet by mouth every evening.        . nitroGLYCERIN (NITROSTAT) 0.4 MG SL tablet Place 0.4 mg under the tongue every 5 (five) minutes as needed.        . potassium chloride SA (K-DUR,KLOR-CON) 20 MEQ tablet Take 20 mEq by mouth 2 (two) times daily.      . simvastatin (ZOCOR) 40 MG tablet TAKE 1 TABLET BY MOUTH EVERY EVENING  30 tablet  5  . tiotropium (SPIRIVA) 18 MCG inhalation capsule Place 1 capsule (18 mcg total) into inhaler and inhale daily.  40 capsule  0  .  Tiotropium Bromide Monohydrate (SPIRIVA RESPIMAT) 2.5 MCG/ACT AERS Inhale 2 puffs into the lungs daily.  8 g  0  . apixaban (ELIQUIS) 2.5 MG TABS tablet Take 1 tablet (2.5 mg total) by mouth 2 (two) times daily.  60 tablet  4  . KLOR-CON M20 20 MEQ tablet TAKE 1 TABLET BY MOUTH TWICE A DAY  60 tablet  5  . SPIRIVA HANDIHALER 18 MCG inhalation capsule PLACE 1 CAPSULE INTO INHALER AND INHALE DAILY.  30 capsule  6   No facility-administered medications prior to visit.   Allergies  Allergen Reactions  . Albuterol Other (See Comments) and Swelling    Pt states she was shaking  devloped tremors, swelling during PFT following albuterol   Patient Active Problem List   Diagnosis Date Noted  . GI bleed 05/03/2013  . 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 Metastatic cancer to pelvis 08/15/2011  . Malignant neoplasm of corpus uteri 07/28/2011  . Cancer of corpus uteri, except isthmus 07/28/2011  . Endometrial carcinoma 06/17/2011  . Emphysema 08/15/2010  . Pre-operative respiratory examination 08/15/2010  . Coronary artery disease 08/08/2010  . ANEMIA 11/17/2009  . WEIGHT LOSS 11/17/2009  . DIASTOLIC HEART FAILURE, CHRONIC 08/02/2009  . Atrial fibrillation 07/21/2009  . CAROTID ARTERY STENOSIS, RIGHT 07/21/2009  . Unspecified peripheral vascular disease 07/21/2009  . OTHER SPECIFIED IRON DEFICIENCY ANEMIAS 06/22/2009  . CAROTID BRUIT, RIGHT 06/22/2009  . HYPOTHYROIDISM 06/05/2009  . HYPERTENSION 06/05/2009  . TIA 06/05/2009  . ALLERGIC RHINITIS 06/05/2009  . PULMONARY NODULE 06/05/2009  . DYSPNEA 06/05/2009   History  Substance Use Topics  . Smoking status: Passive Smoke Exposure - Never Smoker  . Smokeless tobacco: Never Used  . Alcohol Use: No      .family history includes Breast cancer in her daughter; COPD in her sister; Cancer in her brother; Coronary artery disease in her other; Heart disease in her brother and mother; Heart disease (age of onset: 31) in  her sister; Hypertension in her brother, mother, and sister; Lung cancer in her sister; Prostate cancer in her son; Stroke in her mother.  Objective:   Physical Exam well-developed elderly white female in no acute distress accompanied by her daughter blood pressure 108/60 pulse 72 height 5 foot 1 weight 129 . HEENT; nontraumatic normocephalic EOMI PERRLA sclera anicteric, Supple ;no JVD, Cardiovascular; regular rate and rhythm with S1-S2 no murmur or gallop, Pulmonary; clear bilaterally, Abdomen ;soft, she does have a low  incisional scar, bowel sounds are active she is basically nontender there is no palpable mass or hepatosplenomegaly, Rectal ;exam small external hemorrhoidal tags stool is very dark but not grossly bloody and a strongly Hemoccult positive, Extremities; no clubbing,cyanosis ,or edema skin warm and dry, Psych ;mood and affect appropriate.        Assessment & Plan:  #15  80 year old female  with ongoing intermittent rectal bleeding of dark and bright red blood x4 months. Patient had an admission in January with bleeding in the setting of supratherapeutic INR. This was felt to be diverticular however she has continued to have intermittent bleeding despite being off of anticoagulation over the past 4 months and required a blood transfusion last week do to progressive anemia.. Etiology of her bleeding is not clear, am concerned she may have a radiation-induced proctitis or colitis #2 anemia normocytic secondary to above #3 history of chronic iron deficiency on supplements #4 COPD on nocturnal oxygen #5 history of atrial fibrillation #6 coronary artery disease #7 hypertension #8  congestive heart failure #9 history of TIA #10 hypertension #11 diverticulosis  Plan; long discussion with patient and her daughter regarding management. She is of advanced age and has multiple comorbidities however with ongoing bleeding for 4 months and no definite diagnosis a colonoscopy is indicated. If  no procedure done , and no diagnosis we will be left with managing with transfusions as indicated and serial hemoglobins. Patient would like to pursue colonoscopy for diagnostic purposes. I have scheduled this at [facility] with Dr. Hilarie Fredrickson later this week. We also discussed the increased risk of anesthesia do to her comorbidities and advanced age and possible technical difficulties due to diverticulosis and any radiation colitis. Will repeat CBC today and she will need ongoing serial CBCs Plan is to leave off of anticoagulation at this time Further plans pending results of colonoscopy  Addendum: Reviewed and agree with initial management. Jerene Bears, MD

## 2013-09-03 ENCOUNTER — Encounter (HOSPITAL_COMMUNITY): Payer: Self-pay | Admitting: Internal Medicine

## 2013-09-07 ENCOUNTER — Other Ambulatory Visit (INDEPENDENT_AMBULATORY_CARE_PROVIDER_SITE_OTHER): Payer: Medicare Other

## 2013-09-07 ENCOUNTER — Other Ambulatory Visit: Payer: Self-pay | Admitting: *Deleted

## 2013-09-07 DIAGNOSIS — D649 Anemia, unspecified: Secondary | ICD-10-CM

## 2013-09-07 LAB — CBC WITH DIFFERENTIAL/PLATELET
Basophils Absolute: 0 10*3/uL (ref 0.0–0.1)
Basophils Relative: 0.4 % (ref 0.0–3.0)
EOS PCT: 2.1 % (ref 0.0–5.0)
Eosinophils Absolute: 0.1 10*3/uL (ref 0.0–0.7)
HCT: 26.1 % — ABNORMAL LOW (ref 36.0–46.0)
Hemoglobin: 8.7 g/dL — ABNORMAL LOW (ref 12.0–15.0)
LYMPHS PCT: 14.9 % (ref 12.0–46.0)
Lymphs Abs: 0.7 10*3/uL (ref 0.7–4.0)
MCHC: 33.4 g/dL (ref 30.0–36.0)
MCV: 88.4 fl (ref 78.0–100.0)
Monocytes Absolute: 0.7 10*3/uL (ref 0.1–1.0)
Monocytes Relative: 13.7 % — ABNORMAL HIGH (ref 3.0–12.0)
NEUTROS PCT: 68.9 % (ref 43.0–77.0)
Neutro Abs: 3.3 10*3/uL (ref 1.4–7.7)
Platelets: 240 10*3/uL (ref 150.0–400.0)
RBC: 2.95 Mil/uL — ABNORMAL LOW (ref 3.87–5.11)
RDW: 13.6 % (ref 11.5–15.5)
WBC: 4.8 10*3/uL (ref 4.0–10.5)

## 2013-09-12 ENCOUNTER — Other Ambulatory Visit: Payer: Self-pay | Admitting: Cardiology

## 2013-09-13 ENCOUNTER — Telehealth: Payer: Self-pay | Admitting: Internal Medicine

## 2013-09-13 NOTE — Telephone Encounter (Signed)
Patient is due for labs for on 09/21/13.  I have reported this to her daughter

## 2013-09-13 NOTE — Telephone Encounter (Signed)
Left message for patient to call back  

## 2013-09-21 ENCOUNTER — Other Ambulatory Visit: Payer: Self-pay | Admitting: *Deleted

## 2013-09-21 ENCOUNTER — Other Ambulatory Visit (INDEPENDENT_AMBULATORY_CARE_PROVIDER_SITE_OTHER): Payer: Medicare Other

## 2013-09-21 DIAGNOSIS — D649 Anemia, unspecified: Secondary | ICD-10-CM

## 2013-09-21 LAB — CBC WITH DIFFERENTIAL/PLATELET
BASOS PCT: 0.5 % (ref 0.0–3.0)
Basophils Absolute: 0 10*3/uL (ref 0.0–0.1)
EOS ABS: 0.2 10*3/uL (ref 0.0–0.7)
EOS PCT: 4.8 % (ref 0.0–5.0)
HCT: 27.6 % — ABNORMAL LOW (ref 36.0–46.0)
HEMOGLOBIN: 9.1 g/dL — AB (ref 12.0–15.0)
Lymphocytes Relative: 14.4 % (ref 12.0–46.0)
Lymphs Abs: 0.7 10*3/uL (ref 0.7–4.0)
MCHC: 32.9 g/dL (ref 30.0–36.0)
MCV: 87.7 fl (ref 78.0–100.0)
MONO ABS: 0.6 10*3/uL (ref 0.1–1.0)
Monocytes Relative: 12.5 % — ABNORMAL HIGH (ref 3.0–12.0)
Neutro Abs: 3.4 10*3/uL (ref 1.4–7.7)
Neutrophils Relative %: 67.8 % (ref 43.0–77.0)
Platelets: 235 10*3/uL (ref 150.0–400.0)
RBC: 3.15 Mil/uL — AB (ref 3.87–5.11)
RDW: 13.6 % (ref 11.5–15.5)
WBC: 5 10*3/uL (ref 4.0–10.5)

## 2013-10-06 ENCOUNTER — Ambulatory Visit (INDEPENDENT_AMBULATORY_CARE_PROVIDER_SITE_OTHER): Payer: Medicare Other | Admitting: Gynecology

## 2013-10-06 ENCOUNTER — Encounter: Payer: Self-pay | Admitting: Gynecology

## 2013-10-06 VITALS — BP 116/74 | Ht 61.0 in | Wt 127.0 lb

## 2013-10-06 DIAGNOSIS — R32 Unspecified urinary incontinence: Secondary | ICD-10-CM

## 2013-10-06 DIAGNOSIS — N952 Postmenopausal atrophic vaginitis: Secondary | ICD-10-CM

## 2013-10-06 DIAGNOSIS — C55 Malignant neoplasm of uterus, part unspecified: Secondary | ICD-10-CM

## 2013-10-06 NOTE — Patient Instructions (Signed)
Followup with your oncology doctor and your internal medicine doctor as scheduled. Check to see about your bone density with Dr. Reynaldo Minium Followup in one year, sooner as needed.

## 2013-10-06 NOTE — Progress Notes (Signed)
[name] 08-Mar-1924 [id number]        80 y.o.  [id number] for followup exam. 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 Received vaginal cuff radiation through 2014. Sees Dr. Rhodia Albright at [facility] every 6 months.  Past medical history,surgical history, problem list, medications, allergies, family history and social history were all reviewed and documented as reviewed in the EPIC chart.  ROS:  12 system ROS performed with pertinent positives and negatives included in the history, assessment and plan.  Included Systems: General, HEENT, Neck, Cardiovascular, Pulmonary, Gastrointestinal, Genitourinary, Musculoskeletal, Dermatologic, Endocrine, Hematological, Neurologic, Psychiatric Additional significant findings :  Urinary incontinence as discussed below   Exam: Kim assistant Filed Vitals:   10/06/13 1401  BP: 116/74  Height: 5\' 1"  (1.549 m)  Weight: 127 lb (57.607 kg)   General appearance:  Normal affect, orientation and appearance. Skin: Grossly normal HEENT: Without gross lesions.  No cervical or supraclavicular adenopathy. Thyroid normal.  Lungs:  Clear without wheezing, rales or rhonchi Cardiac: RR, without RMG Abdominal:  Soft, nontender, without masses, guarding, rebound, organomegaly or hernia Breasts:  Examined lying and sitting without masses, retractions, discharge or axillary adenopathy. Pelvic:  Ext/BUS/vagina atrophic with shortened vagina. No gross lesions visualized or palpated.  Adnexa  Without masses or tenderness    Anus and perineum  Normal   Rectovaginal  Normal sphincter tone without palpated masses or tenderness.    Assessment/Plan:  80 y.o. [id number] female   1. Stage IA serous high-grade endometrial carcinoma 2012 with vaginal cuff recurrence and subcutaneous radiation. Exam today NED with significant atrophic/radiation changes and shortened vagina. She is not sexually active  and this is not an issue. Has an appointment to see Dr. Rhodia Albright in another week or 2. States that he does Pap smears and I did not do a Pap smear today. 2. Urinary incontinence. Patient has loss of urine with movement or straining. No urgency frequency or other urinary symptoms She did lose some urine on exam today whenever she was moving down the table. Exam does not show any gross cystocele but again very shortened vagina. Reviewed with the patient and her daughter issues of incontinence options. I would not recommend surgery at this time given her radiation history, distorted fragile tissue planes. Possibility for collagen injection discussed. At this point patient does not want intervention. Will check urinalysis. 3. Osteopenia. It's been a number of years and she's had a bone density but remembers Dr. Dyane Dustman talking about osteopenia. She has an appointment to see him in several weeks I've asked her daughter to make sure that they checked about bone density and whether she needs to have one done now. The issues of fracture prevention reviewed. 4. Mammography 04/2011. Continue with annual mammography. SBE monthly reviewed. 5. Flexible sigmoidoscopy 2015. Repeat at their recommended interval. 6. Health maintenance. No routine blood work done this is done through her other physician's offices. Followup in one year, sooner as needed.   Note: This document was prepared with digital dictation and possible smart phrase technology. Any transcriptional errors that result from this process are unintentional.   Anastasio Auerbach MD, 2:28 PM 10/06/2013

## 2013-10-07 LAB — URINALYSIS W MICROSCOPIC + REFLEX CULTURE
BACTERIA UA: NONE SEEN
BILIRUBIN URINE: NEGATIVE
CRYSTALS: NONE SEEN
Casts: NONE SEEN
GLUCOSE, UA: NEGATIVE mg/dL
Hgb urine dipstick: NEGATIVE
Ketones, ur: NEGATIVE mg/dL
Leukocytes, UA: NEGATIVE
Nitrite: NEGATIVE
PROTEIN: NEGATIVE mg/dL
SPECIFIC GRAVITY, URINE: 1.008 (ref 1.005–1.030)
SQUAMOUS EPITHELIAL / LPF: NONE SEEN
Urobilinogen, UA: 0.2 mg/dL (ref 0.0–1.0)
pH: 7.5 (ref 5.0–8.0)

## 2013-10-21 ENCOUNTER — Telehealth: Payer: Self-pay | Admitting: *Deleted

## 2013-10-21 NOTE — Telephone Encounter (Signed)
Spoke with patient's daughter and patient will come for labs next week.

## 2013-10-21 NOTE — Telephone Encounter (Signed)
Message copied by Hulan Saas on Thu Oct 21, 2013 10:00 AM ------      Message from: Hulan Saas      Created: Tue Sep 21, 2013  3:29 PM       Call and remind patient due for CBC on 10/25/13 AE. Lab in EPIC ------

## 2013-10-21 NOTE — Telephone Encounter (Signed)
Left a message for patient's daughter Nicole Kindred to call back.

## 2013-10-26 ENCOUNTER — Other Ambulatory Visit (INDEPENDENT_AMBULATORY_CARE_PROVIDER_SITE_OTHER): Payer: Medicare Other

## 2013-10-26 DIAGNOSIS — D649 Anemia, unspecified: Secondary | ICD-10-CM

## 2013-10-26 LAB — CBC WITH DIFFERENTIAL/PLATELET
BASOS ABS: 0 10*3/uL (ref 0.0–0.1)
Basophils Relative: 0.6 % (ref 0.0–3.0)
EOS ABS: 0.2 10*3/uL (ref 0.0–0.7)
Eosinophils Relative: 4.2 % (ref 0.0–5.0)
HCT: 31.5 % — ABNORMAL LOW (ref 36.0–46.0)
Hemoglobin: 10.7 g/dL — ABNORMAL LOW (ref 12.0–15.0)
LYMPHS PCT: 13.7 % (ref 12.0–46.0)
Lymphs Abs: 0.7 10*3/uL (ref 0.7–4.0)
MCHC: 33.8 g/dL (ref 30.0–36.0)
MCV: 84.9 fl (ref 78.0–100.0)
MONOS PCT: 12.3 % — AB (ref 3.0–12.0)
Monocytes Absolute: 0.7 10*3/uL (ref 0.1–1.0)
NEUTROS ABS: 3.7 10*3/uL (ref 1.4–7.7)
NEUTROS PCT: 69.2 % (ref 43.0–77.0)
Platelets: 213 10*3/uL (ref 150.0–400.0)
RBC: 3.71 Mil/uL — ABNORMAL LOW (ref 3.87–5.11)
RDW: 14.2 % (ref 11.5–15.5)
WBC: 5.4 10*3/uL (ref 4.0–10.5)

## 2013-11-11 ENCOUNTER — Other Ambulatory Visit: Payer: Self-pay | Admitting: Cardiology

## 2013-12-01 ENCOUNTER — Other Ambulatory Visit: Payer: Self-pay | Admitting: Cardiology

## 2013-12-20 ENCOUNTER — Encounter: Payer: Self-pay | Admitting: Family

## 2013-12-21 ENCOUNTER — Ambulatory Visit (INDEPENDENT_AMBULATORY_CARE_PROVIDER_SITE_OTHER): Payer: Medicare Other | Admitting: Family

## 2013-12-21 ENCOUNTER — Encounter: Payer: Self-pay | Admitting: Family

## 2013-12-21 ENCOUNTER — Ambulatory Visit (HOSPITAL_COMMUNITY)
Admission: RE | Admit: 2013-12-21 | Discharge: 2013-12-21 | Disposition: A | Discharge status: Home / Self Care | Payer: Medicare Other | Source: Ambulatory Visit | Attending: Family | Admitting: Family

## 2013-12-21 VITALS — BP 112/63 | HR 61 | Resp 16 | Ht 61.0 in | Wt 122.0 lb

## 2013-12-21 DIAGNOSIS — I6529 Occlusion and stenosis of unspecified carotid artery: Secondary | ICD-10-CM | POA: Diagnosis not present

## 2013-12-21 DIAGNOSIS — Z48812 Encounter for surgical aftercare following surgery on the circulatory system: Secondary | ICD-10-CM | POA: Diagnosis present

## 2013-12-21 NOTE — Addendum Note (Signed)
Addended by: Mena Goes on: 12/21/2013 03:51 PM   Modules accepted: Orders

## 2013-12-21 NOTE — Progress Notes (Signed)
Established Carotid Patient   History of Present Illness  [name] is a 80 y.o. female patient of Dr. Kellie Simmering who is status post right carotid endarterectomy in 2011. She returns today for follow up. She had a TIA before 2001 as manifested by expressive aphasia, denies hemiparesis, denies monocular loss of vision; no TIA or stroke symptoms since then.  Pt denies any claudication symptoms with walking.  Pt denies New Medical or Surgical History.  Pt Diabetic: No Pt smoker: non-smoker, but she had lifelong exposure to second hand smoke and has COPD  Pt meds include: Statin : Yes ASA: No: she had rectal bleeding on coumadin which was stopped, then Eliquis tried and she again had rectal bleeding, per daughter. Rectal bleeding is thought to be facilitated by radiation treatment for uterine cancer after her hysterectomy. Other anticoagulants/antiplatelets: no   Past Medical History  Diagnosis Date  . TIA (transient ischemic attack) yrs ago  . Unspecified hypothyroidism   . Unspecified essential hypertension   . Allergic rhinitis, cause unspecified   . Hiatal hernia   . SOB (shortness of breath)     unexplained SOB onset fall 2010. PFT normal 06-12-09. Right heatrt Cath 06-26-09 nl PA but wedge 23 with non obst CAD, echo same day: Left ventricle: the cavity size was normal. Wall thickness was increased in a pattern of mild LVH. Systolic function was vigorous. Estimated ejection fraction 65% to 70%. Left atrium mildly dilated. nml walking sats 11-17-2009  . Hyperlipidemia   . Arrhythmia atrial fib  . Carotid artery occlusion     carotid endarterectomy     . Atrial fibrillation     paroxymal  . Carcinoma of uterus Feb. 2012    High-grade serous stage Ia/vaginal cuff recurrence July 2012  . Osteoporosis   . COPD (chronic obstructive pulmonary disease)   . History of blood transfusion 08-26-13    1 unit given 08-26-13, 3 units given jan 2015  . Rectal bleeding 05-12-13    in  hospital 4 days  . Diverticulitis   . Coronary artery disease   . History of home oxygen therapy     uses oxygen 2 liters per nasal cannula at hs  . Anemia   . Stroke     Mini    Social History History  Substance Use Topics  . Smoking status: Passive Smoke Exposure - Never Smoker  . Smokeless tobacco: Never Used  . Alcohol Use: No    Family History Family History  Problem Relation Age of Onset  . Heart disease Mother   . Hypertension Mother   . Stroke Mother   . Heart disease Sister 16    Heart Disease before age 25  . Lung cancer Sister   . COPD Sister   . Hypertension Sister   . Cancer Sister   . Heart disease Brother     Before age 58  . Cancer Brother   . Hypertension Brother   . Heart attack Brother   . Coronary artery disease Other   . Breast cancer Daughter 33  . Cancer Daughter     Breast  . Heart attack Daughter   . Heart disease Daughter     Before age 27  . Prostate cancer Son   . Heart attack Son   . Cancer Son     Prostate    Surgical History Past Surgical History  Procedure Laterality Date  . Tubal ligation  1970  . Carotid endarterectomy  08/04/2009  Right  CEA  . Internal radiation  June 13,2013 and  October 24, 2011    5 dose by Dr. Belva Bertin  . External radiation  May 7,2013- October 07, 2011    25 treatments   . Carotid endarterectomy    . Abdominal hysterectomy  April 20,2012    by Dr. Rhodia Albright at  [facility]  . Cholecystectomy  yrs ago  . Flexible sigmoidoscopy N/A 09/02/2013    Procedure: FLEXIBLE SIGMOIDOSCOPY;  Surgeon: Jerene Bears, MD;  Location: [facility];  Service: Gastroenterology;  Laterality: N/A;    Allergies  Allergen Reactions  . Albuterol Swelling and Other (See Comments)    Pt states she was shaking  devloped tremors, swelling during PFT following albuterol    Current Outpatient Prescriptions  Medication Sig Dispense Refill  . acetaminophen (TYLENOL) 500 MG tablet Take 500 mg by mouth every 6 (six) hours as  needed for mild pain.      . bisoprolol (ZEBETA) 5 MG tablet Take 5 mg by mouth every morning.      . Calcium Carb-Cholecalciferol (CALCIUM 600 + D) 600-200 MG-UNIT TABS Take 1 tablet by mouth 2 (two) times daily.      Marland Kitchen diltiazem (CARDIZEM CD) 240 MG 24 hr capsule TAKE ONE CAPSULE BY MOUTH EVERY EVENING  30 capsule  0  . furosemide (LASIX) 40 MG tablet TAKE 1 TABLET EVERY MORNING ,AND TAKE 1/2 TABLET EVERY EVENING  45 tablet  0  . iron polysaccharides (FERREX 150) 150 MG capsule Take 150 mg by mouth 2 (two) times daily.      Marland Kitchen levothyroxine (SYNTHROID, LEVOTHROID) 50 MCG tablet Take 50 mcg by mouth daily before breakfast.      . nitroGLYCERIN (NITROSTAT) 0.4 MG SL tablet Place 0.4 mg under the tongue every 5 (five) minutes as needed for chest pain.       . potassium chloride SA (K-DUR,KLOR-CON) 20 MEQ tablet Take 20 mEq by mouth 2 (two) times daily.      . simvastatin (ZOCOR) 40 MG tablet Take 40 mg by mouth daily.      . Tiotropium Bromide Monohydrate (SPIRIVA RESPIMAT) 2.5 MCG/ACT AERS Inhale 2 puffs into the lungs daily.      Marland Kitchen diltiazem (DILACOR XR) 240 MG 24 hr capsule Take 240 mg by mouth every morning.       No current facility-administered medications for this visit.    Review of Systems : See HPI for pertinent positives and negatives.  Physical Examination  Filed Vitals:   12/21/13 1404 12/21/13 1408  BP: 132/69 112/63  Pulse: 61 61  Resp:  16  Height:  5\' 1"  (1.549 m)  Weight:  122 lb (55.339 kg)  SpO2:  100%   Body mass index is 23.06 kg/(m^2).  General: WDWN female in NAD GAIT: normal Eyes: PERRLA Pulmonary:  Non-labored, CTAB but diminished air movement in all fields, Negative  Rales, Negative rhonchi, & Negative wheezing.  Cardiac: regular Rhythm ,  Negative detected murmur.  VASCULAR EXAM Carotid Bruits Right Left   Negative Negative    Aorta is not palpable. Radial pulses are 2+ palpable and equal.                        Feet appear well  perfused.  Gastrointestinal: soft, nontender, BS WNL, no r/g,  negative masses.  Musculoskeletal: Negative muscle atrophy/wasting. M/S 5/5 throughout, Extremities without ischemic changes.  Neurologic: A&O X 3; Appropriate Affect ; SENSATION ;normal;  Speech is normal CN 2-12 intact, Pain and light touch intact in extremities, Motor exam as listed above.   Non-Invasive Vascular Imaging CAROTID DUPLEX 12/21/2013   CEREBROVASCULAR DUPLEX EVALUATION    INDICATION: Carotid artery disease     PREVIOUS INTERVENTION(S): Right carotid endarterectomy 08/04/2009 by Dr. Scot Dock.    DUPLEX EXAM:     RIGHT  LEFT  Peak Systolic Velocities (cm/s) End Diastolic Velocities (cm/s) Plaque LOCATION Peak Systolic Velocities (cm/s) End Diastolic Velocities (cm/s) Plaque  66 15  CCA PROXIMAL 83 17   70 16  CCA MID 61 12   38 6 HM CCA DISTAL 70 15   67 0  ECA 69 10 HT  59 10  ICA PROXIMAL 93 27 HT  59 19  ICA MID 83 31   76 28  ICA DISTAL 107 34      ICA / CCA Ratio (PSV) 1.75  Antegrade  Vertebral Flow Antegrade   947 Brachial Systolic Pressure (mmHg) 654  Triphasic  Brachial Artery Waveforms Triphasic     Plaque Morphology:  HM = Homogeneous, HT = Heterogeneous, CP = Calcific Plaque, SP = Smooth Plaque, IP = Irregular Plaque     ADDITIONAL FINDINGS:     IMPRESSION: Patent right carotid endarterectomy site with no evidence of hyperplasia or restenosis.  Left internal carotid artery velocities suggest a <40% stenosis.     Compared to the previous exam:  No significant change in comparison to the last exam on 12/09/2012.    Assessment: [name] is a 80 y.o. female who presents with asymptomatic patent right carotid endarterectomy site with no evidence of hyperplasia or restenosis.  Left internal carotid artery velocities suggest a <40% stenosis.  No significant change in comparison to the last exam on 12/09/2012.  Plan: Follow-up in 1 year with Carotid Duplex scan.   I discussed  in depth with the patient the nature of atherosclerosis, and emphasized the importance of maximal medical management including strict control of blood pressure, blood glucose, and lipid levels, obtaining regular exercise, and continued cessation of smoking.  The patient is aware that without maximal medical management the underlying atherosclerotic disease process will progress, limiting the benefit of any interventions. The patient was given information about stroke prevention and what symptoms should prompt the patient to seek immediate medical care. Thank you for allowing [place] to participate in this patient's care.  Clemon Chambers, RN, MSN, FNP-C [facility] Office: [phone]  Clinic Physician: Early  12/21/2013 2:14 PM

## 2013-12-21 NOTE — Patient Instructions (Signed)
Stroke Prevention Some medical conditions and behaviors are associated with an increased chance of having a stroke. You may prevent a stroke by making healthy choices and managing medical conditions. HOW CAN I REDUCE MY RISK OF HAVING A STROKE?   Stay physically active. Get at least 30 minutes of activity on most or all days.  Do not smoke. It may also be helpful to avoid exposure to secondhand smoke.  Limit alcohol use. Moderate alcohol use is considered to be:  No more than 2 drinks per day for men.  No more than 1 drink per day for nonpregnant women.  Eat healthy foods. This involves:  Eating 5 or more servings of fruits and vegetables a day.  Making dietary changes that address high blood pressure (hypertension), high cholesterol, diabetes, or obesity.  Manage your cholesterol levels.  Making food choices that are high in fiber and low in saturated fat, trans fat, and cholesterol may control cholesterol levels.  Take any prescribed medicines to control cholesterol as directed by your health care provider.  Manage your diabetes.  Controlling your carbohydrate and sugar intake is recommended to manage diabetes.  Take any prescribed medicines to control diabetes as directed by your health care provider.  Control your hypertension.  Making food choices that are low in salt (sodium), saturated fat, trans fat, and cholesterol is recommended to manage hypertension.  Take any prescribed medicines to control hypertension as directed by your health care provider.  Maintain a healthy weight.  Reducing calorie intake and making food choices that are low in sodium, saturated fat, trans fat, and cholesterol are recommended to manage weight.  Stop drug abuse.  Avoid taking birth control pills.  Talk to your health care provider about the risks of taking birth control pills if you are over 35 years old, smoke, get migraines, or have ever had a blood clot.  Get evaluated for sleep  disorders (sleep apnea).  Talk to your health care provider about getting a sleep evaluation if you snore a lot or have excessive sleepiness.  Take medicines only as directed by your health care provider.  For some people, aspirin or blood thinners (anticoagulants) are helpful in reducing the risk of forming abnormal blood clots that can lead to stroke. If you have the irregular heart rhythm of atrial fibrillation, you should be on a blood thinner unless there is a good reason you cannot take them.  Understand all your medicine instructions.  Make sure that other conditions (such as anemia or atherosclerosis) are addressed. SEEK IMMEDIATE MEDICAL CARE IF:   You have sudden weakness or numbness of the face, arm, or leg, especially on one side of the body.  Your face or eyelid droops to one side.  You have sudden confusion.  You have trouble speaking (aphasia) or understanding.  You have sudden trouble seeing in one or both eyes.  You have sudden trouble walking.  You have dizziness.  You have a loss of balance or coordination.  You have a sudden, severe headache with no known cause.  You have new chest pain or an irregular heartbeat. Any of these symptoms may represent a serious problem that is an emergency. Do not wait to see if the symptoms will go away. Get medical help at once. Call your local emergency services (911 in [place] Do not drive yourself to the hospital. Document Released: 05/23/2004 Document Revised: 08/30/2013 Document Reviewed: 10/16/2012 [employer] Patient Information 2015 [employer]. This information is not intended to replace advice given   to you by your health care provider. Make sure you discuss any questions you have with your health care provider.  

## 2013-12-28 ENCOUNTER — Other Ambulatory Visit: Payer: Self-pay | Admitting: Cardiology

## 2014-01-09 ENCOUNTER — Other Ambulatory Visit: Payer: Self-pay | Admitting: Cardiology

## 2014-01-17 ENCOUNTER — Ambulatory Visit (INDEPENDENT_AMBULATORY_CARE_PROVIDER_SITE_OTHER): Payer: Medicare Other | Admitting: Internal Medicine

## 2014-01-17 ENCOUNTER — Encounter: Payer: Self-pay | Admitting: Internal Medicine

## 2014-01-17 VITALS — BP 120/70 | HR 66 | Ht 62.0 in | Wt 122.8 lb

## 2014-01-17 DIAGNOSIS — Z23 Encounter for immunization: Secondary | ICD-10-CM

## 2014-01-17 DIAGNOSIS — R911 Solitary pulmonary nodule: Secondary | ICD-10-CM

## 2014-01-17 DIAGNOSIS — R0602 Shortness of breath: Secondary | ICD-10-CM

## 2014-01-17 DIAGNOSIS — R0609 Other forms of dyspnea: Secondary | ICD-10-CM

## 2014-01-17 DIAGNOSIS — R0989 Other specified symptoms and signs involving the circulatory and respiratory systems: Secondary | ICD-10-CM

## 2014-01-17 NOTE — Progress Notes (Signed)
Subjective:    Patient ID: [name], female    DOB: 1924-03-28, 80 y.o.   MRN: [id number]  HPI  OV 11/23/10: Followup dysponea. Since last visit, abdominal wound healed. She reports improved exercise tolerance and less dysponea. Now able to walk 2 blocks without dyspnea.  Dyspnea still relieved by rest. Also brought on by stooping over. Asociated improved ADLs at home plus. Reports sympotoms as mild-moderate. No associated cough, chest pain. Family noticed some associated edema (mild) in hands and feet past few days. Independent review CPST (done on QVAR, emphysema) 11/20/2010 suggests diasgtolic dysfunction - the O2 pulse is flat. She gave good effort and overall VO2 max and AT are normal without resp limitation or desaturation. PMhx review shows hx psotiive for diastolic chf on cath feb 2011  #Shortness of breath  CPSt bike test 1/49/7026 suggests DIASTOLIC DYSFUNCTION of heart as cause of shortness of breath  - this is when the heart muscle has problem relaxing when you exert yourself  Recommend you follow with Dr. Aundra Dubin for this  Recommend also you go through pulmonary rehab exercise program  Return to see me in 4-5 months to review how things are going  Come sooner if there are problems   #Pulmonary nodule  If you remember this was noted April 2012  You need repeat CT chest without contrast April 2013. My nurse will ensure there is an order  OV 04/05/2011 Followup dyspnea. Since last visit overall stable. Dyspnea somewhat improved. She is finishingup her GYN RX. Still some dyspnea with household activities. She is satisfied with her current quality of life but dtr wants her better and able to do more. Patient not so motivated. Dtr wondering about giving her nocturnal o2. Patient not sure about wanting to attend rehab; dtr quite keen she attend.   r#Shortness of breath Recommend also you go through pulmonary rehab exercise program; thanks for agreeing Return to see me in 4-5  months to review how things are going Come sooner if there are problems #Pulmonary nodule  If you remember this was noted April 2012 You need repeat CT chest without contrast April 2013. My nurse will ensure there is an order #Followup  - April 2013 after CT chest  OV 09/13/2011  Started rehab after last OV and fatigue dyspnea improved but had cancer reucrence and so rehab sheleved. Has finished 10 out of 28 exernal XRT and has int XRT pending. Very fatigued and dyspneic again; feels this is due to XRT. Copliant wiht o2 and qvar. Not sure qvar is helping. In interim she did have ONO 04/12/11 - some 21 minutes spent pulse ox <88% but never below 83%. So she can continue to use nocturnal o2.   In terms of nodule,  [facility] APril 2013 PET and CT report shows stable RLL nodule since jan 2012 but also diffuse uptake thyroid on PET scan. Note: emphysema also seen on scan    Past, Family, Social reviewed: no change since last visit other than fact, cancer has recurred and she is undergoing XRT  #Shortness of breath/Fatigue  - Continue oxygen  - stop qvar  - see if tudorza samples (3 months) twice daily helps you  - when recovered from radiation treatment, call rehab and rejoin that  #Pulmonary nodule  If you remember this was noted April 2012 and is stable per report on [facility] CT chest I will now follow this based on scans done at [name] instead of doing CT here  #Thyrooid uptake  on PET  - please asked the [facility] doctors what the diffuse uptake on thyroid on PET scan April 2013 means  #FOllowup  -3-4   Months or sooner if needed   OV 01/06/2012  Tudorza - helping. Using O2. Has had flu shot. No new problems. Struggling to agree to rehab.   XRT ended October 24, 2011 (26 external courses + 5 more internal) and so fatigue improving  Above now has reuslted in dyspnea improving. She is not compelled now to attend rehab. She initially said it helped her but dtr feels motivation is an issue. Patient  also feels she does not want to burden daughter. After much discussion she has agreed  Has had flu shot.    Past, Family, Social reviewed: no change since last visit. Done with XRT    #Shortness of breath/Fatigue/Emphysema  - congrats on flu shot  - Continue oxygen  -continue tudorza  - I have discussed and referred you to pulmonary rehab  #Pulmonary nodule  If you remember this was noted April 2012 and is stable compared to Jan 2-12 per report on [facility] CT chest  I will now follow this based on scans done at [facility] instead of doing CT here  #FOllowup  -6 months  OV 07/08/2012 She presents again with her daughter. The daughter feels that dyspnea some improvement as well as fatigue. They maintain that tudorza definitely helps but is too expensive because of $80 co-pay. However, they are not interested in Atrovent nebulizer due to inconvenience. They will settle for Spiriva. Currently dyspnea is exertional and class II to class III. There is associated fatigue although both are improved. There no new symptoms. I counseled her again about pulmonary rehabilitation. The daughter is extremely keen that she attends. She has reluctantly agreed  Past, Family, Social reviewed: no change since last visit  ##Shortness of breath/Fatigue/Emphysema  - Continue oxygen at night  -Stop tudorza due to expense  - Please start spiriva 1 puff daily - take sample, script and show technique  - - I have discussed and referred you to pulmonary rehab  #Pulmonary nodule  If you remember this was noted April 2012 and is stable compared in PET [facility] APril 2013 at [facility] I will now follow this based on scans done at [facility] instead of doing CT here  #FOllowup  -6 months or sooner if needed    OV 01/07/2013  Followup dyspnea due to mild emphysema and deconditioning and diastolic dysfunction  Last visit was in March 2014. We switched her anti-cholinergic to Spiriva which seems to be working well for her. She attend  pulmonary rehabilitation and this is also helped her dyspnea (though she told my CMA she was no better). There has been no admission for respirator exacerbations. There are some good days and bad days but overall stable. She has had a flu shot.  .This was a pulmonary nodule in the right lower lobe diagnosed at our placed in April 2012. This is 4-5 mm in size but in the setting of GYN malignancy. She is following with scans at [facility] for the GYN malignancy. However now the daughter says that it is been requested that I follow the nodules. Last scan was a pet CT done in April 2013 at [facility] and this was stable at that time. She is due for another scan now   OV 01/17/2014  Chief Complaint  Patient presents with  . Follow-up    Pt states her breathing is unchanged since  last OV. Pt wearing 2lpm of O2 at night. Pt c/o DOE, dry cough. Pt denies CP/tightness.    Followup dyspnea due to mild emphysema and deconditioning and diastolic dysfunction: This is one year followup. Presents with Daughter  [name]. Now she tells me that dyspnea persists maybe some worse. Exertional relieved with rest. No cough. Uses spiriva Capsule mdi (not respimat) but not sure is helping but thinks might be worse without it. She has not had flu shot but will have one with [place] this visit. Walking deaturation test : 185 feet  X 3 laps: did not drop below 98%.   Lung nodule - 17mm April 2015 and Sept 2015: needs repeat CT chest sometime now  SMoking:  reports that she has been passively smoking.  She has never used smokeless tobacco.    Review of Systems  Constitutional: Negative for fever and unexpected weight change.  HENT: Negative for congestion, dental problem, ear pain, nosebleeds, postnasal drip, rhinorrhea, sinus pressure, sneezing, sore throat and trouble swallowing.   Eyes: Negative for redness and itching.  Respiratory: Positive for cough and shortness of breath. Negative for chest tightness and wheezing.     Cardiovascular: Negative for palpitations and leg swelling.  Gastrointestinal: Negative for nausea and vomiting.  Genitourinary: Negative for dysuria.  Musculoskeletal: Negative for joint swelling.  Skin: Negative for rash.  Neurological: Negative for headaches.  Hematological: Does not bruise/bleed easily.  Psychiatric/Behavioral: Negative for dysphoric mood. The patient is not nervous/anxious.    Current outpatient prescriptions:acetaminophen (TYLENOL) 500 MG tablet, Take 500 mg by mouth every 6 (six) hours as needed for mild pain., Disp: , Rfl: ;  bisoprolol (ZEBETA) 5 MG tablet, Take 5 mg by mouth every morning., Disp: , Rfl: ;  Calcium Carb-Cholecalciferol (CALCIUM 600 + D) 600-200 MG-UNIT TABS, Take 1 tablet by mouth 2 (two) times daily., Disp: , Rfl:  diltiazem (TIAZAC) 240 MG 24 hr capsule, TAKE ONE CAPSULE BY MOUTH EVERY EVENING, Disp: 30 capsule, Rfl: 1;  furosemide (LASIX) 40 MG tablet, TAKE 1 TABLET EVERY MORNING ,AND TAKE 1/2 TABLET EVERY EVENING, Disp: 45 tablet, Rfl: 0;  iron polysaccharides (FERREX 150) 150 MG capsule, Take 150 mg by mouth 2 (two) times daily., Disp: , Rfl: ;  levothyroxine (SYNTHROID, LEVOTHROID) 50 MCG tablet, Take 50 mcg by mouth daily before breakfast., Disp: , Rfl:  LORazepam (ATIVAN) 0.5 MG tablet, Take 1 tablet by mouth as needed., Disp: , Rfl: ;  nitroGLYCERIN (NITROSTAT) 0.4 MG SL tablet, Place 0.4 mg under the tongue every 5 (five) minutes as needed for chest pain. , Disp: , Rfl: ;  potassium chloride SA (K-DUR,KLOR-CON) 20 MEQ tablet, Take 20 mEq by mouth 2 (two) times daily., Disp: , Rfl: ;  simvastatin (ZOCOR) 40 MG tablet, Take 40 mg by mouth daily., Disp: , Rfl:  Tiotropium Bromide Monohydrate (SPIRIVA RESPIMAT) 2.5 MCG/ACT AERS, Inhale 2 puffs into the lungs daily., Disp: , Rfl:       Objective:   Physical Exam   Filed Vitals:   01/17/14 1132  BP: 120/70  Pulse: 66  Height: 5\' 2"  (1.575 m)  Weight: 122 lb 12.8 oz (55.702 kg)  SpO2: 98%   Vitals reviewed. Constitutional: She is oriented to person, place, and time. She appears well-developed and well-nourished. No distress.  HENT:  Head: Normocephalic and atraumatic.  Right Ear: External ear normal.  Left Ear: External ear normal.  Mouth/Throat: Oropharynx is clear and moist. No oropharyngeal exudate.  Eyes: Conjunctivae and EOM are normal. Pupils are  equal, round, and reactive to light. Right eye exhibits no discharge. Left eye exhibits no discharge. No scleral icterus.  Neck: Normal range of motion. Neck supple. No JVD present. No tracheal deviation present. No thyromegaly present.  Cardiovascular: Normal rate, regular rhythm, normal heart sounds and intact distal pulses.  Exam reveals no gallop and no friction rub.   No murmur heard. Pulmonary/Chest: Effort normal and breath sounds normal. No respiratory distress. She has no wheezes. She has no rales. She exhibits no tenderness.  Abdominal: Soft. Bowel sounds are normal. She exhibits no distension and no mass. There is no tenderness. There is no rebound and no guarding.  Musculoskeletal: Normal range of motion. She exhibits no edema and no tenderness.  Lymphadenopathy:    She has no cervical adenopathy.  Neurological: She is alert and oriented to person, place, and time. She has normal reflexes. No cranial nerve deficit. She exhibits normal muscle tone. Coordination normal.    Skin: Skin is warm and dry. No rash noted. She is not diaphoretic. No erythema. No pallor.  Psychiatric: She has a normal mood and affect. Her behavior is normal. Judgment and thought content normal.       Assessment & Plan:  ##Shortness of breath/Fatigue/Emphysema - unclear why worse : poissibly heart muscle relaxation issue or fatigue or spiriva not getting in enough or spiriva non-compliance  - Continue oxygen at night  -Please cchange spiriva mdi to respimat and see if this helps with shortness of breath  - HAve flu shot 01/17/2014 - Call me  in 2 weeks to give response to new spiriva: depending on respoonse can consider new inhaler like STIOLTO or home PT  #Pulmonary nodule  First noted April 2012 and is stable on PET scan April 2013 (75mm)  at [facility] and Sept 2015 at [facility]  - repeat ct chest wo contrast now as followup; do it at [facility] - will inform you of results   #FOllowup - daughter [name] to call office [phone] in 2 weeks  -12 months or sooner if needed

## 2014-01-17 NOTE — Patient Instructions (Addendum)
##  Shortness of breath/Fatigue/Emphysema - unclear why worse : poissibly heart muscle relaxation issue or fatigue or spiriva not getting in enough  - Continue oxygen at night  -Please cchange spiriva mdi to respimat  - HAve flu shot 01/17/2014 - Call me in 2 weeks to give response to new spiriva: depending on respoonse can consider new inhaler like STIOLTO or home PT  #Pulmonary nodule  First noted April 2012 and is stable on PET scan April 2013 (79mm)  at Serenity Springs Specialty Hospital and Sept 2015 at Select Specialty Hospital Arizona Inc.  - repeat ct chest wo contrast now as followup; do it at Ambulatory Surgery Center Of Niagara - will inform you of results   #FOllowup - daughter Vivien Rota to call office 547 1801 in 2 weeks  -12 months or sooner if needed

## 2014-01-21 ENCOUNTER — Ambulatory Visit (INDEPENDENT_AMBULATORY_CARE_PROVIDER_SITE_OTHER)
Admission: RE | Admit: 2014-01-21 | Discharge: 2014-01-21 | Disposition: A | Discharge status: Home / Self Care | Payer: Medicare Other | Source: Ambulatory Visit | Attending: Internal Medicine | Admitting: Internal Medicine

## 2014-01-21 DIAGNOSIS — R0602 Shortness of breath: Secondary | ICD-10-CM

## 2014-01-24 DIAGNOSIS — Z23 Encounter for immunization: Secondary | ICD-10-CM | POA: Insufficient documentation

## 2014-01-24 DIAGNOSIS — R911 Solitary pulmonary nodule: Secondary | ICD-10-CM | POA: Insufficient documentation

## 2014-01-27 ENCOUNTER — Telehealth: Payer: Self-pay | Admitting: Internal Medicine

## 2014-01-27 NOTE — Telephone Encounter (Signed)
Notes Recorded by Brand Males, MD on 01/21/2014 at 5:44 PM Let daiughter / patient know that nodule is unchanged since 2012 and so n future might not do repeat scans. eMphysema present as before    Called and spoke to pt's daughter. Informed daughter of results and recs per MR. Daughter verbalized understanding and denied any further questions or concerns at this time.

## 2014-01-27 NOTE — Progress Notes (Signed)
Quick Note:  Called and spoke to pt's daughter. Informed daughter of results and recs per MR. Daughter verbalized understanding and denied any further questions or concerns at this time. ______

## 2014-02-01 ENCOUNTER — Other Ambulatory Visit: Payer: Self-pay | Admitting: Cardiology

## 2014-02-01 ENCOUNTER — Telehealth: Payer: Self-pay | Admitting: Internal Medicine

## 2014-02-01 NOTE — Telephone Encounter (Signed)
Pt last seen 9.21.15 by MR: Patient Instructions      ##Shortness of breath/Fatigue/Emphysema - unclear why worse : poissibly heart muscle relaxation issue or fatigue or spiriva not getting in enough  - Continue oxygen at night  -Please cchange spiriva mdi to respimat  - HAve flu shot 01/17/2014 - Call me in 2 weeks to give response to new spiriva: depending on respoonse can consider new inhaler like STIOLTO or home PT  #Pulmonary nodule  First noted April 2012 and is stable on PET scan April 2013 (30mm)  at [facility] and Sept 2015 at [facility]  - repeat ct chest wo contrast now as followup; do it at [facility] - will inform you of results   #FOllowup - daughter Vivien Rota to call office [phone] in 2 weeks  -12 months or sooner if needed   ~~~~~~~~~~~~~~~~~~~~~~~~~~~~~ [facility] TCB x1 for pt's daughter Vivien Rota

## 2014-02-02 NOTE — Telephone Encounter (Signed)
okl please do spiriva respimat script and refills. Tell them if expensive or denials, we will do PA

## 2014-02-02 NOTE — Telephone Encounter (Signed)
Pt's daughter returned call & can be reached at [phone].  [name]

## 2014-02-02 NOTE — Telephone Encounter (Signed)
Spoke with pt Daughter Vivien Rota, aware of rec's again per last OV Daughter states that Spiriva Respimat has worked great-- would like further rec's Please advise Dr Chase Caller. Thanks.

## 2014-02-03 MED ORDER — TIOTROPIUM BROMIDE MONOHYDRATE 2.5 MCG/ACT IN AERS: 2.0000 | INHALATION_SPRAY | Freq: Every day | RESPIRATORY_TRACT | Status: DC

## 2014-02-03 NOTE — Telephone Encounter (Signed)
Called and spoke with [facility] and she is aware of MR recs.  rx for the spiriva has been sent to the pharmacy and Vivien Rota is aware. Nothing further is needed.

## 2014-02-04 ENCOUNTER — Encounter: Payer: Self-pay | Admitting: *Deleted

## 2014-02-04 ENCOUNTER — Other Ambulatory Visit: Payer: Self-pay | Admitting: *Deleted

## 2014-02-08 NOTE — Telephone Encounter (Signed)
error 

## 2014-02-09 ENCOUNTER — Other Ambulatory Visit: Payer: Self-pay | Admitting: Cardiology

## 2014-02-10 ENCOUNTER — Ambulatory Visit (INDEPENDENT_AMBULATORY_CARE_PROVIDER_SITE_OTHER): Payer: Medicare Other | Admitting: Cardiology

## 2014-02-10 ENCOUNTER — Encounter: Payer: Self-pay | Admitting: Cardiology

## 2014-02-10 VITALS — BP 118/56 | HR 58 | Ht 62.0 in | Wt 124.0 lb

## 2014-02-10 DIAGNOSIS — I480 Paroxysmal atrial fibrillation: Secondary | ICD-10-CM

## 2014-02-10 DIAGNOSIS — I10 Essential (primary) hypertension: Secondary | ICD-10-CM

## 2014-02-10 DIAGNOSIS — K627 Radiation proctitis: Secondary | ICD-10-CM

## 2014-02-10 LAB — CBC WITH DIFFERENTIAL/PLATELET
BASOS ABS: 0 10*3/uL (ref 0.0–0.1)
BASOS PCT: 0.6 % (ref 0.0–3.0)
EOS PCT: 3 % (ref 0.0–5.0)
Eosinophils Absolute: 0.1 10*3/uL (ref 0.0–0.7)
HEMATOCRIT: 30.8 % — AB (ref 36.0–46.0)
Hemoglobin: 10.2 g/dL — ABNORMAL LOW (ref 12.0–15.0)
LYMPHS ABS: 0.9 10*3/uL (ref 0.7–4.0)
LYMPHS PCT: 18.3 % (ref 12.0–46.0)
MCHC: 33.1 g/dL (ref 30.0–36.0)
MCV: 87 fl (ref 78.0–100.0)
Monocytes Absolute: 0.6 10*3/uL (ref 0.1–1.0)
Monocytes Relative: 13 % — ABNORMAL HIGH (ref 3.0–12.0)
NEUTROS ABS: 3.2 10*3/uL (ref 1.4–7.7)
Neutrophils Relative %: 65.1 % (ref 43.0–77.0)
Platelets: 197 10*3/uL (ref 150.0–400.0)
RBC: 3.55 Mil/uL — AB (ref 3.87–5.11)
RDW: 14.2 % (ref 11.5–15.5)
WBC: 4.9 10*3/uL (ref 4.0–10.5)

## 2014-02-10 LAB — BASIC METABOLIC PANEL
BUN: 31 mg/dL — AB (ref 6–23)
CO2: 35 meq/L — AB (ref 19–32)
CREATININE: 1.7 mg/dL — AB (ref 0.4–1.2)
Calcium: 9.7 mg/dL (ref 8.4–10.5)
Chloride: 103 mEq/L (ref 96–112)
GFR: 29.54 mL/min — ABNORMAL LOW (ref >60.00)
GLUCOSE: 106 mg/dL — AB (ref 70–99)
POTASSIUM: 4.3 meq/L (ref 3.5–5.1)
Sodium: 141 mEq/L (ref 135–145)

## 2014-02-10 LAB — BRAIN NATRIURETIC PEPTIDE: Pro B Natriuretic peptide (BNP): 170 pg/mL — ABNORMAL HIGH (ref 0.0–100.0)

## 2014-02-10 MED ORDER — APIXABAN 2.5 MG PO TABS: 2.5000 mg | ORAL_TABLET | Freq: Two times a day (BID) | ORAL | Status: DC

## 2014-02-10 NOTE — Patient Instructions (Signed)
Take Eliquis 2.5mg  two times a day.  Your physician recommends that you have lab work today--BMET/BNP/CBCd  Your physician wants you to follow-up in: 4 months with Dr Aundra Dubin. (February 2016). You will receive a reminder letter in the mail two months in advance. If you don't receive a letter, please call our office to schedule the follow-up appointment.   You can try nystatin, miconazole, or clotrimazole cream topically for your yeast infection. You can get these without a prescription.

## 2014-02-12 NOTE — Progress Notes (Signed)
Patient ID: [name], female   DOB: 12/05/1923, 80 y.o.   MRN: [id number] PCP: Dr. Reynaldo Minium   80 yo with history of paroxysmal atrial fibrillation, nonobstructive CAD, and chronic exertional dyspnea presents for cardiology followup. Her main cardiopulmonary problem over the last few years has been chronic exertional dyspnea. She has had quite significant shortness of breath for a number of years now.  Patient has had an extensive workup for dyspnea. She had a right and left heart cath in 2/11 that showed nonobstructive CAD as well as normal pulmonary artery pressure and normal wedge pressure. Echo showed normal LV systolic function. She had a V/Q scan in 7/11 with no evidence for PE. She never smoked. She was seen by pulmonology and it was thought that reflux may be a cause of her symptoms. She was started on bid Protonix with some improvement but really minimal. She was in our office in 3/12 for ETT-myoview as a pre-operative test before surgery for endometrial cancer. There was no evidence for ischemia or infarction on perfusion images or ECG but the patient's oxygen saturation dropped from 96% at rest to 83% at peak exertion. She then had a high-resolution chest CT showing no significant interstitial lung disease but there was evidence for emphysema.  PFTs showed normal spirometry but there was decreased DLCO. She saw pulmonology again and it was thought that she could have a component of emphysema from passive smoking. She also had overnight oximetry with significant desaturation and is on oxygen at night. I also started her on Lasix for a probable component of diastolic CHF.  Patient was admitted in 1/15 with painless hematochezia.  INR was 3.89.  She was transfused 2 units pRBCs.  Diverticular bleeding was suspected but no colonoscopy was done.  Coumadin was stopped. She was started back on reduced dose Eliquis, but had rectal bleeding with this and it was stopped in 3/15.  She saw GI and had flex  sigmoidoscopy.  This showed probable radiation proctitis that was treated with APC ablation.  She has had no further rectal bleeding.    Patient continues to have exertional dyspnea.  It is chronic and no worse than in the past.  She is not very active.  She does get short of breath making her bed.  She walks in her house and in her yard without significant dyspnea.  She is able to walk around the block and up 1 flight of steps.  She is wearing oxygen at night.  NSR today, no tachypalpitations.  Weight is down 8 lbs.    ECG: NSR, normal  Labs (4/12): hgb 10.6, LDL 84, HDL 68, BNP 98 Labs (6/12): LDL 78, HDL 67 Labs (2/13): K 4.3, creatinine 1.58, LDL 59, HDL 69, BNP 103 Labs (1/15): HCT 27.4 => 34, K 4.4, creatinine 1.6, BNP 149 Labs (3/15): K 4.1, creatinine 1.7 Labs (6/15): HCT 31.5  PMH:  1. Atrial fibrillation: Paroxysmal. She was on dronedarone briefly but it was stopped. She stopped coumadin after GI bleed.  She had rectal bleeding in 3/15 and stopped Eliquis.  2. CHF: Diastolic. Echo (2/11) with mild LVH, EF 84-69%, PA systolic pressure 35 mmHg.  3. Carotid stenosis: right CEA in 2011.  4. Endometrial cancer s/p hysterectomy 4/12.  She had radiation after this.  5. HTN  6. Anemia  7. GERD/hiatal hernia  8. Hypothyroidism  9. TIA  10. CAD: LHC (2/11) with 40% LAD stenosis, 50% mCFX stenosis. ETT-myoview (3/12): 5:31 exercise, no ECG changes, oxygen  saturation dropped from 96% at rest to 83% with exertion, no ischemia or infarction on perfusion images.  11. Dyspnea with exertion: Extensive workup in 2011. Echo and left heart cath as above. Right heart cath with mean PA pressure 23 mmHg and PCWP 11 mmHg. V/Q scan (7/11) low probability. PFTs (2/11) normal except for mildly decreased DLCO. ETT-myoview as above. O2 sats dropped with exercise. Patient was evaluated by pulmonology, thought to have GERD as cause of dyspnea.  CT chest without contrast (high resolution) in 4/12 showed emphysema  but no interstitial lung disease.  PFTs (4/12) with normal spirometry but decreased DLCO.  Nocturnal oximetry showed desaturation and she is now on oxygen at night.  Cardiopulmonary exercise test suggested a component of diastolic dysfunction.  Dyspnea thought to be multifactorial with emphysema, deconditioning, and diastolic CHF thought to play a role.  12. Pulmonary nodule: Followed by Dr Chase Caller 13. Lower GI bleed (1/15): Suspect diverticular.  14. Radiation proctitis: Treated in 5/15 with APC ablation with no further rectal bleeding.    SH: Married, never smoked, retired. Lives in [place]. Daughter is involved in her care.   FH: CAD   ROS: All systems reviewed and negative except as per HPI.   Current Outpatient Prescriptions  Medication Sig Dispense Refill  . acetaminophen (TYLENOL) 500 MG tablet Take 500 mg by mouth every 6 (six) hours as needed for mild pain.      . bisoprolol (ZEBETA) 5 MG tablet Take 5 mg by mouth every morning.      . Calcium Carb-Cholecalciferol (CALCIUM 600 + D) 600-200 MG-UNIT TABS Take 1 tablet by mouth 2 (two) times daily.      Marland Kitchen diltiazem (TIAZAC) 240 MG 24 hr capsule TAKE ONE CAPSULE BY MOUTH EVERY EVENING  30 capsule  1  . iron polysaccharides (FERREX 150) 150 MG capsule Take 150 mg by mouth 2 (two) times daily.      Marland Kitchen KLOR-CON M20 20 MEQ tablet TAKE 1 TABLET BY MOUTH TWICE A DAY  60 tablet  0  . levothyroxine (SYNTHROID, LEVOTHROID) 50 MCG tablet Take 50 mcg by mouth daily before breakfast.      . LORazepam (ATIVAN) 0.5 MG tablet Take 1 tablet by mouth as needed.      . nitroGLYCERIN (NITROSTAT) 0.4 MG SL tablet Place 0.4 mg under the tongue every 5 (five) minutes as needed for chest pain.       . simvastatin (ZOCOR) 40 MG tablet TAKE 1 TABLET BY MOUTH EVERY EVENING  30 tablet  0  . Tiotropium Bromide Monohydrate (SPIRIVA RESPIMAT) 2.5 MCG/ACT AERS Inhale 2 puffs into the lungs daily.  4 g  6  . apixaban (ELIQUIS) 2.5 MG TABS tablet Take 1 tablet (2.5  mg total) by mouth 2 (two) times daily.  60 tablet  4  . furosemide (LASIX) 40 MG tablet TAKE 1 TABLET EVERY MORNING ,AND TAKE 1/2 TABLET EVERY EVENING  45 tablet  3   No current facility-administered medications for this visit.    BP 118/56  Pulse 58  Ht 5\' 2"  (1.575 m)  Wt 124 lb (56.246 kg)  BMI 22.67 kg/m2 BP 135/65  HR 60 General: NAD  Neck: JVP not elevated, no thyromegaly or thyroid nodule.  Lungs: Clear to auscultation bilaterally with normal respiratory effort.  CV: Nondisplaced PMI. Heart regular S1/S2, no S3/S4, no murmur. No peripheral edema. No carotid bruit. Normal pedal pulses.  Abdomen: Soft, nontender, no hepatosplenomegaly, no distention.  Neurologic: Alert and oriented x 3.  Psych: Normal affect.  Extremities: No clubbing or cyanosis.  Skin: Red rash under left breast  Assessment/Plan:  DYSPNEA Multifactorial and chronic but I suspect that the significant contributors are diastolic CHF, COPD, and nocturnal desaturation. She has done better on Lasix, bronchodilators, and oxygen at night. Cardiopulmonary exercise test in 2012 suggested that diastolic dysfunction is present. She does not appear volume overloaded on exam today.  Continue current Lasix dose.  Symptoms are stable.  Coronary artery disease Nonobstructive on last cath. Continue ASA 81 mg daily and statin.  Carotid stenosis S/p right CEA, follows with [facility].  ATRIAL FIBRILLATION  Paroxysmal. She is in sinus rhythm now. No recent atrial fibrillation-type symptoms. She is on diltiazem CD but stopped coumadin and later Eliquis 2.5 mg bid due to lower GI bleed.  She had a flex sig showing radiation proctitis and underwent APC ablation.  No further bleeding since then.   - I am going to restart her on apixaban 2.5 mg bid (reduced dose with age, size, and creatinine > 1.5) now that radiation proctitis has been treated. - Check CBC and BMET today.  HYPERTENSION   Blood pressure is reasonably controlled.   Hyperlipidemia Goal LDL < 70 with known vascular disease.  She is on simvastatin.   Candida intertrigo: Under left breast.  I recommended treatment with over-the-counter nystatin, miconazole, or clotrimazole.   Followup in 4 months.   Loralie Champagne 02/12/2014

## 2014-02-14 ENCOUNTER — Telehealth: Payer: Self-pay | Admitting: *Deleted

## 2014-02-14 NOTE — Telephone Encounter (Signed)
PA ,[phone] Deanna, obtained for Eliquis 2.5mg  two times a day. [place] [id number] expires 02/15/15.   Antony Haste at [place], [phone] advised.

## 2014-02-21 ENCOUNTER — Other Ambulatory Visit: Payer: Self-pay | Admitting: Cardiology

## 2014-02-28 ENCOUNTER — Encounter: Payer: Self-pay | Admitting: Cardiology

## 2014-03-02 ENCOUNTER — Other Ambulatory Visit: Payer: Self-pay | Admitting: Cardiology

## 2014-03-03 ENCOUNTER — Other Ambulatory Visit: Payer: Self-pay | Admitting: Cardiology

## 2014-03-04 ENCOUNTER — Other Ambulatory Visit: Payer: Self-pay | Admitting: Cardiology

## 2014-04-09 ENCOUNTER — Other Ambulatory Visit: Payer: Self-pay | Admitting: Cardiology

## 2014-06-14 ENCOUNTER — Other Ambulatory Visit: Payer: Self-pay | Admitting: Cardiology

## 2014-06-22 ENCOUNTER — Other Ambulatory Visit: Payer: Self-pay | Admitting: Cardiology

## 2014-07-10 ENCOUNTER — Other Ambulatory Visit: Payer: Self-pay | Admitting: Cardiology

## 2014-07-20 ENCOUNTER — Other Ambulatory Visit: Payer: Self-pay | Admitting: Cardiovascular Disease

## 2014-08-09 ENCOUNTER — Other Ambulatory Visit: Payer: Self-pay | Admitting: Cardiology

## 2014-08-11 ENCOUNTER — Other Ambulatory Visit: Payer: Self-pay | Admitting: Cardiology

## 2014-08-21 ENCOUNTER — Other Ambulatory Visit: Payer: Self-pay | Admitting: Cardiology

## 2014-08-25 ENCOUNTER — Other Ambulatory Visit (HOSPITAL_COMMUNITY): Payer: Self-pay | Admitting: Internal Medicine

## 2014-08-25 DIAGNOSIS — R109 Unspecified abdominal pain: Secondary | ICD-10-CM

## 2014-08-30 ENCOUNTER — Ambulatory Visit (HOSPITAL_COMMUNITY): Payer: Medicare Other

## 2014-08-31 ENCOUNTER — Other Ambulatory Visit: Payer: Self-pay | Admitting: Cardiology

## 2014-09-04 ENCOUNTER — Other Ambulatory Visit: Payer: Self-pay | Admitting: Cardiology

## 2014-09-07 ENCOUNTER — Other Ambulatory Visit (HOSPITAL_COMMUNITY): Payer: Medicare Other

## 2014-09-07 ENCOUNTER — Ambulatory Visit (HOSPITAL_COMMUNITY)
Admission: RE | Admit: 2014-09-07 | Discharge: 2014-09-07 | Disposition: A | Discharge status: Home / Self Care | Payer: Medicare Other | Source: Ambulatory Visit | Attending: Internal Medicine | Admitting: Internal Medicine

## 2014-09-07 DIAGNOSIS — R110 Nausea: Secondary | ICD-10-CM | POA: Diagnosis present

## 2014-09-07 DIAGNOSIS — R109 Unspecified abdominal pain: Secondary | ICD-10-CM | POA: Diagnosis present

## 2014-09-07 MED ORDER — TECHNETIUM TC 99M MEBROFENIN IV KIT
5.0000 | PACK | Freq: Once | INTRAVENOUS | Status: AC | PRN
  Administered 2014-09-07: 5 via INTRAVENOUS

## 2014-09-07 MED ORDER — SINCALIDE 5 MCG IJ SOLR
0.0200 ug/kg | Freq: Once | INTRAMUSCULAR | Status: AC
  Administered 2014-09-07: 1.1 ug via INTRAVENOUS

## 2014-09-12 ENCOUNTER — Encounter: Payer: Self-pay | Admitting: Physician Assistant

## 2014-09-12 ENCOUNTER — Ambulatory Visit (INDEPENDENT_AMBULATORY_CARE_PROVIDER_SITE_OTHER): Payer: Medicare Other | Admitting: Physician Assistant

## 2014-09-12 VITALS — BP 124/58 | HR 63 | Ht 62.0 in | Wt 118.0 lb

## 2014-09-12 DIAGNOSIS — R0602 Shortness of breath: Secondary | ICD-10-CM

## 2014-09-12 DIAGNOSIS — I10 Essential (primary) hypertension: Secondary | ICD-10-CM

## 2014-09-12 DIAGNOSIS — I480 Paroxysmal atrial fibrillation: Secondary | ICD-10-CM

## 2014-09-12 DIAGNOSIS — I5032 Chronic diastolic (congestive) heart failure: Secondary | ICD-10-CM

## 2014-09-12 MED ORDER — APIXABAN 2.5 MG PO TABS: 2.5000 mg | ORAL_TABLET | Freq: Two times a day (BID) | ORAL | Status: DC

## 2014-09-12 NOTE — Progress Notes (Signed)
Cardiology Office Note   Date:  09/12/2014   ID:  JERZEE JEROME, DOB [birth year], MRN [id number]  PCP:  Geoffery Lyons, MD  Cardiologist:  Loralie Champagne, M.D. Chief Complaint: Shortness of breath    History of Present Illness: [name] is a 81 y.o. female who presents for 4 month follow-up. She has COPD on home oxygen at night, diastolic heart failure, nonobstructive CAD on cath in 2011, Myoview in 2012 showed no ischemia or infarction. or excisional atrial fibrillation on diltiazem. She was previously on Coumadin but it was switched to Eliquis after lower GI bleed. She had radiation proctitis and underwent APC ablation. She also has hypertension hyperlipidemia.  Patient's main complaint today is dyspnea on exertion. She was able to walk a whole block last week which is unusual for her. She also has been having lower abdominal pain on the left side being worked up by Dr. Joya Salm. NM Hepato was normal. Pain is usually when she first wakes up in the morning and resolves the rest of the day. She has no associated nausea, diarrhea or constipation. She denies any bleeding. She did vomit twice. She has chronic left hip pain and knee pain on that same side. Her husband has dementia and they now have a caregiver with him at night.  Past Medical History  Diagnosis Date  . TIA (transient ischemic attack) yrs ago  . Unspecified hypothyroidism   . Unspecified essential hypertension   . Allergic rhinitis, cause unspecified   . Hiatal hernia   . SOB (shortness of breath)     unexplained SOB onset fall 2010. PFT normal 06-12-09. Right heatrt Cath 06-26-09 nl PA but wedge 23 with non obst CAD, echo same day: Left ventricle: the cavity size was normal. Wall thickness was increased in a pattern of mild LVH. Systolic function was vigorous. Estimated ejection fraction 65% to 70%. Left atrium mildly dilated. nml walking sats 11-17-2009  . Hyperlipidemia   . Arrhythmia atrial fib  . Carotid artery  occlusion     carotid endarterectomy     . Atrial fibrillation     paroxymal  . Carcinoma of uterus Feb. 2012    High-grade serous stage Ia/vaginal cuff recurrence July 2012  . Osteoporosis   . COPD (chronic obstructive pulmonary disease)   . History of blood transfusion 08-26-13    1 unit given 08-26-13, 3 units given jan 2015  . Rectal bleeding 05-12-13    in hospital 4 days  . Diverticulitis   . Coronary artery disease   . History of home oxygen therapy     uses oxygen 2 liters per nasal cannula at hs  . Anemia   . Stroke     Mini    Past Surgical History  Procedure Laterality Date  . Tubal ligation  1970  . Carotid endarterectomy  08/04/2009    Right  CEA  . Internal radiation  June 13,2013 and  October 24, 2011    5 dose by Dr. Belva Bertin  . External radiation  May 7,2013- October 07, 2011    25 treatments   . Carotid endarterectomy    . Abdominal hysterectomy  April 20,2012    by Dr. Rhodia Albright at  [facility]  . Cholecystectomy  yrs ago  . Flexible sigmoidoscopy N/A 09/02/2013    Procedure: FLEXIBLE SIGMOIDOSCOPY;  Surgeon: Jerene Bears, MD;  Location: [facility];  Service: Gastroenterology;  Laterality: N/A;     Current Outpatient Prescriptions  Medication Sig Dispense Refill  .  acetaminophen (TYLENOL) 500 MG tablet Take 500 mg by mouth every 6 (six) hours as needed for mild pain.    . bisoprolol (ZEBETA) 5 MG tablet Take 5 mg by mouth every morning.    . Calcium Carb-Cholecalciferol (CALCIUM 600 + D) 600-200 MG-UNIT TABS Take 1 tablet by mouth 2 (two) times daily.    Marland Kitchen diltiazem (CARDIZEM CD) 240 MG 24 hr capsule TAKE ONE CAPSULE BY MOUTH EVERY EVENING 30 capsule 0  . diltiazem (DILACOR XR) 240 MG 24 hr capsule TAKE ONE CAPSULE BY MOUTH EVERY EVENING 30 capsule 1  . ELIQUIS 2.5 MG TABS tablet TAKE 1 TABLET BY MOUTH TWICE A DAY 60 tablet 0  . furosemide (LASIX) 40 MG tablet TAKE 1 TABLET EVERY MORNING ,AND TAKE 1/2 TABLET EVERY EVENING 45 tablet 3  . iron polysaccharides  (FERREX 150) 150 MG capsule Take 150 mg by mouth 2 (two) times daily.    Marland Kitchen KLOR-CON M20 20 MEQ tablet TAKE 1 TABLET BY MOUTH TWICE A DAY 60 tablet 0  . levothyroxine (SYNTHROID, LEVOTHROID) 50 MCG tablet Take 50 mcg by mouth daily before breakfast.    . LORazepam (ATIVAN) 0.5 MG tablet Take 1 tablet by mouth at bedtime as needed (for sleep).     . nitroGLYCERIN (NITROSTAT) 0.4 MG SL tablet Place 0.4 mg under the tongue every 5 (five) minutes as needed for chest pain.     . simvastatin (ZOCOR) 40 MG tablet TAKE 1 TABLET BY MOUTH EVERY EVENING 30 tablet 0  . Tiotropium Bromide Monohydrate (SPIRIVA RESPIMAT) 2.5 MCG/ACT AERS Inhale 2 puffs into the lungs daily. 4 g 6  . [DISCONTINUED] diltiazem (TIAZAC) 240 MG 24 hr capsule TAKE ONE CAPSULE BY MOUTH EVERY EVENING 30 capsule 1   No current facility-administered medications for this visit.    Allergies:   Albuterol    Social History:  The patient  reports that she has been passively smoking.  She has never used smokeless tobacco. She reports that she does not drink alcohol or use illicit drugs.   Family History:  The patient's   family history includes Breast cancer (age of onset: 63) in her daughter; COPD in her sister; Cancer in her brother; Coronary artery disease in her other; Heart attack in her brother, daughter, and son; Heart disease in her brother, daughter, and mother; Heart disease (age of onset: 69) in her sister; Hypertension in her brother, mother, and sister; Lung cancer in her sister; Prostate cancer in her son; Stroke in her mother.    ROS:  Please see the history of present illness.   Otherwise, review of systems are positive for none.   All other systems are reviewed and negative.    PHYSICAL EXAM: VS:  BP 124/58 mmHg  Pulse 63  Ht 5\' 2"  (1.575 m)  Wt 118 lb (53.524 kg)  BMI 21.58 kg/m2 , BMI Body mass index is 21.58 kg/(m^2). GEN: Well nourished, well developed, in no acute distress Neck: no JVD, HJR, carotid bruits, or  masses Cardiac: RRR; 1/6 systolic murmur at the left sternal border, no gallop, rubs, thrill or heave,  Respiratory:  Decreased breath sounds but clear to auscultation bilaterally, normal work of breathing GI: soft, nontender, nondistended, + BS MS: no deformity or atrophy Extremities: without cyanosis, clubbing, edema, good distal pulses bilaterally.  Skin: warm and dry, no rash Neuro:  Strength and sensation are intact    EKG:  EKG is ordered today. The ekg ordered today demonstrates normal sinus rhythm at  63 bpm, PAC, in no acute change   Recent Labs: 02/10/2014: BUN 31*; Creatinine 1.7*; Hemoglobin 10.2*; Platelets 197.0; Potassium 4.3; Pro B Natriuretic peptide (BNP) 170.0*; Sodium 141    Lipid Panel    Component Value Date/Time   CHOL 143 03/05/2012 0918   TRIG 137.0 03/05/2012 0918   HDL 53.40 03/05/2012 0918   CHOLHDL 3 03/05/2012 0918   VLDL 27.4 03/05/2012 0918   LDLCALC 62 03/05/2012 0918      Wt Readings from Last 3 Encounters:  09/12/14 118 lb (53.524 kg)  02/10/14 124 lb (56.246 kg)  01/17/14 122 lb 12.8 oz (55.702 kg)      Other studies Reviewed: Additional studies/ records that were reviewed today include and review of the records demonstrates:   Myoview 2012Impression Exercise Capacity:  Good exercise capacity. BP Response:  Normal blood pressure response. Clinical Symptoms:  No chest pain. ECG Impression:  No significant ST segment change suggestive of ischemia. Comparison with Prior Nuclear Study: No previous nuclear study performed  Overall Impression:  Normal stress nuclear study.  Carotid Dopplers 12/21/13 less than 40% stenosis. No change from 2014.  ASSESSMENT AND PLAN:  DIASTOLIC HEART FAILURE, CHRONIC Compensated. Continue low-dose Lasix   Atrial fibrillation In normal sinus rhythm today. On Eliquis and diltiazem.   DYSPNEA Patient has chronic dyspnea wearing oxygen at night. She was able to walk a block last week which is  unusual for her. No significant change since last office visit. Follow-up with Dr. Aundra Dubin in 4-5 months.     Sumner Boast, PA-C  09/12/2014 12:59 PM    [facility] [address], [place], [place]  [address] Phone: [phone]; Fax: [phone]

## 2014-09-12 NOTE — Assessment & Plan Note (Signed)
Compensated. Continue low-dose Lasix

## 2014-09-12 NOTE — Assessment & Plan Note (Signed)
In normal sinus rhythm today. On Eliquis and diltiazem.

## 2014-09-12 NOTE — Assessment & Plan Note (Signed)
Patient has chronic dyspnea wearing oxygen at night. She was able to walk a block last week which is unusual for her. No significant change since last office visit. Follow-up with Dr. Aundra Dubin in 4-5 months.

## 2014-09-12 NOTE — Patient Instructions (Signed)
Medication Instructions:  None  Labwork: None  Testing/Procedures: None  Follow-Up: Your physician wants you to follow-up in: 6 months with Dr. Aundra Dubin. You will receive a reminder letter in the mail two months in advance. If you don't receive a letter, please call our office to schedule the follow-up appointment.   Any Other Special Instructions Will Be Listed Below (If Applicable).

## 2014-09-15 ENCOUNTER — Ambulatory Visit (HOSPITAL_COMMUNITY): Admission: RE | Admit: 2014-09-15 | Payer: Medicare Other | Source: Ambulatory Visit

## 2014-09-16 ENCOUNTER — Other Ambulatory Visit: Payer: Self-pay | Admitting: Cardiology

## 2014-10-03 ENCOUNTER — Other Ambulatory Visit: Payer: Self-pay | Admitting: Cardiology

## 2014-12-22 ENCOUNTER — Emergency Department (HOSPITAL_BASED_OUTPATIENT_CLINIC_OR_DEPARTMENT_OTHER)
Admission: EM | Admit: 2014-12-22 | Discharge: 2014-12-22 | Disposition: A | Discharge status: Home / Self Care | Payer: Medicare Other | Attending: Emergency Medicine | Admitting: Emergency Medicine

## 2014-12-22 ENCOUNTER — Encounter (HOSPITAL_BASED_OUTPATIENT_CLINIC_OR_DEPARTMENT_OTHER): Payer: Self-pay

## 2014-12-22 ENCOUNTER — Emergency Department (HOSPITAL_BASED_OUTPATIENT_CLINIC_OR_DEPARTMENT_OTHER): Payer: Medicare Other

## 2014-12-22 DIAGNOSIS — Y998 Other external cause status: Secondary | ICD-10-CM | POA: Diagnosis not present

## 2014-12-22 DIAGNOSIS — I2510 Atherosclerotic heart disease of native coronary artery without angina pectoris: Secondary | ICD-10-CM | POA: Insufficient documentation

## 2014-12-22 DIAGNOSIS — D649 Anemia, unspecified: Secondary | ICD-10-CM | POA: Insufficient documentation

## 2014-12-22 DIAGNOSIS — Z8719 Personal history of other diseases of the digestive system: Secondary | ICD-10-CM | POA: Diagnosis not present

## 2014-12-22 DIAGNOSIS — Y9289 Other specified places as the place of occurrence of the external cause: Secondary | ICD-10-CM | POA: Diagnosis not present

## 2014-12-22 DIAGNOSIS — E039 Hypothyroidism, unspecified: Secondary | ICD-10-CM | POA: Insufficient documentation

## 2014-12-22 DIAGNOSIS — W1839XA Other fall on same level, initial encounter: Secondary | ICD-10-CM | POA: Diagnosis not present

## 2014-12-22 DIAGNOSIS — S3991XA Unspecified injury of abdomen, initial encounter: Secondary | ICD-10-CM | POA: Insufficient documentation

## 2014-12-22 DIAGNOSIS — Y9389 Activity, other specified: Secondary | ICD-10-CM | POA: Diagnosis not present

## 2014-12-22 DIAGNOSIS — E785 Hyperlipidemia, unspecified: Secondary | ICD-10-CM | POA: Diagnosis not present

## 2014-12-22 DIAGNOSIS — Z8673 Personal history of transient ischemic attack (TIA), and cerebral infarction without residual deficits: Secondary | ICD-10-CM | POA: Diagnosis not present

## 2014-12-22 DIAGNOSIS — Z9981 Dependence on supplemental oxygen: Secondary | ICD-10-CM | POA: Diagnosis not present

## 2014-12-22 DIAGNOSIS — S299XXA Unspecified injury of thorax, initial encounter: Secondary | ICD-10-CM | POA: Diagnosis not present

## 2014-12-22 DIAGNOSIS — I10 Essential (primary) hypertension: Secondary | ICD-10-CM | POA: Diagnosis not present

## 2014-12-22 DIAGNOSIS — R0781 Pleurodynia: Secondary | ICD-10-CM

## 2014-12-22 DIAGNOSIS — M810 Age-related osteoporosis without current pathological fracture: Secondary | ICD-10-CM | POA: Insufficient documentation

## 2014-12-22 DIAGNOSIS — J449 Chronic obstructive pulmonary disease, unspecified: Secondary | ICD-10-CM | POA: Diagnosis not present

## 2014-12-22 DIAGNOSIS — Z8542 Personal history of malignant neoplasm of other parts of uterus: Secondary | ICD-10-CM | POA: Diagnosis not present

## 2014-12-22 DIAGNOSIS — Z79899 Other long term (current) drug therapy: Secondary | ICD-10-CM | POA: Diagnosis not present

## 2014-12-22 LAB — BASIC METABOLIC PANEL
ANION GAP: 7 (ref 5–15)
BUN: 28 mg/dL — ABNORMAL HIGH (ref 6–20)
CALCIUM: 9.3 mg/dL (ref 8.9–10.3)
CO2: 31 mmol/L (ref 22–32)
Chloride: 101 mmol/L (ref 101–111)
Creatinine, Ser: 1.71 mg/dL — ABNORMAL HIGH (ref 0.44–1.00)
GFR, EST AFRICAN AMERICAN: 30 mL/min — AB (ref >60)
GFR, EST NON AFRICAN AMERICAN: 26 mL/min — AB (ref >60)
GLUCOSE: 114 mg/dL — AB (ref 65–99)
POTASSIUM: 4.1 mmol/L (ref 3.5–5.1)
SODIUM: 139 mmol/L (ref 135–145)

## 2014-12-22 LAB — CBC WITH DIFFERENTIAL/PLATELET
BASOS ABS: 0 10*3/uL (ref 0.0–0.1)
BASOS PCT: 0 % (ref 0–1)
EOS ABS: 0.1 10*3/uL (ref 0.0–0.7)
EOS PCT: 3 % (ref 0–5)
HCT: 30.5 % — ABNORMAL LOW (ref 36.0–46.0)
Hemoglobin: 9.9 g/dL — ABNORMAL LOW (ref 12.0–15.0)
Lymphocytes Relative: 17 % (ref 12–46)
Lymphs Abs: 0.7 10*3/uL (ref 0.7–4.0)
MCH: 29.1 pg (ref 26.0–34.0)
MCHC: 32.5 g/dL (ref 30.0–36.0)
MCV: 89.7 fL (ref 78.0–100.0)
MONO ABS: 0.6 10*3/uL (ref 0.1–1.0)
Monocytes Relative: 14 % — ABNORMAL HIGH (ref 3–12)
Neutro Abs: 2.8 10*3/uL (ref 1.7–7.7)
Neutrophils Relative %: 66 % (ref 43–77)
PLATELETS: 181 10*3/uL (ref 150–400)
RBC: 3.4 MIL/uL — AB (ref 3.87–5.11)
RDW: 13.1 % (ref 11.5–15.5)
WBC: 4.3 10*3/uL (ref 4.0–10.5)

## 2014-12-22 MED ORDER — HYDROCODONE-ACETAMINOPHEN 5-325 MG PO TABS
1.0000 | ORAL_TABLET | Freq: Once | ORAL | Status: AC
  Administered 2014-12-22: 1 via ORAL
  Filled 2014-12-22: qty 1

## 2014-12-22 MED ORDER — DOCUSATE SODIUM 100 MG PO CAPS: 100.0000 mg | ORAL_CAPSULE | Freq: Two times a day (BID) | ORAL | Status: DC

## 2014-12-22 MED ORDER — HYDROCODONE-ACETAMINOPHEN 5-325 MG PO TABS: 1.0000 | ORAL_TABLET | Freq: Four times a day (QID) | ORAL | Status: DC | PRN

## 2014-12-22 NOTE — ED Notes (Addendum)
Dr. Maryan Rued exited pt's room and informed RN that pt has not been anticoagulated for some time now. Upon entering room, pt and family told Roselyn Reef, RN that the pt in fact is taking an anticoagulant. Dr. Maryan Rued informed, no new orders at this time.

## 2014-12-22 NOTE — ED Notes (Signed)
Patient here after experiencing 2 falls last night. Reports that she fell on carpet landing on right side. Reports that she was staggering around room. After getting up fell again in hall while attempting to get back to bed, no loc.

## 2014-12-22 NOTE — ED Provider Notes (Addendum)
CSN: [id number]     Arrival date & time 12/22/14  1039 History   First MD Initiated Contact with Patient 12/22/14 1043     Chief Complaint  Patient presents with  . Fall     (Consider location/radiation/quality/duration/timing/severity/associated sxs/prior Treatment) HPI Comments: Patient is an 81 year old female with a history of TIA, chronic shortness of breath from COPD, atrial fibrillation not on Eliquis, coronary artery disease and home oxygen therapy at night who presents after 2 falls around 1 AM this morning. Patient states she was lying on the couch reading a book when she got up she stumbled causing her to fall. She landed on her left side on the carpet. She denies hitting any objects, head injury or LOC. She got up quickly after the fall stumbled a second time and fell in the hallway. She landed on the right side that time. She was unable to stand up and walk to her bedroom and went to bed. This morning since being awake she's had severe right-sided rib pain. She has chronic shortness of breath but states that it's a little worse since the pain. She denies a headache, loss of consciousness or syncope. During and after the events she denies any dizziness, vertiginous symptoms, lightheadedness, nausea, vomiting, palpitations, chest pain.    No unilateral numbness or weakness in the extremities. She has been able to ambulate this morning without difficulty but just has pain.  Patient is a 81 y.o. female presenting with fall. The history is provided by the patient and a relative.  Fall This is a new problem. The current episode started 6 to 12 hours ago. The problem occurs constantly. The problem has not changed since onset.Associated symptoms include chest pain. Pertinent negatives include no abdominal pain and no headaches. The symptoms are aggravated by bending and coughing (Taking deep breaths). Nothing relieves the symptoms. She has tried nothing for the symptoms.    Past Medical History   Diagnosis Date  . TIA (transient ischemic attack) yrs ago  . Unspecified hypothyroidism   . Unspecified essential hypertension   . Allergic rhinitis, cause unspecified   . Hiatal hernia   . SOB (shortness of breath)     unexplained SOB onset fall 2010. PFT normal 06-12-09. Right heatrt Cath 06-26-09 nl PA but wedge 23 with non obst CAD, echo same day: Left ventricle: the cavity size was normal. Wall thickness was increased in a pattern of mild LVH. Systolic function was vigorous. Estimated ejection fraction 65% to 70%. Left atrium mildly dilated. nml walking sats 11-17-2009  . Hyperlipidemia   . Arrhythmia atrial fib  . Carotid artery occlusion     carotid endarterectomy     . Atrial fibrillation     paroxymal  . Carcinoma of uterus Feb. 2012    High-grade serous stage Ia/vaginal cuff recurrence July 2012  . Osteoporosis   . COPD (chronic obstructive pulmonary disease)   . History of blood transfusion 08-26-13    1 unit given 08-26-13, 3 units given jan 2015  . Rectal bleeding 05-12-13    in hospital 4 days  . Diverticulitis   . Coronary artery disease   . History of home oxygen therapy     uses oxygen 2 liters per nasal cannula at hs  . Anemia   . Stroke     Mini   Past Surgical History  Procedure Laterality Date  . Tubal ligation  1970  . Carotid endarterectomy  08/04/2009    Right  CEA  . Internal  radiation  June 13,2013 and  October 24, 2011    5 dose by Dr. Belva Bertin  . External radiation  May 7,2013- October 07, 2011    25 treatments   . Carotid endarterectomy    . Abdominal hysterectomy  April 20,2012    by Dr. Rhodia Albright at  [facility]  . Cholecystectomy  yrs ago  . Flexible sigmoidoscopy N/A 09/02/2013    Procedure: FLEXIBLE SIGMOIDOSCOPY;  Surgeon: Jerene Bears, MD;  Location: [facility];  Service: Gastroenterology;  Laterality: N/A;   Family History  Problem Relation Age of Onset  . Heart disease Mother   . Hypertension Mother   . Stroke Mother   . Heart disease Sister  85    Heart Disease before age 25  . Lung cancer Sister   . COPD Sister   . Hypertension Sister   . Heart disease Brother     Before age 21  . Cancer Brother   . Hypertension Brother   . Heart attack Brother   . Coronary artery disease Other   . Breast cancer Daughter 40  . Heart attack Daughter   . Heart disease Daughter     Before age 3  . Prostate cancer Son   . Heart attack Son    Social History  Substance Use Topics  . Smoking status: Passive Smoke Exposure - Never Smoker  . Smokeless tobacco: Never Used  . Alcohol Use: No   OB History    Gravida Para Term Preterm AB TAB SAB Ectopic Multiple Living   7 7 7       7      Review of Systems  Cardiovascular: Positive for chest pain.  Gastrointestinal: Negative for abdominal pain.  Neurological: Negative for headaches.  All other systems reviewed and are negative.     Allergies  Albuterol  Home Medications   Prior to Admission medications   Medication Sig Start Date End Date Taking? Authorizing Provider  acetaminophen (TYLENOL) 500 MG tablet Take 500 mg by mouth every 6 (six) hours as needed for mild pain.    Historical Provider, MD  bisoprolol (ZEBETA) 5 MG tablet Take 5 mg by mouth every morning.    Historical Provider, MD  Calcium Carb-Cholecalciferol (CALCIUM 600 + D) 600-200 MG-UNIT TABS Take 1 tablet by mouth 2 (two) times daily.    Historical Provider, MD  diltiazem (CARDIZEM CD) 240 MG 24 hr capsule TAKE ONE CAPSULE BY MOUTH EVERY EVENING 10/04/14   Larey Dresser, MD  diltiazem (DILACOR XR) 240 MG 24 hr capsule TAKE ONE CAPSULE BY MOUTH EVERY EVENING 04/11/14   Larey Dresser, MD  furosemide (LASIX) 40 MG tablet TAKE 1 TABLET EVERY MORNING ,AND TAKE 1/2 TABLET EVERY EVENING 04/11/14   Larey Dresser, MD  furosemide (LASIX) 40 MG tablet TAKE 1 TABLET EVERY MORNING ,AND TAKE 1/2 TABLET EVERY EVENING 09/16/14   Larey Dresser, MD  iron polysaccharides (FERREX 150) 150 MG capsule Take 150 mg by mouth 2 (two)  times daily.    Historical Provider, MD  levothyroxine (SYNTHROID, LEVOTHROID) 50 MCG tablet Take 50 mcg by mouth daily before breakfast.    Historical Provider, MD  LORazepam (ATIVAN) 0.5 MG tablet Take 1 tablet by mouth at bedtime as needed (for sleep).     Historical Provider, MD  nitroGLYCERIN (NITROSTAT) 0.4 MG SL tablet Place 0.4 mg under the tongue every 5 (five) minutes as needed for chest pain.     Historical Provider, MD  potassium chloride  SA (KLOR-CON M20) 20 MEQ tablet Take 1 tablet (20 mEq total) by mouth 2 (two) times daily. 10/04/14   Larey Dresser, MD  simvastatin (ZOCOR) 40 MG tablet Take 1 tablet (40 mg total) by mouth every evening. 10/04/14   Larey Dresser, MD  Tiotropium Bromide Monohydrate (SPIRIVA RESPIMAT) 2.5 MCG/ACT AERS Inhale 2 puffs into the lungs daily. 02/03/14   Brand Males, MD   BP 133/59 mmHg  Pulse 71  Resp 14  SpO2 95% Physical Exam  Constitutional: She is oriented to person, place, and time. She appears well-developed and well-nourished. No distress.  HENT:  Head: Normocephalic and atraumatic.  Mouth/Throat: Oropharynx is clear and moist.  Eyes: Conjunctivae and EOM are normal. Pupils are equal, round, and reactive to light.  Neck: Normal range of motion. Neck supple.  Cardiovascular: Normal rate, regular rhythm and intact distal pulses.   No murmur heard. Pulmonary/Chest: Effort normal and breath sounds normal. No tachypnea. No respiratory distress. She has no wheezes. She has no rales.   She exhibits tenderness.    Abdominal: Soft. She exhibits no distension. There is tenderness in the right upper quadrant. There is no rebound, no guarding and no CVA tenderness.  Musculoskeletal: Normal range of motion. She exhibits no edema or tenderness.  Neurological: She is alert and oriented to person, place, and time.  Skin: Skin is warm and dry. No rash noted. No erythema.  Psychiatric: She has a normal mood and affect. Her behavior is normal.  Nursing  note and vitals reviewed.   ED Course  Procedures (including critical care time) Labs Review Labs Reviewed  CBC WITH DIFFERENTIAL/PLATELET - Abnormal; Notable for the following:    RBC 3.40 (*)    Hemoglobin 9.9 (*)    HCT 30.5 (*)    Monocytes Relative 14 (*)    All other components within normal limits  BASIC METABOLIC PANEL - Abnormal; Notable for the following:    Glucose, Bld 114 (*)    BUN 28 (*)    Creatinine, Ser 1.71 (*)    GFR calc non Af Amer 26 (*)    GFR calc Af Amer 30 (*)    All other components within normal limits    Imaging Review Dg Ribs Unilateral W/chest Right  12/22/2014   CLINICAL DATA:  Right lower anterior rib pain. Pain. Status post fall.  EXAM: RIGHT RIBS AND CHEST - 3+ VIEW  COMPARISON:  None.  FINDINGS: No fracture or other bone lesions are seen involving the ribs. There is no evidence of pneumothorax or pleural effusion. Mild right apical scarring. Mild biapical scarring. Heart size and mediastinal contours are within normal limits.  IMPRESSION: Negative.   Electronically Signed   By: Kathreen Devoid   On: 12/22/2014 11:50   Ct Head Wo Contrast  12/22/2014   CLINICAL DATA:  Patient status post fall. Denies hitting head. No reported loss of consciousness.  EXAM: CT HEAD WITHOUT CONTRAST  TECHNIQUE: Contiguous axial images were obtained from the base of the skull through the vertex without intravenous contrast.  COMPARISON:  MR brain 09/30/2009 ; CT brain 09/30/2009  FINDINGS: Ventricles and sulci are prominent compatible with atrophy. Periventricular and subcortical white matter hypodensity compatible with chronic small vessel ischemic changes. More focal hypodensity within the right occipital lobe may represent sequelae of chronic infarct. No evidence for acute cortically based infarct, intracranial hemorrhage, mass lesion or mass-effect. Orbits are unremarkable. Paranasal sinuses are well aerated. Mastoid air cells unremarkable. Calvarium intact.  IMPRESSION:  No  acute intracranial process.  Cortical atrophy.  Chronic small vessel ischemic changes.   Electronically Signed   By: Lovey Newcomer M.D.   On: 12/22/2014 12:08   I have personally reviewed and evaluated these images and lab results as part of my medical decision-making.  EMERGENCY DEPARTMENT [place] FAST EXAM  INDICATIONS: fall with pain to the right ribs and RUQ PERFORMED BY: Myself  IMAGES ARCHIVED?: Yes  FINDINGS: All views negative  LIMITATIONS:  none  INTERPRETATION:  No abdominal free fluid  COMMENT:  Normal 3 view fast exam   MDM   Final diagnoses:  Rib pain on right side    Patient presents today after 2 falls at approximately 1 AM this morning. She stumbled after getting up from reading and fell landing on her left side and then when she got up after the fall she stumbled again and fell a second time. She denies tripping over anything. She denies any symptoms concerning for a TIA such as unilateral weakness, numbness, dizziness. No concerns for syncope as patient denies feeling lightheaded, palpitations, chest pain or new shortness of breath. Family member is present in the room and states that she was able to ambulate today without difficulty but in the last year or 2 she will get up too quickly and occasionally stumble. Patient takes Eliquis and is complaining of right-sided rib pain with some upper quadrant abdominal tenderness. She is tender in the right anterior and posterior ribs with clear breath sounds bilaterally. No evidence of abdominal trauma and no extremity pain. No neck pain, headache or LOC. No head injury.  Patient given pain control and rib films and chest x-ray pending.  Bedside FAST exam was negative.  11:11 AM For family arrives and states that they're concerned that she may have hit her head and being on blood thinner they want to have that checked as well as she's had issues with rectal bleeding in the past and had extensive surgery done and she has not had her  blood counts checked in a while.  CBC, BMP and head CT also pending.  12:00 PM CBC with stable hb at 9.9 and 10.2 almost a year ago.  Creatinine at baseline.  No hx of rectal bleeding at this time.  Significant improvement after vicodin.  Imaging neg for acute pathology.  Will treat for occult rib fractures and d/c home.  Pt counseled bout using stool softeners to prevent constipation.  Blanchie Dessert, MD 12/22/14 1206  Blanchie Dessert, MD 12/22/14 1213

## 2014-12-27 ENCOUNTER — Encounter (HOSPITAL_COMMUNITY): Payer: Medicare Other

## 2014-12-27 ENCOUNTER — Ambulatory Visit: Payer: Medicare Other | Admitting: Family

## 2015-01-18 ENCOUNTER — Encounter: Payer: Self-pay | Admitting: Family

## 2015-01-20 ENCOUNTER — Ambulatory Visit (HOSPITAL_COMMUNITY)
Admission: RE | Admit: 2015-01-20 | Discharge: 2015-01-20 | Disposition: A | Discharge status: Home / Self Care | Payer: Medicare Other | Source: Ambulatory Visit | Attending: Family | Admitting: Family

## 2015-01-20 ENCOUNTER — Other Ambulatory Visit: Payer: Self-pay | Admitting: Family

## 2015-01-20 ENCOUNTER — Encounter: Payer: Self-pay | Admitting: Family

## 2015-01-20 ENCOUNTER — Ambulatory Visit (INDEPENDENT_AMBULATORY_CARE_PROVIDER_SITE_OTHER): Payer: Medicare Other | Admitting: Family

## 2015-01-20 VITALS — BP 124/53 | HR 62 | Temp 97.2°F | Resp 14 | Ht 62.0 in | Wt 122.0 lb

## 2015-01-20 DIAGNOSIS — Z9889 Other specified postprocedural states: Secondary | ICD-10-CM

## 2015-01-20 DIAGNOSIS — I6523 Occlusion and stenosis of bilateral carotid arteries: Secondary | ICD-10-CM

## 2015-01-20 DIAGNOSIS — Z48812 Encounter for surgical aftercare following surgery on the circulatory system: Secondary | ICD-10-CM | POA: Diagnosis not present

## 2015-01-20 DIAGNOSIS — I10 Essential (primary) hypertension: Secondary | ICD-10-CM | POA: Diagnosis not present

## 2015-01-20 DIAGNOSIS — I6522 Occlusion and stenosis of left carotid artery: Secondary | ICD-10-CM | POA: Insufficient documentation

## 2015-01-20 DIAGNOSIS — E785 Hyperlipidemia, unspecified: Secondary | ICD-10-CM | POA: Insufficient documentation

## 2015-01-20 NOTE — Patient Instructions (Signed)
Stroke Prevention Some medical conditions and behaviors are associated with an increased chance of having a stroke. You may prevent a stroke by making healthy choices and managing medical conditions. HOW CAN I REDUCE MY RISK OF HAVING A STROKE?   Stay physically active. Get at least 30 minutes of activity on most or all days.  Do not smoke. It may also be helpful to avoid exposure to secondhand smoke.  Limit alcohol use. Moderate alcohol use is considered to be:  No more than 2 drinks per day for men.  No more than 1 drink per day for nonpregnant women.  Eat healthy foods. This involves:  Eating 5 or more servings of fruits and vegetables a day.  Making dietary changes that address high blood pressure (hypertension), high cholesterol, diabetes, or obesity.  Manage your cholesterol levels.  Making food choices that are high in fiber and low in saturated fat, trans fat, and cholesterol may control cholesterol levels.  Take any prescribed medicines to control cholesterol as directed by your health care provider.  Manage your diabetes.  Controlling your carbohydrate and sugar intake is recommended to manage diabetes.  Take any prescribed medicines to control diabetes as directed by your health care provider.  Control your hypertension.  Making food choices that are low in salt (sodium), saturated fat, trans fat, and cholesterol is recommended to manage hypertension.  Take any prescribed medicines to control hypertension as directed by your health care provider.  Maintain a healthy weight.  Reducing calorie intake and making food choices that are low in sodium, saturated fat, trans fat, and cholesterol are recommended to manage weight.  Stop drug abuse.  Avoid taking birth control pills.  Talk to your health care provider about the risks of taking birth control pills if you are over 35 years old, smoke, get migraines, or have ever had a blood clot.  Get evaluated for sleep  disorders (sleep apnea).  Talk to your health care provider about getting a sleep evaluation if you snore a lot or have excessive sleepiness.  Take medicines only as directed by your health care provider.  For some people, aspirin or blood thinners (anticoagulants) are helpful in reducing the risk of forming abnormal blood clots that can lead to stroke. If you have the irregular heart rhythm of atrial fibrillation, you should be on a blood thinner unless there is a good reason you cannot take them.  Understand all your medicine instructions.  Make sure that other conditions (such as anemia or atherosclerosis) are addressed. SEEK IMMEDIATE MEDICAL CARE IF:   You have sudden weakness or numbness of the face, arm, or leg, especially on one side of the body.  Your face or eyelid droops to one side.  You have sudden confusion.  You have trouble speaking (aphasia) or understanding.  You have sudden trouble seeing in one or both eyes.  You have sudden trouble walking.  You have dizziness.  You have a loss of balance or coordination.  You have a sudden, severe headache with no known cause.  You have new chest pain or an irregular heartbeat. Any of these symptoms may represent a serious problem that is an emergency. Do not wait to see if the symptoms will go away. Get medical help at once. Call your local emergency services (911 in [place] Do not drive yourself to the hospital. Document Released: 05/23/2004 Document Revised: 08/30/2013 Document Reviewed: 10/16/2012 [employer] Patient Information 2015 [employer]. This information is not intended to replace advice given   to you by your health care provider. Make sure you discuss any questions you have with your health care provider.  

## 2015-01-20 NOTE — Progress Notes (Signed)
Established Carotid Patient   History of Present Illness  [name] is a 81 y.o. female patient of Dr. Kellie Simmering who is status post right carotid endarterectomy in 2011. She returns today for follow up. She had a TIA before 2001 as manifested by expressive aphasia, denies hemiparesis, denies monocular loss of vision; no TIA or stroke symptoms since then. She denies tingling, numbness, pain, or cold sensation in either hand or arm.  Pt denies any claudication symptoms with walking. Pt reports New Medical or Surgical History: fell about July or August 2016 and fractured a few ribs, is improving from this.  Pt Diabetic: No Pt smoker: non-smoker, but she had lifelong exposure to second hand smoke and has COPD  Pt meds include: Statin : Yes ASA: No: she had rectal bleeding on coumadin which was stopped, then Eliquis tried and she again had rectal bleeding, per daughter. Rectal bleeding is thought to be facilitated by radiation treatment for uterine cancer after her hysterectomy. Other anticoagulants/antiplatelets: Eliquis   Past Medical History  Diagnosis Date  . TIA (transient ischemic attack) yrs ago  . Unspecified hypothyroidism   . Unspecified essential hypertension   . Allergic rhinitis, cause unspecified   . Hiatal hernia   . SOB (shortness of breath)     unexplained SOB onset fall 2010. PFT normal 06-12-09. Right heatrt Cath 06-26-09 nl PA but wedge 23 with non obst CAD, echo same day: Left ventricle: the cavity size was normal. Wall thickness was increased in a pattern of mild LVH. Systolic function was vigorous. Estimated ejection fraction 65% to 70%. Left atrium mildly dilated. nml walking sats 11-17-2009  . Hyperlipidemia   . Arrhythmia atrial fib  . Carotid artery occlusion     carotid endarterectomy     . Atrial fibrillation     paroxymal  . Carcinoma of uterus Feb. 2012    High-grade serous stage Ia/vaginal cuff recurrence July 2012  . Osteoporosis   . COPD  (chronic obstructive pulmonary disease)   . History of blood transfusion 08-26-13    1 unit given 08-26-13, 3 units given jan 2015  . Rectal bleeding 05-12-13    in hospital 4 days  . Diverticulitis   . Coronary artery disease   . History of home oxygen therapy     uses oxygen 2 liters per nasal cannula at hs  . Anemia   . Stroke     Mini    Social History Social History  Substance Use Topics  . Smoking status: Passive Smoke Exposure - Never Smoker  . Smokeless tobacco: Never Used  . Alcohol Use: No    Family History Family History  Problem Relation Age of Onset  . Heart disease Mother   . Hypertension Mother   . Stroke Mother   . Heart disease Sister 60    Heart Disease before age 17  . Lung cancer Sister   . COPD Sister   . Hypertension Sister   . Heart disease Brother     Before age 79  . Cancer Brother   . Hypertension Brother   . Heart attack Brother   . Coronary artery disease Other   . Breast cancer Daughter 28  . Heart attack Daughter   . Heart disease Daughter     Before age 45  . Prostate cancer Son   . Heart attack Son     Surgical History Past Surgical History  Procedure Laterality Date  . Tubal ligation  1970  . Carotid endarterectomy  08/04/2009    Right  CEA  . Internal radiation  June 13,2013 and  October 24, 2011    5 dose by Dr. Belva Bertin  . External radiation  May 7,2013- October 07, 2011    25 treatments   . Carotid endarterectomy    . Abdominal hysterectomy  April 20,2012    by Dr. Rhodia Albright at  [facility]  . Cholecystectomy  yrs ago  . Flexible sigmoidoscopy N/A 09/02/2013    Procedure: FLEXIBLE SIGMOIDOSCOPY;  Surgeon: Jerene Bears, MD;  Location: [facility];  Service: Gastroenterology;  Laterality: N/A;    Allergies  Allergen Reactions  . Albuterol Swelling and Other (See Comments)    Pt states she was shaking  devloped tremors, swelling during PFT following albuterol    Current Outpatient Prescriptions  Medication Sig Dispense  Refill  . acetaminophen (TYLENOL) 500 MG tablet Take 500 mg by mouth every 6 (six) hours as needed for mild pain.    Marland Kitchen apixaban (ELIQUIS) 2.5 MG TABS tablet Take 2.5 mg by mouth 2 (two) times daily.    . bisoprolol (ZEBETA) 5 MG tablet Take 5 mg by mouth every morning.    . Calcium Carb-Cholecalciferol (CALCIUM 600 + D) 600-200 MG-UNIT TABS Take 1 tablet by mouth 2 (two) times daily.    Marland Kitchen diltiazem (CARDIZEM CD) 240 MG 24 hr capsule TAKE ONE CAPSULE BY MOUTH EVERY EVENING 30 capsule 5  . diltiazem (DILACOR XR) 240 MG 24 hr capsule TAKE ONE CAPSULE BY MOUTH EVERY EVENING 30 capsule 1  . docusate sodium (COLACE) 100 MG capsule Take 1 capsule (100 mg total) by mouth every 12 (twelve) hours. Continue while taking pain medication 60 capsule 0  . furosemide (LASIX) 40 MG tablet TAKE 1 TABLET EVERY MORNING ,AND TAKE 1/2 TABLET EVERY EVENING 45 tablet 3  . furosemide (LASIX) 40 MG tablet TAKE 1 TABLET EVERY MORNING ,AND TAKE 1/2 TABLET EVERY EVENING 45 tablet 5  . HYDROcodone-acetaminophen (NORCO/VICODIN) 5-325 MG per tablet Take 1 tablet by mouth every 6 (six) hours as needed. 15 tablet 0  . iron polysaccharides (FERREX 150) 150 MG capsule Take 150 mg by mouth 2 (two) times daily.    Marland Kitchen levothyroxine (SYNTHROID, LEVOTHROID) 50 MCG tablet Take 50 mcg by mouth daily before breakfast.    . LORazepam (ATIVAN) 0.5 MG tablet Take 1 tablet by mouth at bedtime as needed (for sleep).     . nitroGLYCERIN (NITROSTAT) 0.4 MG SL tablet Place 0.4 mg under the tongue every 5 (five) minutes as needed for chest pain.     Marland Kitchen oxybutynin (DITROPAN) 5 MG tablet Take 5 mg by mouth 2 (two) times daily.    . potassium chloride SA (KLOR-CON M20) 20 MEQ tablet Take 1 tablet (20 mEq total) by mouth 2 (two) times daily. 60 tablet 5  . simvastatin (ZOCOR) 40 MG tablet Take 1 tablet (40 mg total) by mouth every evening. 30 tablet 5  . Tiotropium Bromide Monohydrate (SPIRIVA RESPIMAT) 2.5 MCG/ACT AERS Inhale 2 puffs into the lungs daily.  4 g 6  . [DISCONTINUED] diltiazem (TIAZAC) 240 MG 24 hr capsule TAKE ONE CAPSULE BY MOUTH EVERY EVENING 30 capsule 1   No current facility-administered medications for this visit.    Review of Systems : See HPI for pertinent positives and negatives.  Physical Examination  Filed Vitals:   01/20/15 1551 01/20/15 1552  BP: 105/67 124/53  Pulse: 63 62  Temp: 97.2 F (36.2 C)   Resp: 14   Height:  5\' 2"  (1.575 m)   Weight: 122 lb (55.339 kg)   SpO2: 98%    Body mass index is 22.31 kg/(m^2).  General: WDWN female in NAD GAIT: normal Eyes: PERRLA Pulmonary: Non-labored, CTAB but diminished air movement in all fields, no rales, no rhonchi, & no wheezing.  Cardiac: regular rhythm, no detected murmur.  VASCULAR EXAM Carotid Bruits Right Left   Negative Negative   Aorta is not palpable. Radial pulses are 2+ palpable and equal.   Gastrointestinal: soft, nontender, BS WNL, no r/g,no palpable masses.  Musculoskeletal: No muscle atrophy/wasting. M/S 5/5 throughout, Extremities without ischemic changes.  Neurologic: A&O X 3; Appropriate Affect, Speech is normal CN 2-12 intact, Pain and light touch intact in extremities, Motor exam as listed above.         Non-Invasive Vascular Imaging CAROTID DUPLEX 01/20/2015   CEREBROVASCULAR DUPLEX EVALUATION    INDICATION: Carotid artery stenosis    PREVIOUS INTERVENTION(S): Right carotid endarterectomy 08/04/2009    DUPLEX EXAM:     RIGHT  LEFT  Peak Systolic Velocities (cm/s) End Diastolic Velocities (cm/s) Plaque LOCATION Peak Systolic Velocities (cm/s) End Diastolic Velocities (cm/s) Plaque  77 16  CCA PROXIMAL 77 11   84 20  CCA MID 58 15   50 12  CCA DISTAL 72 14   80 0  ECA 100 8   58 15  ICA PROXIMAL 106 30   87 25  ICA MID 79 19   146 34  ICA DISTAL 73 17     Carotid endarterectomy ICA / CCA Ratio (PSV) 1.83  Antegrade Vertebral Flow Antegrade  NA Brachial Systolic Pressure (mmHg) NA   NA Brachial Artery Waveforms NA    Plaque Morphology:  HM = Homogeneous, HT = Heterogeneous, CP = Calcific Plaque, SP = Smooth Plaque, IP = Irregular Plaque     ADDITIONAL FINDINGS: Right subclavian artery PSV178cm/sec; Left Subclavian artery PSV89cm/sec    IMPRESSION: Right internal carotid artery is patent with history of carotid endarterectomy, no hyperplasia or hemodynamically significant stenosis present. Left internal carotid artery stenosis present of less than 40%.    Compared to the previous exam:  Essentially unchanged since previous study on 12/21/2013.      Assessment: [name] is a 81 y.o. female who is status post right carotid endarterectomy in 2011. She had a TIA in 2001, no TIA or stroke subsequently.  Today's carotid duplex suggests a patent right internal carotid artery with history of carotid endarterectomy, no hyperplasia or hemodynamically significant stenosis present. Left internal carotid artery stenosis present of less than 40%. Essentially unchanged since previous study on 12/21/2013.    Plan: Follow-up in 1 year with Carotid Duplex.   I discussed in depth with the patient the nature of atherosclerosis, and emphasized the importance of maximal medical management including strict control of blood pressure, blood glucose, and lipid levels, obtaining regular exercise, and continued cessation of smoking.  The patient is aware that without maximal medical management the underlying atherosclerotic disease process will progress, limiting the benefit of any interventions. The patient was given information about stroke prevention and what symptoms should prompt the patient to seek immediate medical care. Thank you for allowing [place] to participate in this patient's care.  Clemon Chambers, RN, MSN, FNP-C [facility] Office: [phone]  Clinic Physician: Bridgett Larsson on call  01/20/2015 4:20 PM

## 2015-01-23 NOTE — Addendum Note (Signed)
Addended by: Dorthula Rue L on: 01/23/2015 09:28 AM   Modules accepted: Orders

## 2015-01-24 ENCOUNTER — Encounter: Payer: Self-pay | Admitting: Internal Medicine

## 2015-01-24 ENCOUNTER — Ambulatory Visit (INDEPENDENT_AMBULATORY_CARE_PROVIDER_SITE_OTHER): Payer: Medicare Other | Admitting: Internal Medicine

## 2015-01-24 VITALS — BP 118/70 | HR 67 | Ht 61.0 in | Wt 122.0 lb

## 2015-01-24 DIAGNOSIS — Z23 Encounter for immunization: Secondary | ICD-10-CM | POA: Diagnosis not present

## 2015-01-24 DIAGNOSIS — R0602 Shortness of breath: Secondary | ICD-10-CM

## 2015-01-24 NOTE — Progress Notes (Signed)
Subjective:    Patient ID: [name], female    DOB: Sep 18, 1923, 81 y.o.   MRN: [id number]  HPI    OPV 01/24/2015   Chief Complaint  Patient presents with  . Follow-up    Pt states her breathing is unchanged since last OV in 12/2013. Pt states she thinks the spiriva respimat is helping. Pt c/o dry cough. Pt denies chest congestion and CP/tightness.     Follow-up dyspnea due to emphysema, diastolic dysfunction and deconditioning. She presents with her daughter Vivien Rota. This is annual follow-up. In the interim they tell me that the gynecologic cancer is in remission. She only had a evaluation within the last month or 2. Also one month ago she tripped and fell in the night and she believes she sustained fractures although chest x-ray report for review and of August 2016 shows normal fractures. I updated her about this information. Dyspnea stable. Spiriva is helping. Dyspnea is moderate. They by rest. She still not interested fully in pulmonary rehabilitation. There are no other new issues.  CT chest 9/.25/15 IMPRESSION: 1. No interval change in the 6 mm right lower lobe pulmonary nodule. This nodule is not substantially changed since a scan from 08/09/2010 and most likely reflects a benign process. 2. Emphysema with bilateral changes of bronchiectasis. 3. 15 mm right adrenal nodule stable since 05/17/2010, most likely representing adenoma   Electronically Signed  By: Misty Stanley M.D.  On: 01/21/2014 11:55    Immunization History  Administered Date(s) Administered  . Influenza Split 02/28/2012  . Influenza Whole 01/27/2009, 02/26/2011  . Influenza, High Dose Seasonal PF 12/24/2012  . Influenza,inj,Quad PF,36+ Mos 01/17/2014  . Pneumococcal Polysaccharide-23 01/27/2005      Current outpatient prescriptions:  .  acetaminophen (TYLENOL) 500 MG tablet, Take 500 mg by mouth every 6 (six) hours as needed for mild pain., Disp: , Rfl:  .  apixaban (ELIQUIS) 2.5 MG TABS  tablet, Take 2.5 mg by mouth 2 (two) times daily., Disp: , Rfl:  .  bisoprolol (ZEBETA) 5 MG tablet, Take 5 mg by mouth every morning., Disp: , Rfl:  .  Calcium Carb-Cholecalciferol (CALCIUM 600 + D) 600-200 MG-UNIT TABS, Take 1 tablet by mouth 2 (two) times daily., Disp: , Rfl:  .  diltiazem (DILACOR XR) 240 MG 24 hr capsule, TAKE ONE CAPSULE BY MOUTH EVERY EVENING, Disp: 30 capsule, Rfl: 1 .  docusate sodium (COLACE) 100 MG capsule, Take 1 capsule (100 mg total) by mouth every 12 (twelve) hours. Continue while taking pain medication, Disp: 60 capsule, Rfl: 0 .  furosemide (LASIX) 40 MG tablet, TAKE 1 TABLET EVERY MORNING ,AND TAKE 1/2 TABLET EVERY EVENING, Disp: 45 tablet, Rfl: 5 .  HYDROcodone-acetaminophen (NORCO/VICODIN) 5-325 MG per tablet, Take 1 tablet by mouth every 6 (six) hours as needed., Disp: 15 tablet, Rfl: 0 .  iron polysaccharides (FERREX 150) 150 MG capsule, Take 150 mg by mouth 2 (two) times daily., Disp: , Rfl:  .  levothyroxine (SYNTHROID, LEVOTHROID) 50 MCG tablet, Take 50 mcg by mouth daily before breakfast., Disp: , Rfl:  .  LORazepam (ATIVAN) 0.5 MG tablet, Take 1 tablet by mouth at bedtime as needed (for sleep). , Disp: , Rfl:  .  nitroGLYCERIN (NITROSTAT) 0.4 MG SL tablet, Place 0.4 mg under the tongue every 5 (five) minutes as needed for chest pain. , Disp: , Rfl:  .  oxybutynin (DITROPAN) 5 MG tablet, Take 5 mg by mouth 2 (two) times daily., Disp: , Rfl:  .  potassium chloride SA (KLOR-CON M20) 20 MEQ tablet, Take 1 tablet (20 mEq total) by mouth 2 (two) times daily., Disp: 60 tablet, Rfl: 5 .  simvastatin (ZOCOR) 40 MG tablet, Take 1 tablet (40 mg total) by mouth every evening., Disp: 30 tablet, Rfl: 5 .  Tiotropium Bromide Monohydrate (SPIRIVA RESPIMAT) 2.5 MCG/ACT AERS, Inhale 2 puffs into the lungs daily., Disp: 4 g, Rfl: 6 .  [DISCONTINUED] diltiazem (TIAZAC) 240 MG 24 hr capsule, TAKE ONE CAPSULE BY MOUTH EVERY EVENING, Disp: 30 capsule, Rfl: 1  Allergies    Allergen Reactions  . Albuterol Swelling and Other (See Comments)    Pt states she was shaking  devloped tremors, swelling during PFT following albuterol     Review of Systems By history of present illness    Objective:   Physical Exam  Constitutional: She is oriented to person, place, and time. She appears well-developed and well-nourished. No distress.  HENT:  Head: Normocephalic and atraumatic.  Right Ear: External ear normal.  Left Ear: External ear normal.  Mouth/Throat: Oropharynx is clear and moist. No oropharyngeal exudate.  Eyes: Conjunctivae and EOM are normal. Pupils are equal, round, and reactive to light. Right eye exhibits no discharge. Left eye exhibits no discharge. No scleral icterus.  Neck: Normal range of motion. Neck supple. No JVD present. No tracheal deviation present. No thyromegaly present.  Cardiovascular: Normal rate, regular rhythm, normal heart sounds and intact distal pulses.  Exam reveals no gallop and no friction rub.   No murmur heard. Pulmonary/Chest: Effort normal and breath sounds normal. No respiratory distress. She has no wheezes. She has no rales. She exhibits no tenderness.  Abdominal: Soft. Bowel sounds are normal. She exhibits no distension and no mass. There is no tenderness. There is no rebound and no guarding.  Musculoskeletal: Normal range of motion. She exhibits no edema or tenderness.  Lymphadenopathy:    She has no cervical adenopathy.  Neurological: She is alert and oriented to person, place, and time. She has normal reflexes. No cranial nerve deficit. She exhibits normal muscle tone. Coordination normal.  Skin: Skin is warm and dry. No rash noted. She is not diaphoretic. No erythema. No pallor.  Psychiatric: She has a normal mood and affect. Her behavior is normal. Judgment and thought content normal.  Vitals reviewed.    Filed Vitals:   01/24/15 1202  BP: 118/70  Pulse: 67  Height: 5\' 1"  (1.549 m)  Weight: 122 lb (55.339 kg)   SpO2: 98%         Assessment & Plan:     ICD-9-CM ICD-10-CM   1. DYSPNEA 786.05 R06.02    ##Shortness of breath/Fatigue/Emphysema - multifactoiria : poissibly heart muscle relaxation issue, deconditioning, emphysema  - Continue oxygen at night  spiriva to respimat Remain active  - HAve flu shot 01/24/2015 - #FOllowup - as needed; no active fu = regular fu with PCP ARONSON,RICHARD A, MD   Dr. Brand Males, M.D., [facility].C.P Pulmonary and Critical Care Medicine Staff Physician [facility] Pager: [phone], If no answer or between  15:00h - 7:00h: call [phone]  01/24/2015 1:57 PM

## 2015-01-24 NOTE — Patient Instructions (Signed)
##  Shortness of breath/Fatigue/Emphysema - multifactoiria : poissibly heart muscle relaxation issue, deconditioning, emphysema  - Continue oxygen at night  spiriva to respimat Remain active  - HAve flu shot 01/24/2015 - #FOllowup - as needed; no active fu = regular fu with PCP Geoffery Lyons, MD

## 2015-01-26 DIAGNOSIS — Z23 Encounter for immunization: Secondary | ICD-10-CM | POA: Diagnosis not present

## 2015-02-19 ENCOUNTER — Other Ambulatory Visit: Payer: Self-pay | Admitting: Internal Medicine

## 2015-02-26 ENCOUNTER — Other Ambulatory Visit: Payer: Self-pay | Admitting: Cardiology

## 2015-03-16 ENCOUNTER — Other Ambulatory Visit: Payer: Self-pay | Admitting: Cardiology

## 2015-03-20 ENCOUNTER — Ambulatory Visit (INDEPENDENT_AMBULATORY_CARE_PROVIDER_SITE_OTHER): Payer: Medicare Other | Admitting: Physician Assistant

## 2015-03-20 ENCOUNTER — Encounter: Payer: Self-pay | Admitting: Physician Assistant

## 2015-03-20 VITALS — BP 118/54 | HR 64 | Ht 62.0 in | Wt 122.4 lb

## 2015-03-20 DIAGNOSIS — I480 Paroxysmal atrial fibrillation: Secondary | ICD-10-CM | POA: Diagnosis not present

## 2015-03-20 DIAGNOSIS — I10 Essential (primary) hypertension: Secondary | ICD-10-CM | POA: Diagnosis not present

## 2015-03-20 DIAGNOSIS — I5032 Chronic diastolic (congestive) heart failure: Secondary | ICD-10-CM

## 2015-03-20 NOTE — Progress Notes (Signed)
Cardiology Office Note   Date:  03/20/2015   ID:  [name], DOB 10/16/1923, MRN [id number]  PCP:  Geoffery Lyons, MD  Cardiologist: Dr. Aundra Dubin  Chief Complaint: Dyspnea on exertion    History of Present Illness: [name] is a 81 y.o. female who presents for six-month follow-up.She has COPD on home oxygen at night, diastolic heart failure, nonobstructive CAD on cath in 2011, Myoview in 2012 showed no ischemia or infarction. or exertional atrial fibrillation on diltiazem. She was previously on Coumadin but it was switched to Eliquis after lower GI bleed. She had radiation proctitis and underwent APC ablation. She also has hypertension hyperlipidemia.  Patient comes in today accompanied by her daughter. Her main complaint is just getting old and feeling off balance. She is walking more and going around the block 3 or 4 days a week. She denies any chest pain, palpitations, dizziness, or presyncope. She has chronic dyspnea on exertion but it has been unchanged.    Past Medical History  Diagnosis Date  . TIA (transient ischemic attack) yrs ago  . Unspecified hypothyroidism   . Unspecified essential hypertension   . Allergic rhinitis, cause unspecified   . Hiatal hernia   . SOB (shortness of breath)     unexplained SOB onset fall 2010. PFT normal 06-12-09. Right heatrt Cath 06-26-09 nl PA but wedge 23 with non obst CAD, echo same day: Left ventricle: the cavity size was normal. Wall thickness was increased in a pattern of mild LVH. Systolic function was vigorous. Estimated ejection fraction 65% to 70%. Left atrium mildly dilated. nml walking sats 11-17-2009  . Hyperlipidemia   . Arrhythmia atrial fib  . Carotid artery occlusion     carotid endarterectomy     . Atrial fibrillation ([place])     paroxymal  . Carcinoma of uterus [facility]) Feb. 2012    High-grade serous stage Ia/vaginal cuff recurrence July 2012  . Osteoporosis   . COPD (chronic obstructive pulmonary disease) ([place])    . History of blood transfusion 08-26-13    1 unit given 08-26-13, 3 units given jan 2015  . Rectal bleeding 05-12-13    in hospital 4 days  . Diverticulitis   . Coronary artery disease   . History of home oxygen therapy     uses oxygen 2 liters per nasal cannula at hs  . Anemia   . Stroke [facility])     Mini    Past Surgical History  Procedure Laterality Date  . Tubal ligation  1970  . Carotid endarterectomy  08/04/2009    Right  CEA  . Internal radiation  June 13,2013 and  October 24, 2011    5 dose by Dr. Belva Bertin  . External radiation  May 7,2013- October 07, 2011    25 treatments   . Carotid endarterectomy    . Abdominal hysterectomy  April 20,2012    by Dr. Rhodia Albright at  [facility]  . Cholecystectomy  yrs ago  . Flexible sigmoidoscopy N/A 09/02/2013    Procedure: FLEXIBLE SIGMOIDOSCOPY;  Surgeon: Jerene Bears, MD;  Location: [facility];  Service: Gastroenterology;  Laterality: N/A;     Current Outpatient Prescriptions  Medication Sig Dispense Refill  . acetaminophen (TYLENOL) 500 MG tablet Take 500 mg by mouth every 6 (six) hours as needed for mild pain.    Marland Kitchen apixaban (ELIQUIS) 2.5 MG TABS tablet Take 2.5 mg by mouth 2 (two) times daily.    . bisoprolol (ZEBETA) 5 MG tablet Take  5 mg by mouth every morning.    . Calcium Carb-Cholecalciferol (CALCIUM 600 + D) 600-200 MG-UNIT TABS Take 1 tablet by mouth 2 (two) times daily.    Marland Kitchen diltiazem (DILACOR XR) 240 MG 24 hr capsule TAKE ONE CAPSULE BY MOUTH EVERY EVENING 30 capsule 1  . furosemide (LASIX) 40 MG tablet TAKE 1 TABLET BY MOUTH EVERY MORNING AND TAKE 1/2 TABLET BY MOUTH EVERY EVENING 45 tablet 5  . iron polysaccharides (FERREX 150) 150 MG capsule Take 150 mg by mouth 2 (two) times daily.    Marland Kitchen KLOR-CON M20 20 MEQ tablet TAKE 1 TABLET BY MOUTH TWICE A DAY 60 tablet 5  . levothyroxine (SYNTHROID, LEVOTHROID) 50 MCG tablet Take 50 mcg by mouth daily before breakfast.    . LORazepam (ATIVAN) 0.5 MG tablet Take 1 tablet by mouth at  bedtime as needed (for sleep).     . nitroGLYCERIN (NITROSTAT) 0.4 MG SL tablet Place 0.4 mg under the tongue every 5 (five) minutes as needed for chest pain.     Marland Kitchen oxybutynin (DITROPAN) 5 MG tablet Take 5 mg by mouth 2 (two) times daily.    . simvastatin (ZOCOR) 40 MG tablet Take 1 tablet (40 mg total) by mouth every evening. 30 tablet 5  . SPIRIVA RESPIMAT 2.5 MCG/ACT AERS INHALE 2 PUFFS INTO THE LUNGS DAILY. 4 g 5  . [DISCONTINUED] diltiazem (TIAZAC) 240 MG 24 hr capsule TAKE ONE CAPSULE BY MOUTH EVERY EVENING 30 capsule 1   No current facility-administered medications for this visit.    Allergies:   Albuterol    Social History:  The patient  reports that she has been passively smoking.  She has never used smokeless tobacco. She reports that she does not drink alcohol or use illicit drugs.   Family History:  The patient's    family history includes Breast cancer (age of onset: 74) in her daughter; COPD in her sister; Cancer in her brother; Coronary artery disease in her other; Heart attack in her brother, daughter, and son; Heart disease in her brother, daughter, and mother; Heart disease (age of onset: 97) in her sister; Hypertension in her brother, mother, and sister; Lung cancer in her sister; Prostate cancer in her son; Stroke in her mother.    ROS:  Please see the history of present illness.   Otherwise, review of systems are positive for constipation.  All other systems are reviewed and negative.    PHYSICAL EXAM: VS:  BP 118/54 mmHg  Pulse 64  Ht 5\' 2"  (1.575 m)  Wt 122 lb 6.4 oz (55.52 kg)  BMI 22.38 kg/m2 , BMI Body mass index is 22.38 kg/(m^2). GEN: Well nourished, well developed, in no acute distress Neck: no JVD, HJR, carotid bruits, or masses Cardiac: RRR with some skipping; 2 to 3/6 systolic murmur at the left sternal border, no gallop, rubs, thrill or heave,  Respiratory:  clear to auscultation bilaterally, normal work of breathing GI: soft, nontender, nondistended, +  BS MS: no deformity or atrophy Extremities: without cyanosis, clubbing, edema, good distal pulses bilaterally.  Skin: warm and dry, no rash Neuro:  Strength and sensation are intact    EKG:  EKG is not ordered today.    Recent Labs: 12/22/2014: BUN 28*; Creatinine, Ser 1.71*; Hemoglobin 9.9*; Platelets 181; Potassium 4.1; Sodium 139    Lipid Panel    Component Value Date/Time   CHOL 143 03/05/2012 0918   TRIG 137.0 03/05/2012 0918   HDL 53.40 03/05/2012 [id number]  CHOLHDL 3 03/05/2012 0918   VLDL 27.4 03/05/2012 0918   LDLCALC 62 03/05/2012 0918      Wt Readings from Last 3 Encounters:  03/20/15 122 lb 6.4 oz (55.52 kg)  01/24/15 122 lb (55.339 kg)  01/20/15 122 lb (55.339 kg)      Other studies Reviewed: Additional studies/ records that were reviewed today include and review of the records demonstrates:  Myoview 2012Impression Exercise Capacity:  Good exercise capacity. BP Response:  Normal blood pressure response. Clinical Symptoms:  No chest pain. ECG Impression:  No significant ST segment change suggestive of ischemia. Comparison with Prior Nuclear Study: No previous nuclear study performed  Overall Impression:  Normal stress nuclear study.  Carotid Dopplers 12/21/13 less than 40% stenosis. No change from 2014.     ASSESSMENT AND PLAN: DIASTOLIC HEART FAILURE, CHRONIC Heart failure compensated. No evidence on exam.  Atrial fibrillation Patient is maintained on diltiazem and Eliquis 2.5 mg BID. F/U with Dr. Aundra Dubin in 6 months  Essential hypertension Blood pressure excellent control     Signed, Ermalinda Barrios, PA-C  03/20/2015 1:55 PM    [facility] [place], [place], [place]  [address] Phone: [phone]; Fax: [phone]

## 2015-03-20 NOTE — Assessment & Plan Note (Signed)
Blood pressure excellent control

## 2015-03-20 NOTE — Assessment & Plan Note (Addendum)
Patient is maintained on diltiazem and Eliquis 2.5 mg BID. F/U with Dr. Aundra Dubin in 6 months

## 2015-03-20 NOTE — Patient Instructions (Addendum)
Medication Instructions:  Your physician recommends that you continue on your current medications as directed. Please refer to the Current Medication list given to you today.  Labwork: NONE  Testing/Procedures: NONE  Follow-Up: Your physician wants you to follow-up in: 6 months with Dr. Aundra Dubin. You will receive a reminder letter in the mail two months in advance. If you don't receive a letter, please call our office to schedule the follow-up appointment.  If you need a refill on your cardiac medications before your next appointment, please call your pharmacy.

## 2015-03-20 NOTE — Assessment & Plan Note (Signed)
Heart failure compensated. No evidence on exam.

## 2015-03-21 ENCOUNTER — Other Ambulatory Visit: Payer: Self-pay | Admitting: Cardiology

## 2015-03-30 ENCOUNTER — Other Ambulatory Visit: Payer: Self-pay | Admitting: Cardiology

## 2015-05-10 ENCOUNTER — Telehealth: Payer: Self-pay | Admitting: Internal Medicine

## 2015-05-10 NOTE — Telephone Encounter (Signed)
Received and completed the form. Will need MR's signature. Once signed will update chart and contact grand daughter when completed.

## 2015-05-11 ENCOUNTER — Telehealth: Payer: Self-pay | Admitting: Cardiology

## 2015-05-11 NOTE — Telephone Encounter (Signed)
Walk in pt form-[employer] form dropped off Webb Silversmith back Friday 1/13 will give to her then

## 2015-05-12 NOTE — Telephone Encounter (Signed)
Has this form been signed by MR? Thanks.

## 2015-05-15 NOTE — Telephone Encounter (Signed)
MR do you have these forms? I looked in [place] look at and did not see them. thanks

## 2015-05-17 NOTE — Telephone Encounter (Signed)
i jus filled out a bunch of forms 05/17/2015 . Will drop it off 05/18/15. Do not remember a form for her

## 2015-05-17 NOTE — Telephone Encounter (Signed)
Spoke with pt's daughter. Advised her that we are waiting to get this form back from MR. She verbalized understanding.  MR - do you have this form??

## 2015-05-17 NOTE — Telephone Encounter (Signed)
[phone] calling back Vivien Rota

## 2015-05-17 NOTE — Telephone Encounter (Signed)
lmtcb x1 for Toni. 

## 2015-05-17 NOTE — Telephone Encounter (Signed)
[name] (daughter)- [phone] Checking the status of these forms, has not heard anything.

## 2015-05-18 NOTE — Telephone Encounter (Signed)
Form received by MR Called LM for Lelan Pons informing her that form was received (and signed by MR) - advised Lelan Pons will call Vivien Rota to see if we could catch her Called spoke with Vivien Rota and advised that Patient Assistance form is signed by MR and ready for pick up/mail Vivien Rota would like to pick this up Form placed up front to be picked up at [employer] - she is aware that pt still needs to sign the form before [facility] will accept it  Nothing further needed; will sign off

## 2015-05-23 ENCOUNTER — Other Ambulatory Visit: Payer: Self-pay

## 2015-05-23 MED ORDER — APIXABAN 2.5 MG PO TABS: 2.5000 mg | ORAL_TABLET | Freq: Two times a day (BID) | ORAL | Status: DC

## 2015-05-23 NOTE — Telephone Encounter (Signed)
Patient's grandaughter walks in today, expecting to pick up forms for assist with Eliquis. I advised her we have not seen any forms. We were actually waiting for her to return them to [place].I I gave her new [place] application for Eliquis.

## 2015-05-24 ENCOUNTER — Telehealth: Payer: Self-pay | Admitting: Internal Medicine

## 2015-05-24 NOTE — Telephone Encounter (Signed)
Spoke with pt's granddaughter, Lelan Pons. States that she picked up patient assistance paperwork for Spiriva Respimat. A prescription was not attached with these. She is going to bring these back to [place] so that we can fax these papers in along with the prescription. These will be brought back this week some time >> attn Daneil Dan.

## 2015-05-29 NOTE — Telephone Encounter (Signed)
Patient's daughter, Lelan Pons, brought by papers for patient assistance for Spiriva Respimat. Reviewed papers and made sure everything was attached. Placed papers in MR's box for [facility]

## 2015-05-29 NOTE — Telephone Encounter (Signed)
Called and spoke with [name], she said that she will be dropping off the papers this afternoon. I reminded her to make sure that she has a copy of patient's proof of income with the papers. She said she would make sure it is all together so [name] can just fax it all with the Rx without any problems.  To [name] for follow up

## 2015-05-29 NOTE — Telephone Encounter (Signed)
Pt here with papers wants to be sure they are correct before she leaves

## 2015-06-02 MED ORDER — TIOTROPIUM BROMIDE MONOHYDRATE 2.5 MCG/ACT IN AERS: INHALATION_SPRAY | RESPIRATORY_TRACT | Status: AC

## 2015-06-02 NOTE — Telephone Encounter (Signed)
Received forms and script has been printed form MR to sign, he is in office today 06/02/2015. Will contact pt's daughter once completed.

## 2015-06-05 NOTE — Telephone Encounter (Signed)
Daneil Dan please advise if this was done.  Thanks!

## 2015-06-05 NOTE — Telephone Encounter (Addendum)
Yes,this was completed and faxed this morning 2.6.17. Called and spoke to pt's grand daughter, Lelan Pons, and informed her the [employer] pt assistance forms were faxed for her spiriva. Will keep note open to update pt once decision has been made.

## 2015-06-07 NOTE — Telephone Encounter (Signed)
Forms are still being processed. Will call back in a few days to check on status

## 2015-06-09 NOTE — Telephone Encounter (Signed)
I have not recieved any update on pt assistance. Called [employer] pt assistance at [phone] and spoke to [place] and was advised they received the rx and documents on 2.6.17 and it is currently being processed and should be shipped to pt's home 10-14 business days from the day they received the information. Called and spoke to Lelan Pons, pt's granddaughter, and informed her of the approval and shipment details. Advised Lelan Pons that if they do not receive the shipment in 2 weeks then to call our office. Lelan Pons verbalized understanding and denied any further questions or concerns at this time.

## 2015-06-09 NOTE — Telephone Encounter (Signed)
Have we received anything on this pt's paperwork? Thanks.

## 2015-06-19 ENCOUNTER — Telehealth (HOSPITAL_COMMUNITY): Payer: Self-pay | Admitting: Pharmacist

## 2015-06-19 NOTE — Telephone Encounter (Signed)
Eliquis BMS patient assistance approved. Patient will receive at no charge to her.   Ruta Hinds. Velva Harman, PharmD, BCPS, CPP Clinical Pharmacist Pager: [phone] Phone: [phone] 06/19/2015 10:39 AM

## 2015-09-13 ENCOUNTER — Other Ambulatory Visit: Payer: Self-pay | Admitting: Cardiology

## 2015-09-26 ENCOUNTER — Other Ambulatory Visit: Payer: Self-pay | Admitting: Cardiology

## 2015-10-02 ENCOUNTER — Other Ambulatory Visit: Payer: Self-pay | Admitting: Cardiology

## 2015-10-20 ENCOUNTER — Other Ambulatory Visit: Payer: Self-pay | Admitting: Cardiology

## 2016-01-24 ENCOUNTER — Encounter: Payer: Self-pay | Admitting: Family

## 2016-01-30 ENCOUNTER — Ambulatory Visit (HOSPITAL_COMMUNITY)
Admission: RE | Admit: 2016-01-30 | Discharge: 2016-01-30 | Disposition: A | Discharge status: Home / Self Care | Payer: Medicare HMO | Source: Ambulatory Visit | Attending: Family | Admitting: Family

## 2016-01-30 ENCOUNTER — Encounter: Payer: Self-pay | Admitting: Family

## 2016-01-30 ENCOUNTER — Ambulatory Visit (INDEPENDENT_AMBULATORY_CARE_PROVIDER_SITE_OTHER): Payer: Medicare HMO | Admitting: Family

## 2016-01-30 VITALS — BP 126/64 | HR 66 | Temp 97.8°F | Resp 20 | Ht 62.0 in | Wt 113.0 lb

## 2016-01-30 DIAGNOSIS — I6521 Occlusion and stenosis of right carotid artery: Secondary | ICD-10-CM | POA: Diagnosis not present

## 2016-01-30 DIAGNOSIS — Z48812 Encounter for surgical aftercare following surgery on the circulatory system: Secondary | ICD-10-CM | POA: Insufficient documentation

## 2016-01-30 DIAGNOSIS — Z9889 Other specified postprocedural states: Secondary | ICD-10-CM | POA: Diagnosis not present

## 2016-01-30 DIAGNOSIS — I6522 Occlusion and stenosis of left carotid artery: Secondary | ICD-10-CM | POA: Insufficient documentation

## 2016-01-30 NOTE — Progress Notes (Signed)
Chief Complaint: Follow up Extracranial Carotid Artery Stenosis   History of Present Illness  [name] is a 82 y.o. female patient of Dr. Kellie Simmering who is status post right carotid endarterectomy in 2011. She returns today for follow up. She had a TIA before 2001 as manifested by expressive aphasia, denies hemiparesis, denies monocular loss of vision; no TIA or stroke symptoms since then. She denies tingling, numbness, pain, or cold sensation in either hand or arm.  Pt denies any claudication symptoms with walking.  She fell about July or August 2016 and fractured a few ribs, pain has resolved.  Pt Diabetic: No Pt smoker: non-smoker, but she had lifelong exposure to second hand smoke and has COPD  Pt meds include: Statin : Yes ASA: No: she had rectal bleeding on coumadin which was stopped, then Eliquis tried and she again had rectal bleeding, per daughter. Rectal bleeding is thought to be facilitated by radiation treatment for uterine cancer after her hysterectomy. Other anticoagulants/antiplatelets: Eliquis   Past Medical History:  Diagnosis Date  . Allergic rhinitis, cause unspecified   . Anemia   . Arrhythmia atrial fib  . Atrial fibrillation ([place])    paroxymal  . Carcinoma of uterus [facility]) Feb. 2012   High-grade serous stage Ia/vaginal cuff recurrence July 2012  . Carotid artery occlusion    carotid endarterectomy     . COPD (chronic obstructive pulmonary disease) ([place])   . Coronary artery disease   . Diverticulitis   . Hiatal hernia   . History of blood transfusion 08-26-13   1 unit given 08-26-13, 3 units given jan 2015  . History of home oxygen therapy    uses oxygen 2 liters per nasal cannula at hs  . Hyperlipidemia   . Osteoporosis   . Rectal bleeding 05-12-13   in hospital 4 days  . SOB (shortness of breath)    unexplained SOB onset fall 2010. PFT normal 06-12-09. Right heatrt Cath 06-26-09 nl PA but wedge 23 with non obst CAD, echo same day: Left  ventricle: the cavity size was normal. Wall thickness was increased in a pattern of mild LVH. Systolic function was vigorous. Estimated ejection fraction 65% to 70%. Left atrium mildly dilated. nml walking sats 11-17-2009  . Stroke ([place])    Mini  . TIA (transient ischemic attack) yrs ago  . Unspecified essential hypertension   . Unspecified hypothyroidism     Social History Social History  Substance Use Topics  . Smoking status: Passive Smoke Exposure - Never Smoker  . Smokeless tobacco: Never Used  . Alcohol use No    Family History Family History  Problem Relation Age of Onset  . Heart disease Mother   . Hypertension Mother   . Stroke Mother   . Heart disease Sister 96    Heart Disease before age 50  . Lung cancer Sister   . COPD Sister   . Hypertension Sister   . Heart disease Brother     Before age 34  . Cancer Brother   . Hypertension Brother   . Heart attack Brother   . Coronary artery disease Other   . Breast cancer Daughter 59  . Heart attack Daughter   . Heart disease Daughter     Before age 38  . Prostate cancer Son   . Heart attack Son     Surgical History Past Surgical History:  Procedure Laterality Date  . ABDOMINAL HYSTERECTOMY  April 20,2012   by Dr. Rhodia Albright at  [place] ENDARTERECTOMY  08/04/2009   Right  CEA  . CAROTID ENDARTERECTOMY    . CHOLECYSTECTOMY  yrs ago  . External Radiation  May 7,2013- October 07, 2011   25 treatments   . FLEXIBLE SIGMOIDOSCOPY N/A 09/02/2013   Procedure: FLEXIBLE SIGMOIDOSCOPY;  Surgeon: Jerene Bears, MD;  Location: [facility] ENDOSCOPY;  Service: Gastroenterology;  Laterality: N/A;  . Internal Radiation  June 13,2013 and  October 24, 2011   5 dose by Dr. Belva Bertin  . TUBAL LIGATION  1970    Allergies  Allergen Reactions  . Albuterol Swelling and Other (See Comments)    Pt states she was shaking  devloped tremors, swelling during PFT following albuterol    Current Outpatient Prescriptions  Medication Sig  Dispense Refill  . acetaminophen (TYLENOL) 500 MG tablet Take 500 mg by mouth every 6 (six) hours as needed for mild pain.    Marland Kitchen apixaban (ELIQUIS) 2.5 MG TABS tablet Take 1 tablet (2.5 mg total) by mouth 2 (two) times daily. 180 tablet 3  . bisoprolol (ZEBETA) 5 MG tablet Take 5 mg by mouth every morning.    . Calcium Carb-Cholecalciferol (CALCIUM 600 + D) 600-200 MG-UNIT TABS Take 1 tablet by mouth 2 (two) times daily.    Marland Kitchen diltiazem (DILACOR XR) 240 MG 24 hr capsule TAKE ONE CAPSULE BY MOUTH EVERY EVENING 30 capsule 1  . furosemide (LASIX) 40 MG tablet TAKE 1 TABLET BY MOUTH EVERY MORNING AND TAKE 1/2 TABLET BY MOUTH EVERY EVENING 45 tablet 5  . iron polysaccharides (FERREX 150) 150 MG capsule Take 150 mg by mouth 2 (two) times daily.    Marland Kitchen KLOR-CON M20 20 MEQ tablet TAKE 1 TABLET BY MOUTH TWICE A DAY 180 tablet 1  . levothyroxine (SYNTHROID, LEVOTHROID) 50 MCG tablet Take 50 mcg by mouth daily before breakfast.    . LORazepam (ATIVAN) 0.5 MG tablet Take 1 tablet by mouth at bedtime as needed (for sleep).     . nitroGLYCERIN (NITROSTAT) 0.4 MG SL tablet Place 0.4 mg under the tongue every 5 (five) minutes as needed for chest pain.     Marland Kitchen oxybutynin (DITROPAN) 5 MG tablet Take 5 mg by mouth 2 (two) times daily.    . simvastatin (ZOCOR) 40 MG tablet TAKE 1 TABLET BY MOUTH EVERY EVENING 30 tablet 4  . Tiotropium Bromide Monohydrate (SPIRIVA RESPIMAT) 2.5 MCG/ACT AERS INHALE 2 PUFFS INTO THE LUNGS DAILY. 4 g 11   No current facility-administered medications for this visit.     Review of Systems : See HPI for pertinent positives and negatives.  Physical Examination  Vitals:   01/30/16 1433 01/30/16 1434  BP: 121/69 126/64  Pulse: 66   Resp: 20   Temp: 97.8 F (36.6 C)   SpO2: 100%   Weight: 113 lb (51.3 kg)   Height: 5\' 2"  (1.575 m)    Body mass index is 20.67 kg/m.  General: WDWN female in NAD GAIT: normal Eyes: PERRLA Pulmonary: Respirations are non-labored, CTAB but diminished  air movement in all fields, no rales,  Rhonchi, or wheezing.  Cardiac: regular rhythm, no detected murmur.  VASCULAR EXAM Carotid Bruits Right Left   Negative Negative   Aorta is not palpable. Radial pulses are 2+ palpable and equal.   Gastrointestinal: soft, nontender, BS WNL, no r/g,no palpable masses.  Musculoskeletal: No muscle atrophy/wasting. M/S 5/5 throughout, Extremities without ischemic changes.  Neurologic: A&O X 3; Appropriate Affect, Speech is normal CN 2-12 intact except is  hard of hearing, Pain and light touch intact in extremities, Motor exam as listed above.          Assessment: [name] is a 82 y.o. female who is status post right carotid endarterectomy in 2011. She had a TIA in 2001, no TIA or stroke subsequently.   DATA Today's carotid duplex suggests a patent right internal carotid artery with history of carotid endarterectomy, mild hyperplasia in the surgical bulb, no hemodynamically significant stenosis present. Left internal carotid artery stenosis present of less than 40%. Essentially unchanged since previous study on 12/21/2013 and 01/20/15.   Plan: Follow-up in 1 year with Carotid Duplex scan.   I discussed in depth with the patient the nature of atherosclerosis, and emphasized the importance of maximal medical management including strict control of blood pressure, blood glucose, and lipid levels, obtaining regular exercise, and continued cessation of smoking.  The patient is aware that without maximal medical management the underlying atherosclerotic disease process will progress, limiting the benefit of any interventions. The patient was given information about stroke prevention and what symptoms should prompt the patient to seek immediate medical care. Thank you for allowing [place] to participate in this patient's care.  Clemon Chambers, RN, MSN, FNP-C [facility] Office:  [phone]  Clinic Physician: Scot Dock on call  01/30/16 2:46 PM

## 2016-01-30 NOTE — Patient Instructions (Signed)
Stroke Prevention Some medical conditions and behaviors are associated with an increased chance of having a stroke. You may prevent a stroke by making healthy choices and managing medical conditions. HOW CAN I REDUCE MY RISK OF HAVING A STROKE?   Stay physically active. Get at least 30 minutes of activity on most or all days.  Do not smoke. It may also be helpful to avoid exposure to secondhand smoke.  Limit alcohol use. Moderate alcohol use is considered to be:  No more than 2 drinks per day for men.  No more than 1 drink per day for nonpregnant women.  Eat healthy foods. This involves:  Eating 5 or more servings of fruits and vegetables a day.  Making dietary changes that address high blood pressure (hypertension), high cholesterol, diabetes, or obesity.  Manage your cholesterol levels.  Making food choices that are high in fiber and low in saturated fat, trans fat, and cholesterol may control cholesterol levels.  Take any prescribed medicines to control cholesterol as directed by your health care provider.  Manage your diabetes.  Controlling your carbohydrate and sugar intake is recommended to manage diabetes.  Take any prescribed medicines to control diabetes as directed by your health care provider.  Control your hypertension.  Making food choices that are low in salt (sodium), saturated fat, trans fat, and cholesterol is recommended to manage hypertension.  Ask your health care provider if you need treatment to lower your blood pressure. Take any prescribed medicines to control hypertension as directed by your health care provider.  If you are 18-39 years of age, have your blood pressure checked every 3-5 years. If you are 40 years of age or older, have your blood pressure checked every year.  Maintain a healthy weight.  Reducing calorie intake and making food choices that are low in sodium, saturated fat, trans fat, and cholesterol are recommended to manage  weight.  Stop drug abuse.  Avoid taking birth control pills.  Talk to your health care provider about the risks of taking birth control pills if you are over 35 years old, smoke, get migraines, or have ever had a blood clot.  Get evaluated for sleep disorders (sleep apnea).  Talk to your health care provider about getting a sleep evaluation if you snore a lot or have excessive sleepiness.  Take medicines only as directed by your health care provider.  For some people, aspirin or blood thinners (anticoagulants) are helpful in reducing the risk of forming abnormal blood clots that can lead to stroke. If you have the irregular heart rhythm of atrial fibrillation, you should be on a blood thinner unless there is a good reason you cannot take them.  Understand all your medicine instructions.  Make sure that other conditions (such as anemia or atherosclerosis) are addressed. SEEK IMMEDIATE MEDICAL CARE IF:   You have sudden weakness or numbness of the face, arm, or leg, especially on one side of the body.  Your face or eyelid droops to one side.  You have sudden confusion.  You have trouble speaking (aphasia) or understanding.  You have sudden trouble seeing in one or both eyes.  You have sudden trouble walking.  You have dizziness.  You have a loss of balance or coordination.  You have a sudden, severe headache with no known cause.  You have new chest pain or an irregular heartbeat. Any of these symptoms may represent a serious problem that is an emergency. Do not wait to see if the symptoms will   go away. Get medical help at once. Call your local emergency services (911 in [place] Do not drive yourself to the hospital.   This information is not intended to replace advice given to you by your health care provider. Make sure you discuss any questions you have with your health care provider.   Document Released: 05/23/2004 Document Revised: 05/06/2014 Document Reviewed:  10/16/2012 Elsevier Interactive Patient Education 2016 Elsevier Inc.  

## 2016-02-02 NOTE — Addendum Note (Signed)
Addended by: Kaleen Mask on: 02/02/2016 04:57 PM   Modules accepted: Orders

## 2016-02-28 ENCOUNTER — Other Ambulatory Visit: Payer: Self-pay

## 2016-02-28 MED ORDER — APIXABAN 2.5 MG PO TABS: 2.5000 mg | ORAL_TABLET | Freq: Two times a day (BID) | ORAL | 3 refills | Status: DC

## 2016-03-08 ENCOUNTER — Telehealth: Payer: Self-pay | Admitting: Internal Medicine

## 2016-03-08 NOTE — Telephone Encounter (Signed)
lmtcb for pt. Pt has not been seen in over 1 year in clinic with no pending appointments.

## 2016-03-11 NOTE — Telephone Encounter (Signed)
Spoke with pt granddaughter Lelan Pons, requesting patient assistance for the Spiriva. Pt is already established with [employer] for assistance and this would be a renewal. Lelan Pons aware to contact [employer] and have them send [place] a renewal form for Patient Assistance as she already has an existing application with them. Marie aware of fax number to have this sent to. Will send to [facility] to keep an eye out for this form to be faxed.

## 2016-03-11 NOTE — Telephone Encounter (Signed)
[phone] Lelan Pons calling back Daughter

## 2016-03-11 NOTE — Telephone Encounter (Signed)
lmtcb x2 for pt. 

## 2016-03-18 NOTE — Telephone Encounter (Signed)
[name] please advise if you've received forms on this patient yet.  Thanks!

## 2016-03-19 ENCOUNTER — Other Ambulatory Visit: Payer: Self-pay | Admitting: Cardiology

## 2016-03-20 ENCOUNTER — Telehealth: Payer: Self-pay | Admitting: Internal Medicine

## 2016-03-20 NOTE — Telephone Encounter (Signed)
Spoke with patient granddaughter - states that she will let us know when she receives the forms and she will bring them by for MR to sign off and attach Rx. Nothing further needed.

## 2016-03-20 NOTE — Telephone Encounter (Signed)
This is an Pharmacist, hospital for MR and Daneil Dan. Will forward to [place] to keep eye out for paperwork.

## 2016-03-20 NOTE — Telephone Encounter (Signed)
lmtcb for [name]. I do not have form.

## 2016-03-20 NOTE — Telephone Encounter (Signed)
Lelan Pons calling back - advised that MR doesn't have the forms. She is going to call and have forms refaxed - she needs nothing further -pr

## 2016-03-24 ENCOUNTER — Other Ambulatory Visit: Payer: Self-pay | Admitting: Cardiology

## 2016-03-25 ENCOUNTER — Other Ambulatory Visit: Payer: Self-pay | Admitting: Cardiology

## 2016-03-29 NOTE — Telephone Encounter (Signed)
I have not received any paperwork for pt. Called and spoke pt's granddaughter, Lelan Pons. Lelan Pons states she has not received the information from [facility] but once she does she will bring it by the office. Will sign off on this note as a new note will be opened when form is brought in.

## 2016-04-02 ENCOUNTER — Other Ambulatory Visit: Payer: Self-pay | Admitting: Cardiology

## 2016-04-19 ENCOUNTER — Other Ambulatory Visit: Payer: Self-pay | Admitting: Cardiology

## 2016-04-25 ENCOUNTER — Other Ambulatory Visit: Payer: Self-pay | Admitting: Cardiology

## 2016-04-25 NOTE — Telephone Encounter (Signed)
KLOR-CON M20 20 MEQ tablet  Medication  Date: 03/27/2016 Department: [address] Office Ordering/Authorizing: Larey Dresser, MD  Order Providers   Prescribing Provider Encounter Provider  Larey Dresser, MD Larey Dresser, MD  Medication Detail    Disp Refills Start End   KLOR-CON M20 20 MEQ tablet 60 tablet 0 03/27/2016    Sig: TAKE 1 TABLET BY MOUTH TWICE A DAY   Notes to Pharmacy: Please call our office to schedule an yearly appointment for November for future refills. [phone]. Thank you 1st attempt   E-Prescribing Status: Receipt confirmed by pharmacy (03/27/2016 10:33 AM EST)   Pharmacy   [id number] - [place], [place]

## 2016-05-16 ENCOUNTER — Other Ambulatory Visit: Payer: Self-pay | Admitting: Cardiology

## 2016-05-21 ENCOUNTER — Other Ambulatory Visit: Payer: Self-pay | Admitting: Cardiology

## 2016-05-24 ENCOUNTER — Encounter: Payer: Self-pay | Admitting: *Deleted

## 2016-05-27 ENCOUNTER — Ambulatory Visit (INDEPENDENT_AMBULATORY_CARE_PROVIDER_SITE_OTHER): Payer: PPO | Admitting: Physician Assistant

## 2016-05-27 ENCOUNTER — Encounter: Payer: Self-pay | Admitting: Physician Assistant

## 2016-05-27 ENCOUNTER — Encounter (INDEPENDENT_AMBULATORY_CARE_PROVIDER_SITE_OTHER): Payer: Self-pay

## 2016-05-27 VITALS — BP 100/52 | HR 79 | Ht 62.0 in | Wt 103.0 lb

## 2016-05-27 DIAGNOSIS — I5032 Chronic diastolic (congestive) heart failure: Secondary | ICD-10-CM

## 2016-05-27 DIAGNOSIS — R8299 Other abnormal findings in urine: Secondary | ICD-10-CM | POA: Diagnosis not present

## 2016-05-27 DIAGNOSIS — R42 Dizziness and giddiness: Secondary | ICD-10-CM

## 2016-05-27 DIAGNOSIS — N183 Chronic kidney disease, stage 3 (moderate): Secondary | ICD-10-CM | POA: Diagnosis not present

## 2016-05-27 DIAGNOSIS — E038 Other specified hypothyroidism: Secondary | ICD-10-CM | POA: Diagnosis not present

## 2016-05-27 DIAGNOSIS — R7302 Impaired glucose tolerance (oral): Secondary | ICD-10-CM | POA: Diagnosis not present

## 2016-05-27 DIAGNOSIS — I10 Essential (primary) hypertension: Secondary | ICD-10-CM

## 2016-05-27 DIAGNOSIS — M810 Age-related osteoporosis without current pathological fracture: Secondary | ICD-10-CM | POA: Diagnosis not present

## 2016-05-27 DIAGNOSIS — I480 Paroxysmal atrial fibrillation: Secondary | ICD-10-CM

## 2016-05-27 NOTE — Patient Instructions (Addendum)
Medication Instructions:  Your physician has recommended you make the following change in your medication:  1- STOP Lasix 2- STOP Potassium  Labwork: NONE  Testing/Procedures: NONE  Follow-Up: Your physician wants you to follow-up in: 6 months with Dr. Meda Coffee. You will receive a reminder letter in the mail two months in advance. If you don't receive a letter, please call our office to schedule the follow-up appointment.  Your physician recommends that you schedule a follow-up appointment in: 1 month with Estella Husk PA.  If you need a refill on your cardiac medications before your next appointment, please call your pharmacy.

## 2016-05-27 NOTE — Progress Notes (Signed)
Cardiology Office Note    Date:  05/27/2016   ID:  [name], DOB 1923-09-03, MRN [id number]  PCP:  Geoffery Lyons, MD  Cardiologist: previous Dr. Aundra Dubin to be established with Dr. Meda Coffee  No chief complaint on file.   History of Present Illness:  [name] is a 83 y.o. female .She has COPD on home oxygen at night, diastolic heart failure, nonobstructive CAD on cath in 2011, Myoview in 2012 showed no ischemia or infarction. Paroxysmal atrial fibrillation on diltiazem. She was previously on Coumadin but it was switched to Eliquis after lower GI bleed. She had radiation proctitis and underwent APC ablation. She also has hypertension hyperlipidemia.  I last saw the patient 02/2015 and she was doing well. She comes in today accompanied by her daughter. She has been having dizzy spells especially when she stands up and she felt today. Her blood pressure is quite low and she has lost 19 pounds since I last saw her. She is not eating or drinking water much. She takes care of her husband with Alzheimer's. She denies any chest pain, palpitations, or edema. She still uses her oxygen at night. She had labs drawn by Dr. Joya Salm this morning. She has follow-up 06/03/16.     Past Medical History:  Diagnosis Date  . Allergic rhinitis, cause unspecified   . Anemia   . Arrhythmia atrial fib  . Atrial fibrillation ([place])    paroxymal  . Carcinoma of uterus [facility]) Feb. 2012   High-grade serous stage Ia/vaginal cuff recurrence July 2012  . Carotid artery occlusion    carotid endarterectomy     . COPD (chronic obstructive pulmonary disease) ([place])   . Coronary artery disease   . Diverticulitis   . Hiatal hernia   . History of blood transfusion 08-26-13   1 unit given 08-26-13, 3 units given jan 2015  . History of home oxygen therapy    uses oxygen 2 liters per nasal cannula at hs  . Hyperlipidemia   . Osteoporosis   . Rectal bleeding 05-12-13   in hospital 4 days  . SOB (shortness  of breath)    unexplained SOB onset fall 2010. PFT normal 06-12-09. Right heatrt Cath 06-26-09 nl PA but wedge 23 with non obst CAD, echo same day: Left ventricle: the cavity size was normal. Wall thickness was increased in a pattern of mild LVH. Systolic function was vigorous. Estimated ejection fraction 65% to 70%. Left atrium mildly dilated. nml walking sats 11-17-2009  . Stroke ([place])    Mini  . TIA (transient ischemic attack) yrs ago  . Unspecified essential hypertension   . Unspecified hypothyroidism     Past Surgical History:  Procedure Laterality Date  . ABDOMINAL HYSTERECTOMY  April 20,2012   by Dr. Rhodia Albright at  [place]  08/04/2009   Right  CEA  . CAROTID ENDARTERECTOMY    . CHOLECYSTECTOMY  yrs ago  . External Radiation  May 7,2013- October 07, 2011   25 treatments   . FLEXIBLE SIGMOIDOSCOPY N/A 09/02/2013   Procedure: FLEXIBLE SIGMOIDOSCOPY;  Surgeon: Jerene Bears, MD;  Location: [facility];  Service: Gastroenterology;  Laterality: N/A;  . Internal Radiation  June 13,2013 and  October 24, 2011   5 dose by Dr. Belva Bertin  . TUBAL LIGATION  1970    Current Medications: Outpatient Medications Prior to Visit  Medication Sig Dispense Refill  . acetaminophen (TYLENOL) 500 MG tablet Take 500 mg by mouth every 6 (  six) hours as needed for mild pain.    Marland Kitchen apixaban (ELIQUIS) 2.5 MG TABS tablet Take 1 tablet (2.5 mg total) by mouth 2 (two) times daily. 180 tablet 3  . bisoprolol (ZEBETA) 5 MG tablet Take 5 mg by mouth every morning.    . Calcium Carb-Cholecalciferol (CALCIUM 600 + D) 600-200 MG-UNIT TABS Take 1 tablet by mouth 2 (two) times daily.    Marland Kitchen diltiazem (CARDIZEM CD) 240 MG 24 hr capsule TAKE ONE CAPSULE BY MOUTH EVERY EVENING 30 capsule 11  . iron polysaccharides (FERREX 150) 150 MG capsule Take 150 mg by mouth 2 (two) times daily.    Marland Kitchen levothyroxine (SYNTHROID, LEVOTHROID) 50 MCG tablet Take 50 mcg by mouth daily before breakfast.    . LORazepam (ATIVAN)  0.5 MG tablet Take 1 tablet by mouth at bedtime as needed (for sleep).     . nitroGLYCERIN (NITROSTAT) 0.4 MG SL tablet Place 0.4 mg under the tongue every 5 (five) minutes as needed for chest pain.     Marland Kitchen oxybutynin (DITROPAN) 5 MG tablet Take 5 mg by mouth 2 (two) times daily.    . simvastatin (ZOCOR) 40 MG tablet Take 1 tablet (40 mg total) by mouth every evening. 30 tablet 1  . Tiotropium Bromide Monohydrate (SPIRIVA RESPIMAT) 2.5 MCG/ACT AERS INHALE 2 PUFFS INTO THE LUNGS DAILY. 4 g 11  . furosemide (LASIX) 40 MG tablet TAKE 1 TABLET BY MOUTH EVERY MORNING AND TAKE 1/2 TABLET BY MOUTH EVERY EVENING 30 tablet 0  . KLOR-CON M20 20 MEQ tablet TAKE 1 TABLET BY MOUTH TWICE A DAY 30 tablet 0   No facility-administered medications prior to visit.      Allergies:   Albuterol   Social History   Social History  . Marital status: Married    Spouse name: N/A  . Number of children: 7  . Years of education: N/A   Occupational History  . retired from [employer]   .  Retired   Social History Main Topics  . Smoking status: Passive Smoke Exposure - Never Smoker  . Smokeless tobacco: Never Used  . Alcohol use No  . Drug use: No  . Sexual activity: No   Other Topics Concern  . Not on file   Social History Narrative  . No narrative on file     Family History:  The patient's family history includes Breast cancer (age of onset: 62) in her daughter; COPD in her sister; Cancer in her brother; Coronary artery disease in her other; Heart attack in her brother, daughter, and son; Heart disease in her brother, daughter, and mother; Heart disease (age of onset: 75) in her sister; Hypertension in her brother, mother, and sister; Lung cancer in her sister; Prostate cancer in her son; Stroke in her mother.   ROS:   Please see the history of present illness.    Review of Systems  Constitution: Positive for weakness and weight loss.  HENT: Negative.   Eyes: Negative.   Cardiovascular: Positive for  dyspnea on exertion.  Respiratory: Negative.   Hematologic/Lymphatic: Negative.   Musculoskeletal: Negative.  Negative for joint pain.  Gastrointestinal: Negative.   Genitourinary: Negative.   Neurological: Positive for dizziness, light-headedness and loss of balance.   All other systems reviewed and are negative.   PHYSICAL EXAM:   VS:  BP (!) 100/52   Pulse 79   Ht 5\' 2"  (1.575 m)   Wt 103 lb (46.7 kg)   BMI 18.84 kg/m   Physical  Exam  GEN: Elderly, in no acute distress  Neck: no JVD, carotid bruits, or masses Cardiac:RRR; 2/6 systolic murmur at the left sternal border  Respiratory:  Decreased breath sounds but clear to auscultation bilaterally, normal work of breathing GI: soft, nontender, nondistended, + BS Ext: without cyanosis, clubbing, or edema, Good distal pulses bilaterally Skin: tenting Psych: euthymic mood, full affect  Wt Readings from Last 3 Encounters:  05/27/16 103 lb (46.7 kg)  01/30/16 113 lb (51.3 kg)  03/20/15 122 lb 6.4 oz (55.5 kg)      Studies/Labs Reviewed:   EKG:  EKG is ordered today.  The ekg ordered today demonstrates sinus arrhythmia with PACs, no acute change  Recent Labs: No results found for requested labs within last 8760 hours.   Lipid Panel    Component Value Date/Time   CHOL 143 03/05/2012 0918   TRIG 137.0 03/05/2012 0918   HDL 53.40 03/05/2012 0918   CHOLHDL 3 03/05/2012 0918   VLDL 27.4 03/05/2012 0918   LDLCALC 62 03/05/2012 0918    Additional studies/ records that were reviewed today include:    Myoview 2012Impression Exercise Capacity:  Good exercise capacity. BP Response:  Normal blood pressure response. Clinical Symptoms:  No chest pain. ECG Impression:  No significant ST segment change suggestive of ischemia. Comparison with Prior Nuclear Study: No previous nuclear study performed  Overall Impression:  Normal stress nuclear study.  Carotid Dopplers 12/21/13 less than 40% stenosis. No change from  2014.    ASSESSMENT:    1. Dizziness   2. Essential hypertension   3. Paroxysmal atrial fibrillation (HCC)   4. DIASTOLIC HEART FAILURE, CHRONIC      PLAN:  In order of problems listed above:  Dizziness with a fall today. I suspect is secondary to hypotension. She has lost 19 pounds in the past 14 months. She appears dehydrated. Labs were drawn by Dr. Joya Salm today. Will not repeat. We'll stop Lasix and potassium. I will see her back in one month. Her daughter will call if she has any ankle edema or worsening dyspnea. Request copy of labs.  Essential hypertension now with hypotension  Paroxysmal atrial fibrillation on low-dose Eliquis without bleeding difficulties. Also on diltiazem and metoprolol. These may need to be adjusted if her blood pressure remains low.  Diastolic heart failure compensated. To be established with Dr. Meda Coffee in 6 months.    Medication Adjustments/Labs and Tests Ordered: Current medicines are reviewed at length with the patient today.  Concerns regarding medicines are outlined above.  Medication changes, Labs and Tests ordered today are listed in the Patient Instructions below. Patient Instructions  Medication Instructions:  Your physician has recommended you make the following change in your medication:  1- STOP Lasix 2- STOP Potassium  Labwork: NONE  Testing/Procedures: NONE  Follow-Up: Your physician wants you to follow-up in: 6 months with Dr. Meda Coffee. You will receive a reminder letter in the mail two months in advance. If you don't receive a letter, please call our office to schedule the follow-up appointment.  Your physician recommends that you schedule a follow-up appointment in: 1 month with Estella Husk PA.  If you need a refill on your cardiac medications before your next appointment, please call your pharmacy.       Signed, Ermalinda Barrios, PA-C  05/27/2016 2:21 PM    [facility] [address],  [place], [place]  [address] Phone: [phone]; Fax: [phone]

## 2016-05-29 ENCOUNTER — Encounter: Payer: Self-pay | Admitting: *Deleted

## 2016-05-29 NOTE — Addendum Note (Signed)
Addended by: Marlis Edelson C on: 05/29/2016 07:39 AM   Modules accepted: Orders

## 2016-06-03 DIAGNOSIS — I10 Essential (primary) hypertension: Secondary | ICD-10-CM | POA: Diagnosis not present

## 2016-06-03 DIAGNOSIS — Z6820 Body mass index (BMI) 20.0-20.9, adult: Secondary | ICD-10-CM | POA: Diagnosis not present

## 2016-06-03 DIAGNOSIS — N183 Chronic kidney disease, stage 3 (moderate): Secondary | ICD-10-CM | POA: Diagnosis not present

## 2016-06-03 DIAGNOSIS — Z1389 Encounter for screening for other disorder: Secondary | ICD-10-CM | POA: Diagnosis not present

## 2016-06-03 DIAGNOSIS — I129 Hypertensive chronic kidney disease with stage 1 through stage 4 chronic kidney disease, or unspecified chronic kidney disease: Secondary | ICD-10-CM | POA: Diagnosis not present

## 2016-06-03 DIAGNOSIS — I2510 Atherosclerotic heart disease of native coronary artery without angina pectoris: Secondary | ICD-10-CM | POA: Diagnosis not present

## 2016-06-03 DIAGNOSIS — I509 Heart failure, unspecified: Secondary | ICD-10-CM | POA: Diagnosis not present

## 2016-06-03 DIAGNOSIS — Z0000 Encounter for general adult medical examination without abnormal findings: Secondary | ICD-10-CM | POA: Diagnosis not present

## 2016-06-03 DIAGNOSIS — J449 Chronic obstructive pulmonary disease, unspecified: Secondary | ICD-10-CM | POA: Diagnosis not present

## 2016-06-03 DIAGNOSIS — E784 Other hyperlipidemia: Secondary | ICD-10-CM | POA: Diagnosis not present

## 2016-06-03 DIAGNOSIS — D692 Other nonthrombocytopenic purpura: Secondary | ICD-10-CM | POA: Diagnosis not present

## 2016-06-03 DIAGNOSIS — I700 Atherosclerosis of aorta: Secondary | ICD-10-CM | POA: Diagnosis not present

## 2016-06-17 ENCOUNTER — Other Ambulatory Visit: Payer: Self-pay | Admitting: Cardiology

## 2016-06-19 ENCOUNTER — Telehealth: Payer: Self-pay | Admitting: Physician Assistant

## 2016-06-19 NOTE — Telephone Encounter (Signed)
I spoke with pt 2/21 @ 2:55PM and let her know to make sure she weighs before taking the lasix. I verbalized this with her daughter as well and she agreed. I told her to take the lasix and potassium today.

## 2016-06-19 NOTE — Telephone Encounter (Signed)
New Message    Pt c/o medication issue:  1. Name of Medication: LASIX  2. How are you currently taking this medication (dosage and times per day)? *pt not taking this medication any longer as of January   3. Are you having a reaction (difficulty breathing--STAT)? Yes, pt states that ankles and feet are swelling due to being taken off of Lasix  4. What is your medication issue? Blood pressure is normal but legs are swelling

## 2016-06-19 NOTE — Telephone Encounter (Signed)
Have her take a lasix and potassium today. Please weigh patient before taking lasix

## 2016-06-19 NOTE — Telephone Encounter (Signed)
New Message   Pt daughter is returning your call about the Lasix

## 2016-06-19 NOTE — Telephone Encounter (Signed)
Spoke with pt daughter, Vivien Rota, [place] on file, and she advised that she had already spoken with Apolonio Schneiders.

## 2016-06-19 NOTE — Telephone Encounter (Signed)
I called pt 2:29PM and asked:  Pt weight: Daughter states she does not know at this time - legs too swollen to figure out SOB: No SOB but has has SOB in the past Compression Hose: No compression hose at all - swelling began days ago  I offered appt at 2:15 PM tomorrow 06/20/16. Pt daughter agreed to bring her and she will arrive by 2PM. I have rescheduled her Monday appt.

## 2016-06-19 NOTE — Telephone Encounter (Signed)
Ask patient what her weight is and if she's short of breath? I could see her at 2:15 tomorrow or next week. She has an appt 3/5 but should probably be seen sooner. She could take a lasix and potassium today to help with swelling. Is she wearing compression hose?

## 2016-06-20 ENCOUNTER — Ambulatory Visit (INDEPENDENT_AMBULATORY_CARE_PROVIDER_SITE_OTHER): Payer: PPO | Admitting: Physician Assistant

## 2016-06-20 ENCOUNTER — Encounter: Payer: Self-pay | Admitting: Physician Assistant

## 2016-06-20 VITALS — BP 92/50 | HR 93 | Ht 60.0 in | Wt 115.8 lb

## 2016-06-20 DIAGNOSIS — I480 Paroxysmal atrial fibrillation: Secondary | ICD-10-CM | POA: Diagnosis not present

## 2016-06-20 DIAGNOSIS — I5032 Chronic diastolic (congestive) heart failure: Secondary | ICD-10-CM | POA: Diagnosis not present

## 2016-06-20 DIAGNOSIS — I959 Hypotension, unspecified: Secondary | ICD-10-CM | POA: Insufficient documentation

## 2016-06-20 DIAGNOSIS — I952 Hypotension due to drugs: Secondary | ICD-10-CM

## 2016-06-20 MED ORDER — FUROSEMIDE 20 MG PO TABS: 20.0000 mg | ORAL_TABLET | Freq: Every day | ORAL | 3 refills | Status: DC

## 2016-06-20 MED ORDER — POTASSIUM CHLORIDE ER 10 MEQ PO TBCR: 10.0000 meq | EXTENDED_RELEASE_TABLET | Freq: Every day | ORAL | 3 refills | Status: DC

## 2016-06-20 NOTE — Progress Notes (Addendum)
Cardiology Office Note    Date:  06/20/2016   ID:  [name], DOB 09-27-1923, MRN [id number]  PCP:  Geoffery Lyons, MD  Cardiologist: was Dr. Aundra Dubin to be established with Dr.Nelson  Chief Complaint  Patient presents with  . Leg Swelling    History of Present Illness:  [name] is a 83 y.o. female with history of COPD on home oxygen at night, diastolic heart failure, nonobstructive CAD on cath in 2011, [place] in 2012 showed no ischemia or infarction. Paroxysmal atrial fibrillation on diltiazem. She was previously on Coumadin but it was switched to Eliquis after lower GI bleed. She had radiation proctitis and underwent APC ablation. She also has hypertension hyperlipidemia.  I saw the patient 05/27/16 with extreme dizziness and 19 pound weight loss since her last office visit. She was not eating or drinking. She had a fall that day. I stopped her Lasix and potassium and she was scheduled to follow-up with me next week.  Patient's daughter called in yesterday saying she had increased lower extremity edema and 15 pound weight gain. She was advised to take Lasix yesterday and potassium and come in to see me. She said she's been trying to eat better but still does not drink water. Swelling got bad about a week ago. She avoid salt as much as she can and drinks Gatorade that is low sodium. Her dizziness has greatly improved.       Past Medical History:  Diagnosis Date  . Allergic rhinitis, cause unspecified   . Anemia   . Arrhythmia atrial fib  . Atrial fibrillation ([place])    paroxymal  . Carcinoma of uterus [facility]) Feb. 2012   High-grade serous stage Ia/vaginal cuff recurrence July 2012  . Carotid artery occlusion    carotid endarterectomy     . COPD (chronic obstructive pulmonary disease) ([place])   . Coronary artery disease   . Diverticulitis   . Hiatal hernia   . History of blood transfusion 08-26-13   1 unit given 08-26-13, 3 units given jan 2015  . History of  home oxygen therapy    uses oxygen 2 liters per nasal cannula at hs  . Hyperlipidemia   . Osteoporosis   . Rectal bleeding 05-12-13   in hospital 4 days  . SOB (shortness of breath)    unexplained SOB onset fall 2010. PFT normal 06-12-09. Right heatrt Cath 06-26-09 nl PA but wedge 23 with non obst CAD, echo same day: Left ventricle: the cavity size was normal. Wall thickness was increased in a pattern of mild LVH. Systolic function was vigorous. Estimated ejection fraction 65% to 70%. Left atrium mildly dilated. nml walking sats 11-17-2009  . Stroke ([place])    Mini  . TIA (transient ischemic attack) yrs ago  . Unspecified essential hypertension   . Unspecified hypothyroidism     Past Surgical History:  Procedure Laterality Date  . ABDOMINAL HYSTERECTOMY  April 20,2012   by Dr. Rhodia Albright at  [place]  08/04/2009   Right  CEA  . CAROTID ENDARTERECTOMY    . CHOLECYSTECTOMY  yrs ago  . External Radiation  May 7,2013- October 07, 2011   25 treatments   . FLEXIBLE SIGMOIDOSCOPY N/A 09/02/2013   Procedure: FLEXIBLE SIGMOIDOSCOPY;  Surgeon: Jerene Bears, MD;  Location: [facility];  Service: Gastroenterology;  Laterality: N/A;  . Internal Radiation  June 13,2013 and  October 24, 2011   5 dose by Dr. Belva Bertin  .  TUBAL LIGATION  1970    Current Medications: Outpatient Medications Prior to Visit  Medication Sig Dispense Refill  . acetaminophen (TYLENOL) 500 MG tablet Take 500 mg by mouth every 6 (six) hours as needed for mild pain.    Marland Kitchen apixaban (ELIQUIS) 2.5 MG TABS tablet Take 1 tablet (2.5 mg total) by mouth 2 (two) times daily. 180 tablet 3  . Calcium Carb-Cholecalciferol (CALCIUM 600 + D) 600-200 MG-UNIT TABS Take 1 tablet by mouth 2 (two) times daily.    Marland Kitchen diltiazem (CARDIZEM CD) 240 MG 24 hr capsule TAKE ONE CAPSULE BY MOUTH EVERY EVENING 30 capsule 11  . iron polysaccharides (FERREX 150) 150 MG capsule Take 150 mg by mouth 2 (two) times daily.    Marland Kitchen levothyroxine  (SYNTHROID, LEVOTHROID) 50 MCG tablet Take 50 mcg by mouth daily before breakfast.    . LORazepam (ATIVAN) 0.5 MG tablet Take 1 tablet by mouth at bedtime as needed (for sleep).     . nitroGLYCERIN (NITROSTAT) 0.4 MG SL tablet Place 0.4 mg under the tongue every 5 (five) minutes as needed for chest pain.     Marland Kitchen oxybutynin (DITROPAN) 5 MG tablet Take 5 mg by mouth 2 (two) times daily.    . Tiotropium Bromide Monohydrate (SPIRIVA RESPIMAT) 2.5 MCG/ACT AERS INHALE 2 PUFFS INTO THE LUNGS DAILY. 4 g 11  . bisoprolol (ZEBETA) 5 MG tablet Take 5 mg by mouth every morning.    . simvastatin (ZOCOR) 40 MG tablet TAKE 1 TABLET (40 MG TOTAL) BY MOUTH EVERY EVENING. (Patient not taking: Reported on 06/20/2016) 30 tablet 10   No facility-administered medications prior to visit.      Allergies:   Albuterol   Social History   Social History  . Marital status: Married    Spouse name: N/A  . Number of children: 7  . Years of education: N/A   Occupational History  . retired from [employer]   .  Retired   Social History Main Topics  . Smoking status: Passive Smoke Exposure - Never Smoker  . Smokeless tobacco: Never Used  . Alcohol use No  . Drug use: No  . Sexual activity: No   Other Topics Concern  . None   Social History Narrative  . None     Family History:  The patient's family history includes Breast cancer (age of onset: 33) in her daughter; COPD in her sister; Cancer in her brother; Coronary artery disease in her other; Heart attack in her brother, daughter, and son; Heart disease in her brother, daughter, and mother; Heart disease (age of onset: 69) in her sister; Hypertension in her brother, mother, and sister; Lung cancer in her sister; Prostate cancer in her son; Stroke in her mother.   ROS:   Please see the history of present illness.    Review of Systems  Constitution: Negative.  HENT: Positive for hearing loss.   Eyes: Negative.   Cardiovascular: Positive for dyspnea on exertion  and leg swelling.  Respiratory: Positive for shortness of breath.   Hematologic/Lymphatic: Negative.   Musculoskeletal: Negative.  Negative for joint pain.  Gastrointestinal: Negative.   Genitourinary: Negative.   Neurological: Negative.    All other systems reviewed and are negative.   PHYSICAL EXAM:   VS:  BP (!) 92/50   Pulse 93   Ht 5' (1.524 m)   Wt 115 lb 12.8 oz (52.5 kg)   SpO2 96%   BMI 22.62 kg/m   Physical Exam  GEN: Elderly, in  no acute distress  Neck: no JVD, carotid bruits, or masses Cardiac:RRR; no murmurs, rubs, or gallops  Respiratory:  clear to auscultation bilaterally, normal work of breathing GI: soft, nontender, nondistended, + BS Ext: +1-2 edema bilaterally up to her knees without cyanosis, clubbing,  Good distal pulses bilaterally Psych: euthymic mood, full affect  Wt Readings from Last 3 Encounters:  06/20/16 115 lb 12.8 oz (52.5 kg)  05/27/16 103 lb (46.7 kg)  01/30/16 113 lb (51.3 kg)      Studies/Labs Reviewed:   EKG:  EKG is not ordered today.   Recent Labs: No results found for requested labs within last 8760 hours.   Lipid Panel    Component Value Date/Time   CHOL 143 03/05/2012 0918   TRIG 137.0 03/05/2012 0918   HDL 53.40 03/05/2012 0918   CHOLHDL 3 03/05/2012 0918   VLDL 27.4 03/05/2012 0918   LDLCALC 62 03/05/2012 0918    Additional studies/ records that were reviewed today include:    Myoview 2012 Impression Exercise Capacity:  Good exercise capacity. BP Response:  Normal blood pressure response. Clinical Symptoms:  No chest pain. ECG Impression:  No significant ST segment change suggestive of ischemia. Comparison with Prior Nuclear Study: No previous nuclear study performed   Overall Impression:  Normal stress nuclear study.   Carotid Dopplers 12/21/13 less than 40% stenosis. No change from 2014.     ASSESSMENT:    1. DIASTOLIC HEART FAILURE, CHRONIC   2. Paroxysmal atrial fibrillation (HCC)   3. Hypotension  due to drugs      PLAN:  In order of problems listed above:  Diastolic heart failure with 15 pound weight gain since Lasix was stopped a month ago.  12 pounds on our scales but she did take Lasix yesterday. Lasix was stopped because of extreme dizziness, dehydration and orthostasis. We'll restart low-dose Lasix 20 mg once daily, Kdur 10 mEq once daily. I will see her back March 5. I've asked the daughter to weigh her every day and call us if her swelling resolves and her weight goes down too much before a see her back. F/U with Dr. Meda Coffee in 2 months to become established. Check bmet next office visit.   Paroxysmal atrial fibrillation on eliquis, diltiazem and bisoprolol which was decreased to 2.5 mg daily by Dr.Aaronson. HR is in the 90's so can't titrate any lower  Hypotension need to be careful with Lasix as her blood pressure is in the 90s. I can't decrease her diltiazem or bisoprolol because of a heart rate in the 90s. Family is to call if she has any dizziness.Compression hose.    Medication Adjustments/Labs and Tests Ordered: Current medicines are reviewed at length with the patient today.  Concerns regarding medicines are outlined above.  Medication changes, Labs and Tests ordered today are listed in the Patient Instructions below. Patient Instructions  Medication Instructions:  Please start Furosemide 20 mg a day and potassium chloride 10 MEQ a day. Continue all other medications as listed.  Pt needs daily weights Avoid sodium as much as possible.  Follow-Up: Follow up with Ermalinda Barrios as scheduled.  Follow up with Dr Meda Coffee in 2 months.  If you need a refill on your cardiac medications before your next appointment, please call your pharmacy.  Thank you for choosing [facility]!!        Signed, Ermalinda Barrios, PA-C  06/20/2016 2:53 PM    [place], [place]  [id number] Phone: [phone]; Fax: [phone]

## 2016-06-20 NOTE — Patient Instructions (Signed)
Medication Instructions:  Please start Furosemide 20 mg a day and potassium chloride 10 MEQ a day. Continue all other medications as listed.  Pt needs daily weights Avoid sodium as much as possible.  Follow-Up: Follow up with Ermalinda Barrios as scheduled.  Follow up with Dr Meda Coffee in 2 months.  If you need a refill on your cardiac medications before your next appointment, please call your pharmacy.  Thank you for choosing [name]!!

## 2016-06-25 ENCOUNTER — Other Ambulatory Visit: Payer: Self-pay | Admitting: *Deleted

## 2016-06-25 MED ORDER — APIXABAN 2.5 MG PO TABS: 2.5000 mg | ORAL_TABLET | Freq: Two times a day (BID) | ORAL | 5 refills | Status: DC

## 2016-06-25 MED ORDER — APIXABAN 2.5 MG PO TABS: 2.5000 mg | ORAL_TABLET | Freq: Two times a day (BID) | ORAL | 1 refills | Status: DC

## 2016-06-26 NOTE — Telephone Encounter (Signed)
error 

## 2016-07-01 ENCOUNTER — Ambulatory Visit (INDEPENDENT_AMBULATORY_CARE_PROVIDER_SITE_OTHER): Payer: PPO | Admitting: Physician Assistant

## 2016-07-01 ENCOUNTER — Encounter: Payer: Self-pay | Admitting: Physician Assistant

## 2016-07-01 ENCOUNTER — Ambulatory Visit: Payer: PPO | Admitting: Physician Assistant

## 2016-07-01 VITALS — BP 100/60 | HR 73 | Ht 60.0 in | Wt 111.1 lb

## 2016-07-01 DIAGNOSIS — I5032 Chronic diastolic (congestive) heart failure: Secondary | ICD-10-CM | POA: Diagnosis not present

## 2016-07-01 DIAGNOSIS — I952 Hypotension due to drugs: Secondary | ICD-10-CM | POA: Diagnosis not present

## 2016-07-01 DIAGNOSIS — I480 Paroxysmal atrial fibrillation: Secondary | ICD-10-CM

## 2016-07-01 NOTE — Patient Instructions (Addendum)
Medication Instructions:  Your physician recommends that you continue on your current medications as directed. Please refer to the Current Medication list given to you today. If you lose 5 more lbs, you may reduce the Lasix to 20 mg every other day as well as the Potassium.  Labwork: None ordered  Testing/Procedures: None ordered  Follow-Up: Your physician recommends that you schedule a follow-up appointment in: KEEP SCHEDULED APPT And schedule with Ermalinda Barrios, PA-C as you need.  Any Other Special Instructions Will Be Listed Below (If Applicable).   If you need a refill on your cardiac medications before your next appointment, please call your pharmacy.

## 2016-07-01 NOTE — Progress Notes (Addendum)
Cardiology Office Note    Date:  07/01/2016   ID:  [name], DOB December 26, 1923, MRN [id number]  PCP:  Geoffery Lyons, MD  Cardiologist: Dr. Meda Coffee  Chief Complaint  Patient presents with  . Follow-up    History of Present Illness:  [name] is a 83 y.o. female with history of COPD on home oxygen at night, diastolic heart failure, nonobstructive CAD on cath in 2011, [place] in 2012 showed no ischemia or infarction. Paroxysmal atrial fibrillation on diltiazem. She was previously on Coumadin but it was switched to Eliquis after lower GI bleed. She had radiation proctitis and underwent APC ablation. She also has hypertension hyperlipidemia.   I saw the patient 05/27/16 with extreme dizziness and 19 pound weight loss since her last office visit. She was not eating or drinking. She had a fall that day. I stopped her Lasix and potassium and she was scheduled to follow-up with me next week.   I saw the patient 06/20/16 with 15 pound weight gain. Her dizziness had greatly improved but swelling had come back over a week's time. I restarted Lasix 20 mg once daily and potassium 10 mEq once daily. I also ordered compression stockings.  Patient comes in today feeling better. She has lost 4 pounds. Her edema has improved with wearing compression stockings. She has not had any dizziness. She still has mild swelling but feels a lot better. She has some dyspnea on exertion.    Past Medical History:  Diagnosis Date  . Allergic rhinitis, cause unspecified   . Anemia   . Arrhythmia atrial fib  . Atrial fibrillation ([place])    paroxymal  . Carcinoma of uterus [facility]) Feb. 2012   High-grade serous stage Ia/vaginal cuff recurrence July 2012  . Carotid artery occlusion    carotid endarterectomy     . COPD (chronic obstructive pulmonary disease) ([place])   . Coronary artery disease   . Diverticulitis   . Hiatal hernia   . History of blood transfusion 08-26-13   1 unit given 08-26-13, 3 units  given jan 2015  . History of home oxygen therapy    uses oxygen 2 liters per nasal cannula at hs  . Hyperlipidemia   . Osteoporosis   . Rectal bleeding 05-12-13   in hospital 4 days  . SOB (shortness of breath)    unexplained SOB onset fall 2010. PFT normal 06-12-09. Right heatrt Cath 06-26-09 nl PA but wedge 23 with non obst CAD, echo same day: Left ventricle: the cavity size was normal. Wall thickness was increased in a pattern of mild LVH. Systolic function was vigorous. Estimated ejection fraction 65% to 70%. Left atrium mildly dilated. nml walking sats 11-17-2009  . Stroke ([place])    Mini  . TIA (transient ischemic attack) yrs ago  . Unspecified essential hypertension   . Unspecified hypothyroidism     Past Surgical History:  Procedure Laterality Date  . ABDOMINAL HYSTERECTOMY  April 20,2012   by Dr. Rhodia Albright at  [place]  08/04/2009   Right  CEA  . CAROTID ENDARTERECTOMY    . CHOLECYSTECTOMY  yrs ago  . External Radiation  May 7,2013- October 07, 2011   25 treatments   . FLEXIBLE SIGMOIDOSCOPY N/A 09/02/2013   Procedure: FLEXIBLE SIGMOIDOSCOPY;  Surgeon: Jerene Bears, MD;  Location: [facility];  Service: Gastroenterology;  Laterality: N/A;  . Internal Radiation  June 13,2013 and  October 24, 2011   5 dose by Dr.  Gerden  . TUBAL LIGATION  1970    Current Medications: Outpatient Medications Prior to Visit  Medication Sig Dispense Refill  . acetaminophen (TYLENOL) 500 MG tablet Take 500 mg by mouth every 6 (six) hours as needed for mild pain.    Marland Kitchen apixaban (ELIQUIS) 2.5 MG TABS tablet Take 1 tablet (2.5 mg total) by mouth 2 (two) times daily. 60 tablet 5  . bisoprolol (ZEBETA) 5 MG tablet Take 0.5 tablet by mouth daily    . Calcium Carb-Cholecalciferol (CALCIUM 600 + D) 600-200 MG-UNIT TABS Take 1 tablet by mouth 2 (two) times daily.    Marland Kitchen diltiazem (CARDIZEM CD) 240 MG 24 hr capsule TAKE ONE CAPSULE BY MOUTH EVERY EVENING 30 capsule 11  . furosemide  (LASIX) 20 MG tablet Take 1 tablet (20 mg total) by mouth daily. 90 tablet 3  . iron polysaccharides (FERREX 150) 150 MG capsule Take 150 mg by mouth 2 (two) times daily.    Marland Kitchen levothyroxine (SYNTHROID, LEVOTHROID) 50 MCG tablet Take 50 mcg by mouth daily before breakfast.    . LORazepam (ATIVAN) 0.5 MG tablet Take 1 tablet by mouth at bedtime as needed (for sleep).     . nitroGLYCERIN (NITROSTAT) 0.4 MG SL tablet Place 0.4 mg under the tongue every 5 (five) minutes as needed for chest pain.     Marland Kitchen oxybutynin (DITROPAN) 5 MG tablet Take 5 mg by mouth 2 (two) times daily.    . potassium chloride (K-DUR) 10 MEQ tablet Take 1 tablet (10 mEq total) by mouth daily. 90 tablet 3  . simvastatin (ZOCOR) 20 MG tablet Take 20 mg by mouth daily.    . Tiotropium Bromide Monohydrate (SPIRIVA RESPIMAT) 2.5 MCG/ACT AERS INHALE 2 PUFFS INTO THE LUNGS DAILY. 4 g 11   No facility-administered medications prior to visit.      Allergies:   Albuterol   Social History   Social History  . Marital status: Married    Spouse name: N/A  . Number of children: 7  . Years of education: N/A   Occupational History  . retired from [employer]   .  Retired   Social History Main Topics  . Smoking status: Passive Smoke Exposure - Never Smoker  . Smokeless tobacco: Never Used  . Alcohol use No  . Drug use: No  . Sexual activity: No   Other Topics Concern  . None   Social History Narrative  . None     Family History:  The patient's family history includes Breast cancer (age of onset: 31) in her daughter; COPD in her sister; Cancer in her brother; Coronary artery disease in her other; Heart attack in her brother, daughter, and son; Heart disease in her brother, daughter, and mother; Heart disease (age of onset: 61) in her sister; Hypertension in her brother, mother, and sister; Lung cancer in her sister; Prostate cancer in her son; Stroke in her mother.   ROS:   Please see the history of present illness.    Review of  Systems  Constitution: Positive for weakness, malaise/fatigue and weight loss.  Cardiovascular: Positive for dyspnea on exertion.   All other systems reviewed and are negative.   PHYSICAL EXAM:   VS:  BP 100/60   Pulse 73   Ht 5' (1.524 m)   Wt 111 lb 1.9 oz (50.4 kg)   SpO2 98%   BMI 21.70 kg/m   Physical Exam  GEN: Thin, elderly, in no acute distress Neck: no JVD, carotid bruits, or masses  Cardiac:RRR; 2/6 systolic murmur at the left sternal border, no rubs, or gallops  Respiratory:  clear to auscultation bilaterally, normal work of breathing GI: soft, nontender, nondistended, + BS Ext: Compression stockings in place, mild ankle edema without cyanosis, clubbing,  Good distal pulses bilaterally Psych: euthymic mood, full affect  Wt Readings from Last 3 Encounters:  07/01/16 111 lb 1.9 oz (50.4 kg)  06/20/16 115 lb 12.8 oz (52.5 kg)  05/27/16 103 lb (46.7 kg)      Studies/Labs Reviewed:   EKG:  EKG is ordered today.  The ekg ordered today demonstrates Normal sinus rhythm, no acute change  Recent Labs: No results found for requested labs within last 8760 hours.   Lipid Panel    Component Value Date/Time   CHOL 143 03/05/2012 0918   TRIG 137.0 03/05/2012 0918   HDL 53.40 03/05/2012 0918   CHOLHDL 3 03/05/2012 0918   VLDL 27.4 03/05/2012 0918   LDLCALC 62 03/05/2012 0918    Additional studies/ records that were reviewed today include:   Myoview 2012Impression Exercise Capacity:  Good exercise capacity. BP Response:  Normal blood pressure response. Clinical Symptoms:  No chest pain. ECG Impression:  No significant ST segment change suggestive of ischemia. Comparison with Prior Nuclear Study: No previous nuclear study performed   Overall Impression:  Normal stress nuclear study.   Carotid Dopplers 12/21/13 less than 40% stenosis. No change from 2014.     ASSESSMENT:    1. DIASTOLIC HEART FAILURE, CHRONIC   2. Paroxysmal atrial fibrillation (HCC)   3.  Hypotension due to drugs      PLAN:  In order of problems listed above:  Chronic diastolic heart failure improved with low-dose Lasix and compression stockings. We'll continue Lasix 20 mg once daily and potassium 10 mEq daily. If she loses 5 more pounds decrease Lasix and potassium to every other day. She has an appointment to see Dr. Meda Coffee in early May. I'm happy to see her back in the interim if needed. Labs drawn by Dr. Joya Salm 2/29/18 reviewed and creatinine 1.7 which is her  baseline. Will not draw labs today.  Paroxysmal atrial fibrillation on Eliquis 2.5 mg twice a day and bisoprolol  Hypotension and dehydration has been a problem in the past. Need to be very careful with Lasix. Patient's blood pressure is low today. She is not having any dizziness or orthostatic symptoms. Continue to monitor closely.  Medication Adjustments/Labs and Tests Ordered: Current medicines are reviewed at length with the patient today.  Concerns regarding medicines are outlined above.  Medication changes, Labs and Tests ordered today are listed in the Patient Instructions below. Patient Instructions  Medication Instructions:  Your physician recommends that you continue on your current medications as directed. Please refer to the Current Medication list given to you today. If you lose 5 more lbs, you may reduce the Lasix to 20 mg every other day as well as the Potassium.  Labwork: None ordered  Testing/Procedures: None ordered  Follow-Up: Your physician recommends that you schedule a follow-up appointment in: KEEP SCHEDULED APPT And schedule with Ermalinda Barrios, PA-C as you need.  Any Other Special Instructions Will Be Listed Below (If Applicable).   If you need a refill on your cardiac medications before your next appointment, please call your pharmacy.      Sumner Boast, PA-C  07/01/2016 11:41 AM    [facility] [place], [place],   [address] Phone:  [phone]; Fax: [phone]    

## 2016-08-05 ENCOUNTER — Other Ambulatory Visit: Payer: Self-pay | Admitting: Cardiology

## 2016-08-13 ENCOUNTER — Telehealth: Payer: Self-pay | Admitting: Physician Assistant

## 2016-08-13 NOTE — Telephone Encounter (Signed)
New message    Pt daughter is calling about pt. She states that when she brought her mother in on 3/5 she was told to change some medication after she lost 5lbs. Daughter states that pt has lost the weight but is confused and needs some help.

## 2016-08-13 NOTE — Telephone Encounter (Signed)
Pt's daughter [name] called for ask for help because on the last OFFICE VISIT  On 3/5 the PA recommends for pt if she lose  5 lbs to decrease the lasix and potassium from every day to every other day. Pt's daughter is aware to give pt  Lasix 20 mg every other day and Potassium 10 mEq every other day. Daughter [name] verbalized understanding.

## 2016-08-28 ENCOUNTER — Ambulatory Visit (INDEPENDENT_AMBULATORY_CARE_PROVIDER_SITE_OTHER): Payer: PPO | Admitting: Cardiology

## 2016-08-28 ENCOUNTER — Encounter: Payer: Self-pay | Admitting: Cardiology

## 2016-08-28 VITALS — BP 112/56 | HR 86 | Ht 60.0 in | Wt 114.0 lb

## 2016-08-28 DIAGNOSIS — R600 Localized edema: Secondary | ICD-10-CM | POA: Diagnosis not present

## 2016-08-28 DIAGNOSIS — I952 Hypotension due to drugs: Secondary | ICD-10-CM | POA: Diagnosis not present

## 2016-08-28 DIAGNOSIS — I480 Paroxysmal atrial fibrillation: Secondary | ICD-10-CM

## 2016-08-28 DIAGNOSIS — I5032 Chronic diastolic (congestive) heart failure: Secondary | ICD-10-CM | POA: Diagnosis not present

## 2016-08-28 DIAGNOSIS — I10 Essential (primary) hypertension: Secondary | ICD-10-CM | POA: Diagnosis not present

## 2016-08-28 MED ORDER — DILTIAZEM HCL ER COATED BEADS 120 MG PO CP24: 120.0000 mg | ORAL_CAPSULE | Freq: Every day | ORAL | 3 refills | Status: AC

## 2016-08-28 NOTE — Progress Notes (Signed)
Cardiology Office Note    Date:  08/29/2016   ID:  [name], DOB 06-18-1923, MRN [id number]  PCP:  Geoffery Lyons, MD  Cardiologist: Dr. Meda Coffee  Chief complain: Dizziness  History of Present Illness:  [name] is a 83 y.o. female with history of COPD on home oxygen at night, diastolic heart failure, nonobstructive CAD on cath in 2011, [place] in 2012 showed no ischemia or infarction. Paroxysmal atrial fibrillation on diltiazem. She was previously on Coumadin but it was switched to Eliquis after lower GI bleed. She had radiation proctitis and underwent APC ablation. She also has hypertension hyperlipidemia.   I saw the patient 05/27/16 with extreme dizziness and 19 pound weight loss since her last office visit. She was not eating or drinking. She had a fall that day. I stopped her Lasix and potassium and she was scheduled to follow-up with me next week.   I saw the patient 06/20/16 with 15 pound weight gain. Her dizziness had greatly improved but swelling had come back over a week's time. I restarted Lasix 20 mg once daily and potassium 10 mEq once daily. I also ordered compression stockings.  08/29/16 - 2 months follow up, the patient is feeling slightly better, her daughter states that she still has poor appetite and has to be reminded about eating and drinking. Her edema has improved with wearing compression stockings but still has residual mild edema. Her dizziness has improved significantly and is only occasional. She has gained 4 lbs since the last visit.   Past Medical History:  Diagnosis Date  . Allergic rhinitis, cause unspecified   . Anemia   . Arrhythmia atrial fib  . Atrial fibrillation ([place])    paroxymal  . Carcinoma of uterus [facility]) Feb. 2012   High-grade serous stage Ia/vaginal cuff recurrence July 2012  . Carotid artery occlusion    carotid endarterectomy     . COPD (chronic obstructive pulmonary disease) ([place])   . Coronary artery disease   .  Diverticulitis   . Hiatal hernia   . History of blood transfusion 08-26-13   1 unit given 08-26-13, 3 units given jan 2015  . History of home oxygen therapy    uses oxygen 2 liters per nasal cannula at hs  . Hyperlipidemia   . Osteoporosis   . Rectal bleeding 05-12-13   in hospital 4 days  . SOB (shortness of breath)    unexplained SOB onset fall 2010. PFT normal 06-12-09. Right heatrt Cath 06-26-09 nl PA but wedge 23 with non obst CAD, echo same day: Left ventricle: the cavity size was normal. Wall thickness was increased in a pattern of mild LVH. Systolic function was vigorous. Estimated ejection fraction 65% to 70%. Left atrium mildly dilated. nml walking sats 11-17-2009  . Stroke ([place])    Mini  . TIA (transient ischemic attack) yrs ago  . Unspecified essential hypertension   . Unspecified hypothyroidism     Past Surgical History:  Procedure Laterality Date  . ABDOMINAL HYSTERECTOMY  April 20,2012   by Dr. Rhodia Albright at  [place]  08/04/2009   Right  CEA  . CAROTID ENDARTERECTOMY    . CHOLECYSTECTOMY  yrs ago  . External Radiation  May 7,2013- October 07, 2011   25 treatments   . FLEXIBLE SIGMOIDOSCOPY N/A 09/02/2013   Procedure: FLEXIBLE SIGMOIDOSCOPY;  Surgeon: Jerene Bears, MD;  [facility];  Service: Gastroenterology;  Laterality: N/A;  . Internal Radiation  June  73,7106 and  October 24, 2011   5 dose by Dr. Belva Bertin  . TUBAL LIGATION  1970    Current Medications: Outpatient Medications Prior to Visit  Medication Sig Dispense Refill  . acetaminophen (TYLENOL) 500 MG tablet Take 500 mg by mouth every 6 (six) hours as needed for mild pain.    Marland Kitchen apixaban (ELIQUIS) 2.5 MG TABS tablet Take 1 tablet (2.5 mg total) by mouth 2 (two) times daily. 60 tablet 5  . bisoprolol (ZEBETA) 5 MG tablet Take 0.5 tablet by mouth daily    . Calcium Carb-Cholecalciferol (CALCIUM 600 + D) 600-200 MG-UNIT TABS Take 1 tablet by mouth 2 (two) times daily.    . iron  polysaccharides (FERREX 150) 150 MG capsule Take 150 mg by mouth 2 (two) times daily.    Marland Kitchen levothyroxine (SYNTHROID, LEVOTHROID) 50 MCG tablet Take 50 mcg by mouth daily before breakfast.    . LORazepam (ATIVAN) 0.5 MG tablet Take 1 tablet by mouth at bedtime as needed (for sleep).     . nitroGLYCERIN (NITROSTAT) 0.4 MG SL tablet Place 0.4 mg under the tongue every 5 (five) minutes as needed for chest pain.     Marland Kitchen oxybutynin (DITROPAN) 5 MG tablet Take 5 mg by mouth 2 (two) times daily.    . simvastatin (ZOCOR) 20 MG tablet Take 20 mg by mouth daily.    . Tiotropium Bromide Monohydrate (SPIRIVA RESPIMAT) 2.5 MCG/ACT AERS INHALE 2 PUFFS INTO THE LUNGS DAILY. 4 g 11  . diltiazem (CARDIZEM CD) 240 MG 24 hr capsule TAKE ONE CAPSULE BY MOUTH EVERY EVENING 30 capsule 11  . furosemide (LASIX) 20 MG tablet Take 1 tablet (20 mg total) by mouth daily. (Patient not taking: Reported on 08/28/2016) 90 tablet 3  . potassium chloride (K-DUR) 10 MEQ tablet Take 1 tablet (10 mEq total) by mouth daily. (Patient not taking: Reported on 08/28/2016) 90 tablet 3   No facility-administered medications prior to visit.      Allergies:   Albuterol   Social History   Social History  . Marital status: Married    Spouse name: N/A  . Number of children: 7  . Years of education: N/A   Occupational History  . retired from [employer]   .  Retired   Social History Main Topics  . Smoking status: Passive Smoke Exposure - Never Smoker  . Smokeless tobacco: Never Used  . Alcohol use No  . Drug use: No  . Sexual activity: No   Other Topics Concern  . None   Social History Narrative  . None     Family History:  The patient's family history includes Breast cancer (age of onset: 6) in her daughter; COPD in her sister; Cancer in her brother; Coronary artery disease in her other; Heart attack in her brother, daughter, and son; Heart disease in her brother, daughter, and mother; Heart disease (age of onset: 92) in her sister;  Hypertension in her brother, mother, and sister; Lung cancer in her sister; Prostate cancer in her son; Stroke in her mother.   ROS:   Please see the history of present illness.    Review of Systems  Constitution: Positive for weakness, malaise/fatigue and weight loss.  Cardiovascular: Positive for dyspnea on exertion.   All other systems reviewed and are negative.   PHYSICAL EXAM:   VS:  BP (!) 112/56   Pulse 86   Ht 5' (1.524 m)   Wt 114 lb (51.7 kg)   BMI 22.26 kg/m  Physical Exam  GEN: Thin, elderly, in no acute distress  Neck: no JVD, carotid bruits, or masses Cardiac:RRR; 2/6 systolic murmur at the left sternal border, no rubs, or gallops  Respiratory:  clear to auscultation bilaterally, normal work of breathing GI: soft, nontender, nondistended, + BS Ext: Compression stockings in place, mild ankle edema without cyanosis, clubbing,  Good distal pulses bilaterally Psych: euthymic mood, full affect  Wt Readings from Last 3 Encounters:  08/28/16 114 lb (51.7 kg)  07/01/16 111 lb 1.9 oz (50.4 kg)  06/20/16 115 lb 12.8 oz (52.5 kg)     Studies/Labs Reviewed:   EKG:  EKG is ordered today.  The ekg ordered today demonstrates Normal sinus rhythm, no acute change  Recent Labs: 08/28/2016: BUN 17; Creatinine, Ser 1.60; NT-Pro BNP 2,384; Potassium 4.5; Sodium 139   Lipid Panel    Component Value Date/Time   CHOL 143 03/05/2012 0918   TRIG 137.0 03/05/2012 0918   HDL 53.40 03/05/2012 0918   CHOLHDL 3 03/05/2012 0918   VLDL 27.4 03/05/2012 0918   LDLCALC 62 03/05/2012 0918    Additional studies/ records that were reviewed today include:   Myoview 2012 Impression Exercise Capacity:  Good exercise capacity. BP Response:  Normal blood pressure response. Clinical Symptoms:  No chest pain. ECG Impression:  No significant ST segment change suggestive of ischemia. Comparison with Prior Nuclear Study: No previous nuclear study performed   Overall Impression:  Normal stress  nuclear study.   Carotid Dopplers 12/21/13 less than 40% stenosis. No change from 2014.     ASSESSMENT:    1. Essential hypertension   2. Lower extremity edema   3. DIASTOLIC HEART FAILURE, CHRONIC   4. Paroxysmal atrial fibrillation (HCC)   5. Hypotension due to drugs      PLAN:  In order of problems listed above:  1. The patient has mild LE edema, BNP today 2300, I will increase lasix to 40 mg po daily. Crea stable at 1.6 Follow up with Estella Husk in 2 months.  2. Her blood pressure is still low for her age, I will decrease the dose of Cardizem to 120 mg daily.  3. Paroxysmal atrial fibrillation - none recently, no bleeding with Eliquis.  Medication Adjustments/Labs and Tests Ordered: Current medicines are reviewed at length with the patient today.  Concerns regarding medicines are outlined above.  Medication changes, Labs and Tests ordered today are listed in the Patient Instructions below. Patient Instructions  Medication Instructions:   DECREASE YOUR CARDIZEM CD TO 120 MG ONCE DAILY     Labwork:  TODAY--BMET AND PRO-BNP      Follow-Up:  3 MONTHS WITH DR Meda Coffee       If you need a refill on your cardiac medications before your next appointment, please call your pharmacy.      Signed, Ena Dawley, MD  08/29/2016 [facility] [place], [place], [place]  [address] Phone: [phone]; Fax: [phone]

## 2016-08-28 NOTE — Patient Instructions (Signed)
Medication Instructions:   DECREASE YOUR CARDIZEM CD TO 120 MG ONCE DAILY     Labwork:  TODAY--BMET AND PRO-BNP      Follow-Up:  3 MONTHS WITH DR Meda Coffee       If you need a refill on your cardiac medications before your next appointment, please call your pharmacy.

## 2016-08-29 ENCOUNTER — Telehealth: Payer: Self-pay | Admitting: *Deleted

## 2016-08-29 LAB — BASIC METABOLIC PANEL
BUN/Creatinine Ratio: 11 — ABNORMAL LOW (ref 12–28)
BUN: 17 mg/dL (ref 10–36)
CO2: 27 mmol/L (ref 18–29)
Calcium: 9.5 mg/dL (ref 8.7–10.3)
Chloride: 98 mmol/L (ref 96–106)
Creatinine, Ser: 1.6 mg/dL — ABNORMAL HIGH (ref 0.57–1.00)
GFR calc Af Amer: 32 mL/min/{1.73_m2} — ABNORMAL LOW (ref >59)
GFR calc non Af Amer: 28 mL/min/{1.73_m2} — ABNORMAL LOW (ref >59)
Glucose: 118 mg/dL — ABNORMAL HIGH (ref 65–99)
Potassium: 4.5 mmol/L (ref 3.5–5.2)
Sodium: 139 mmol/L (ref 134–144)

## 2016-08-29 LAB — PRO B NATRIURETIC PEPTIDE: NT-Pro BNP: 2384 pg/mL — ABNORMAL HIGH (ref 0–738)

## 2016-08-29 MED ORDER — FUROSEMIDE 40 MG PO TABS: 40.0000 mg | ORAL_TABLET | Freq: Every day | ORAL | 0 refills | Status: AC

## 2016-08-29 NOTE — Telephone Encounter (Signed)
-----   Message from Dorothy Spark, MD sent at 08/29/2016 11:24 AM EDT ----- BNP today 2300, I will increase lasix to 40 mg po daily. Crea stable at 1.6 Follow up with Estella Husk in 2 months.

## 2016-08-29 NOTE — Telephone Encounter (Signed)
Notified the pt and daughter of lab results and recommendations as mentioned below. Clarified with Dr Meda Coffee if it was ok for the pt to see her as scheduled in August.  Per Dr Meda Coffee, the pt should keep her appt with her as scheduled.   Daughter and pt both verbalized understanding and agrees with this plan.      BNP today 2300, I will increase lasix to 40 mg po daily. Crea stable at 1.6  Follow up with Dr Meda Coffee in August.

## 2016-09-11 ENCOUNTER — Encounter (HOSPITAL_BASED_OUTPATIENT_CLINIC_OR_DEPARTMENT_OTHER): Payer: Self-pay | Admitting: Emergency Medicine

## 2016-09-11 ENCOUNTER — Inpatient Hospital Stay (HOSPITAL_BASED_OUTPATIENT_CLINIC_OR_DEPARTMENT_OTHER)
Admission: EM | Admit: 2016-09-11 | Discharge: 2016-09-13 | DRG: 184 | Disposition: A | Discharge status: Home / Self Care | Payer: PPO | Attending: Internal Medicine | Admitting: Internal Medicine

## 2016-09-11 DIAGNOSIS — E039 Hypothyroidism, unspecified: Secondary | ICD-10-CM | POA: Diagnosis not present

## 2016-09-11 DIAGNOSIS — I130 Hypertensive heart and chronic kidney disease with heart failure and stage 1 through stage 4 chronic kidney disease, or unspecified chronic kidney disease: Secondary | ICD-10-CM | POA: Diagnosis not present

## 2016-09-11 DIAGNOSIS — R636 Underweight: Secondary | ICD-10-CM | POA: Diagnosis not present

## 2016-09-11 DIAGNOSIS — Z8673 Personal history of transient ischemic attack (TIA), and cerebral infarction without residual deficits: Secondary | ICD-10-CM | POA: Diagnosis not present

## 2016-09-11 DIAGNOSIS — Z7901 Long term (current) use of anticoagulants: Secondary | ICD-10-CM

## 2016-09-11 DIAGNOSIS — Z7982 Long term (current) use of aspirin: Secondary | ICD-10-CM | POA: Diagnosis not present

## 2016-09-11 DIAGNOSIS — S2231XA Fracture of one rib, right side, initial encounter for closed fracture: Secondary | ICD-10-CM | POA: Diagnosis present

## 2016-09-11 DIAGNOSIS — M810 Age-related osteoporosis without current pathological fracture: Secondary | ICD-10-CM | POA: Diagnosis present

## 2016-09-11 DIAGNOSIS — I2510 Atherosclerotic heart disease of native coronary artery without angina pectoris: Secondary | ICD-10-CM | POA: Diagnosis not present

## 2016-09-11 DIAGNOSIS — I5032 Chronic diastolic (congestive) heart failure: Secondary | ICD-10-CM | POA: Diagnosis not present

## 2016-09-11 DIAGNOSIS — Z90710 Acquired absence of both cervix and uterus: Secondary | ICD-10-CM

## 2016-09-11 DIAGNOSIS — R229 Localized swelling, mass and lump, unspecified: Secondary | ICD-10-CM

## 2016-09-11 DIAGNOSIS — W19XXXA Unspecified fall, initial encounter: Secondary | ICD-10-CM | POA: Diagnosis present

## 2016-09-11 DIAGNOSIS — Z79899 Other long term (current) drug therapy: Secondary | ICD-10-CM

## 2016-09-11 DIAGNOSIS — I4891 Unspecified atrial fibrillation: Secondary | ICD-10-CM | POA: Diagnosis present

## 2016-09-11 DIAGNOSIS — F0390 Unspecified dementia without behavioral disturbance: Secondary | ICD-10-CM | POA: Diagnosis not present

## 2016-09-11 DIAGNOSIS — I482 Chronic atrial fibrillation: Secondary | ICD-10-CM | POA: Diagnosis present

## 2016-09-11 DIAGNOSIS — J449 Chronic obstructive pulmonary disease, unspecified: Secondary | ICD-10-CM | POA: Diagnosis present

## 2016-09-11 DIAGNOSIS — Z8542 Personal history of malignant neoplasm of other parts of uterus: Secondary | ICD-10-CM

## 2016-09-11 DIAGNOSIS — R911 Solitary pulmonary nodule: Secondary | ICD-10-CM | POA: Diagnosis present

## 2016-09-11 DIAGNOSIS — C549 Malignant neoplasm of corpus uteri, unspecified: Secondary | ICD-10-CM | POA: Diagnosis present

## 2016-09-11 DIAGNOSIS — Z792 Long term (current) use of antibiotics: Secondary | ICD-10-CM | POA: Diagnosis not present

## 2016-09-11 DIAGNOSIS — J9611 Chronic respiratory failure with hypoxia: Secondary | ICD-10-CM | POA: Diagnosis present

## 2016-09-11 DIAGNOSIS — S2241XA Multiple fractures of ribs, right side, initial encounter for closed fracture: Secondary | ICD-10-CM | POA: Diagnosis not present

## 2016-09-11 DIAGNOSIS — W1830XA Fall on same level, unspecified, initial encounter: Secondary | ICD-10-CM | POA: Diagnosis not present

## 2016-09-11 DIAGNOSIS — Z681 Body mass index (BMI) 19 or less, adult: Secondary | ICD-10-CM | POA: Diagnosis not present

## 2016-09-11 DIAGNOSIS — N3000 Acute cystitis without hematuria: Secondary | ICD-10-CM | POA: Diagnosis not present

## 2016-09-11 DIAGNOSIS — N183 Chronic kidney disease, stage 3 (moderate): Secondary | ICD-10-CM

## 2016-09-11 DIAGNOSIS — IMO0002 Reserved for concepts with insufficient information to code with codable children: Secondary | ICD-10-CM

## 2016-09-11 DIAGNOSIS — N1832 Chronic kidney disease, stage 3b: Secondary | ICD-10-CM | POA: Diagnosis present

## 2016-09-11 NOTE — ED Triage Notes (Signed)
Pt fell about 8:30 this evening trying to walk from her walker to her bed.  Fell directly onto the carpeted floor.  Pt c/o right side rib pain and right hip pain.  Pt moving right leg without difficulty in triage, crossing right knee over her left.

## 2016-09-12 ENCOUNTER — Telehealth: Payer: Self-pay | Admitting: Cardiology

## 2016-09-12 ENCOUNTER — Encounter (HOSPITAL_BASED_OUTPATIENT_CLINIC_OR_DEPARTMENT_OTHER): Payer: Self-pay | Admitting: *Deleted

## 2016-09-12 ENCOUNTER — Inpatient Hospital Stay (HOSPITAL_COMMUNITY): Payer: PPO

## 2016-09-12 ENCOUNTER — Emergency Department (HOSPITAL_BASED_OUTPATIENT_CLINIC_OR_DEPARTMENT_OTHER): Payer: PPO

## 2016-09-12 DIAGNOSIS — Z90710 Acquired absence of both cervix and uterus: Secondary | ICD-10-CM | POA: Diagnosis not present

## 2016-09-12 DIAGNOSIS — J449 Chronic obstructive pulmonary disease, unspecified: Secondary | ICD-10-CM | POA: Diagnosis not present

## 2016-09-12 DIAGNOSIS — M25551 Pain in right hip: Secondary | ICD-10-CM | POA: Diagnosis not present

## 2016-09-12 DIAGNOSIS — W19XXXD Unspecified fall, subsequent encounter: Secondary | ICD-10-CM | POA: Diagnosis not present

## 2016-09-12 DIAGNOSIS — S2241XA Multiple fractures of ribs, right side, initial encounter for closed fracture: Secondary | ICD-10-CM | POA: Diagnosis not present

## 2016-09-12 DIAGNOSIS — S2231XA Fracture of one rib, right side, initial encounter for closed fracture: Secondary | ICD-10-CM | POA: Diagnosis present

## 2016-09-12 DIAGNOSIS — I480 Paroxysmal atrial fibrillation: Secondary | ICD-10-CM | POA: Diagnosis not present

## 2016-09-12 DIAGNOSIS — I2510 Atherosclerotic heart disease of native coronary artery without angina pectoris: Secondary | ICD-10-CM | POA: Diagnosis not present

## 2016-09-12 DIAGNOSIS — Z681 Body mass index (BMI) 19 or less, adult: Secondary | ICD-10-CM | POA: Diagnosis not present

## 2016-09-12 DIAGNOSIS — R636 Underweight: Secondary | ICD-10-CM | POA: Diagnosis not present

## 2016-09-12 DIAGNOSIS — J9611 Chronic respiratory failure with hypoxia: Secondary | ICD-10-CM | POA: Diagnosis not present

## 2016-09-12 DIAGNOSIS — N1832 Chronic kidney disease, stage 3b: Secondary | ICD-10-CM | POA: Diagnosis present

## 2016-09-12 DIAGNOSIS — Z7901 Long term (current) use of anticoagulants: Secondary | ICD-10-CM | POA: Diagnosis not present

## 2016-09-12 DIAGNOSIS — I482 Chronic atrial fibrillation: Secondary | ICD-10-CM | POA: Diagnosis not present

## 2016-09-12 DIAGNOSIS — W19XXXA Unspecified fall, initial encounter: Secondary | ICD-10-CM | POA: Diagnosis not present

## 2016-09-12 DIAGNOSIS — N3000 Acute cystitis without hematuria: Secondary | ICD-10-CM | POA: Diagnosis not present

## 2016-09-12 DIAGNOSIS — S79911A Unspecified injury of right hip, initial encounter: Secondary | ICD-10-CM | POA: Diagnosis not present

## 2016-09-12 DIAGNOSIS — Z792 Long term (current) use of antibiotics: Secondary | ICD-10-CM | POA: Diagnosis not present

## 2016-09-12 DIAGNOSIS — R1902 Left upper quadrant abdominal swelling, mass and lump: Secondary | ICD-10-CM | POA: Diagnosis not present

## 2016-09-12 DIAGNOSIS — F0390 Unspecified dementia without behavioral disturbance: Secondary | ICD-10-CM | POA: Diagnosis not present

## 2016-09-12 DIAGNOSIS — Z79899 Other long term (current) drug therapy: Secondary | ICD-10-CM | POA: Diagnosis not present

## 2016-09-12 DIAGNOSIS — Z7982 Long term (current) use of aspirin: Secondary | ICD-10-CM | POA: Diagnosis not present

## 2016-09-12 DIAGNOSIS — Z8673 Personal history of transient ischemic attack (TIA), and cerebral infarction without residual deficits: Secondary | ICD-10-CM | POA: Diagnosis not present

## 2016-09-12 DIAGNOSIS — W1830XA Fall on same level, unspecified, initial encounter: Secondary | ICD-10-CM | POA: Diagnosis not present

## 2016-09-12 DIAGNOSIS — Z8542 Personal history of malignant neoplasm of other parts of uterus: Secondary | ICD-10-CM | POA: Diagnosis not present

## 2016-09-12 DIAGNOSIS — M810 Age-related osteoporosis without current pathological fracture: Secondary | ICD-10-CM | POA: Diagnosis not present

## 2016-09-12 DIAGNOSIS — I5032 Chronic diastolic (congestive) heart failure: Secondary | ICD-10-CM | POA: Diagnosis not present

## 2016-09-12 DIAGNOSIS — N183 Chronic kidney disease, stage 3 (moderate): Secondary | ICD-10-CM

## 2016-09-12 DIAGNOSIS — I130 Hypertensive heart and chronic kidney disease with heart failure and stage 1 through stage 4 chronic kidney disease, or unspecified chronic kidney disease: Secondary | ICD-10-CM | POA: Diagnosis not present

## 2016-09-12 DIAGNOSIS — E039 Hypothyroidism, unspecified: Secondary | ICD-10-CM | POA: Diagnosis not present

## 2016-09-12 LAB — COMPREHENSIVE METABOLIC PANEL
ALT: 12 U/L — AB (ref 14–54)
AST: 26 U/L (ref 15–41)
Albumin: 3.2 g/dL — ABNORMAL LOW (ref 3.5–5.0)
Alkaline Phosphatase: 103 U/L (ref 38–126)
Anion gap: 12 (ref 5–15)
BUN: 27 mg/dL — ABNORMAL HIGH (ref 6–20)
CHLORIDE: 97 mmol/L — AB (ref 101–111)
CO2: 30 mmol/L (ref 22–32)
CREATININE: 1.51 mg/dL — AB (ref 0.44–1.00)
Calcium: 10.5 mg/dL — ABNORMAL HIGH (ref 8.9–10.3)
GFR calc non Af Amer: 29 mL/min — ABNORMAL LOW (ref >60)
GFR, EST AFRICAN AMERICAN: 34 mL/min — AB (ref >60)
Glucose, Bld: 115 mg/dL — ABNORMAL HIGH (ref 65–99)
Potassium: 3.4 mmol/L — ABNORMAL LOW (ref 3.5–5.1)
Sodium: 139 mmol/L (ref 135–145)
Total Bilirubin: 0.5 mg/dL (ref 0.3–1.2)
Total Protein: 6.3 g/dL — ABNORMAL LOW (ref 6.5–8.1)

## 2016-09-12 LAB — CBC WITH DIFFERENTIAL/PLATELET
BASOS ABS: 0 10*3/uL (ref 0.0–0.1)
Basophils Relative: 0 %
Eosinophils Absolute: 0 10*3/uL (ref 0.0–0.7)
Eosinophils Relative: 0 %
HCT: 33.5 % — ABNORMAL LOW (ref 36.0–46.0)
Hemoglobin: 10.2 g/dL — ABNORMAL LOW (ref 12.0–15.0)
LYMPHS ABS: 1 10*3/uL (ref 0.7–4.0)
Lymphocytes Relative: 8 %
MCH: 26.2 pg (ref 26.0–34.0)
MCHC: 30.4 g/dL (ref 30.0–36.0)
MCV: 86.1 fL (ref 78.0–100.0)
Monocytes Absolute: 1.1 10*3/uL — ABNORMAL HIGH (ref 0.1–1.0)
Monocytes Relative: 9 %
Neutro Abs: 9.6 10*3/uL — ABNORMAL HIGH (ref 1.7–7.7)
Neutrophils Relative %: 83 %
Platelets: 261 10*3/uL (ref 150–400)
RBC: 3.89 MIL/uL (ref 3.87–5.11)
RDW: 14.1 % (ref 11.5–15.5)
WBC: 11.7 10*3/uL — AB (ref 4.0–10.5)

## 2016-09-12 LAB — URINALYSIS, MICROSCOPIC (REFLEX)

## 2016-09-12 LAB — URINALYSIS, ROUTINE W REFLEX MICROSCOPIC
BILIRUBIN URINE: NEGATIVE
Glucose, UA: NEGATIVE mg/dL
KETONES UR: NEGATIVE mg/dL
Nitrite: POSITIVE — AB
PROTEIN: NEGATIVE mg/dL
Specific Gravity, Urine: 1.011 (ref 1.005–1.030)
pH: 7.5 (ref 5.0–8.0)

## 2016-09-12 MED ORDER — BISOPROLOL FUMARATE 5 MG PO TABS
2.5000 mg | ORAL_TABLET | Freq: Every day | ORAL | Status: DC
  Administered 2016-09-12 – 2016-09-13 (×2): 2.5 mg via ORAL
  Filled 2016-09-12 (×2): qty 1

## 2016-09-12 MED ORDER — POLYETHYLENE GLYCOL 3350 17 G PO PACK
17.0000 g | PACK | Freq: Every day | ORAL | Status: DC | PRN
  Administered 2016-09-12: 17 g via ORAL
  Filled 2016-09-12: qty 1

## 2016-09-12 MED ORDER — ACETAMINOPHEN 500 MG PO TABS
1000.0000 mg | ORAL_TABLET | Freq: Three times a day (TID) | ORAL | Status: DC
  Administered 2016-09-12 (×2): 1000 mg via ORAL
  Filled 2016-09-12 (×3): qty 2

## 2016-09-12 MED ORDER — DILTIAZEM HCL ER COATED BEADS 120 MG PO CP24
120.0000 mg | ORAL_CAPSULE | Freq: Every day | ORAL | Status: DC
  Administered 2016-09-12 – 2016-09-13 (×2): 120 mg via ORAL
  Filled 2016-09-12 (×2): qty 1

## 2016-09-12 MED ORDER — APIXABAN 2.5 MG PO TABS
2.5000 mg | ORAL_TABLET | Freq: Two times a day (BID) | ORAL | Status: DC
  Administered 2016-09-12 – 2016-09-13 (×3): 2.5 mg via ORAL
  Filled 2016-09-12 (×3): qty 1

## 2016-09-12 MED ORDER — LEVOTHYROXINE SODIUM 50 MCG PO TABS
75.0000 ug | ORAL_TABLET | Freq: Every day | ORAL | Status: DC
  Administered 2016-09-12 – 2016-09-13 (×2): 75 ug via ORAL
  Filled 2016-09-12: qty 1

## 2016-09-12 MED ORDER — ATORVASTATIN CALCIUM 20 MG PO TABS
20.0000 mg | ORAL_TABLET | Freq: Every day | ORAL | Status: DC
  Administered 2016-09-12: 20 mg via ORAL
  Filled 2016-09-12: qty 1

## 2016-09-12 MED ORDER — CALCIUM CARBONATE-VITAMIN D 500-200 MG-UNIT PO TABS
1.0000 | ORAL_TABLET | Freq: Two times a day (BID) | ORAL | Status: DC
  Administered 2016-09-12 – 2016-09-13 (×3): 1 via ORAL
  Filled 2016-09-12 (×3): qty 1

## 2016-09-12 MED ORDER — POTASSIUM CHLORIDE CRYS ER 10 MEQ PO TBCR
10.0000 meq | EXTENDED_RELEASE_TABLET | ORAL | Status: DC
  Administered 2016-09-12 – 2016-09-13 (×2): 10 meq via ORAL
  Filled 2016-09-12 (×2): qty 1

## 2016-09-12 MED ORDER — IOPAMIDOL (ISOVUE-300) INJECTION 61%: 30.0000 mL | Freq: Once | INTRAVENOUS | Status: DC | PRN

## 2016-09-12 MED ORDER — FUROSEMIDE 40 MG PO TABS
40.0000 mg | ORAL_TABLET | Freq: Every day | ORAL | Status: DC
  Administered 2016-09-12 – 2016-09-13 (×2): 40 mg via ORAL
  Filled 2016-09-12 (×2): qty 1

## 2016-09-12 MED ORDER — DEXTROSE 5 % IV SOLN
1.0000 g | Freq: Once | INTRAVENOUS | Status: AC
  Administered 2016-09-12: 1 g via INTRAVENOUS
  Filled 2016-09-12: qty 10

## 2016-09-12 MED ORDER — LORAZEPAM 0.5 MG PO TABS: 0.5000 mg | ORAL_TABLET | Freq: Every evening | ORAL | Status: DC | PRN

## 2016-09-12 MED ORDER — POLYSACCHARIDE IRON COMPLEX 150 MG PO CAPS
150.0000 mg | ORAL_CAPSULE | Freq: Two times a day (BID) | ORAL | Status: DC
  Administered 2016-09-12 – 2016-09-13 (×3): 150 mg via ORAL
  Filled 2016-09-12 (×3): qty 1

## 2016-09-12 MED ORDER — ONDANSETRON HCL 4 MG/2ML IJ SOLN: 4.0000 mg | Freq: Four times a day (QID) | INTRAMUSCULAR | Status: DC | PRN

## 2016-09-12 MED ORDER — ONDANSETRON HCL 4 MG/2ML IJ SOLN
4.0000 mg | Freq: Once | INTRAMUSCULAR | Status: AC
  Administered 2016-09-12: 4 mg via INTRAVENOUS
  Filled 2016-09-12: qty 2

## 2016-09-12 MED ORDER — IOPAMIDOL (ISOVUE-300) INJECTION 61%
INTRAVENOUS | Status: AC
  Administered 2016-09-12: 30 mL
  Filled 2016-09-12: qty 30

## 2016-09-12 MED ORDER — ONDANSETRON HCL 4 MG PO TABS: 4.0000 mg | ORAL_TABLET | Freq: Four times a day (QID) | ORAL | Status: DC | PRN

## 2016-09-12 MED ORDER — FENTANYL CITRATE (PF) 100 MCG/2ML IJ SOLN
50.0000 ug | Freq: Once | INTRAMUSCULAR | Status: AC
  Administered 2016-09-12: 50 ug via INTRAVENOUS

## 2016-09-12 MED ORDER — CALCIUM CARB-CHOLECALCIFEROL 600-200 MG-UNIT PO TABS: 1.0000 | ORAL_TABLET | Freq: Two times a day (BID) | ORAL | Status: DC

## 2016-09-12 MED ORDER — TIOTROPIUM BROMIDE MONOHYDRATE 18 MCG IN CAPS
1.0000 | ORAL_CAPSULE | Freq: Every day | RESPIRATORY_TRACT | Status: DC
  Filled 2016-09-12: qty 5

## 2016-09-12 MED ORDER — OXYBUTYNIN CHLORIDE 5 MG PO TABS
5.0000 mg | ORAL_TABLET | Freq: Two times a day (BID) | ORAL | Status: DC
  Administered 2016-09-12 – 2016-09-13 (×3): 5 mg via ORAL
  Filled 2016-09-12 (×3): qty 1

## 2016-09-12 MED ORDER — TRAMADOL HCL 50 MG PO TABS
50.0000 mg | ORAL_TABLET | Freq: Four times a day (QID) | ORAL | Status: DC | PRN
  Administered 2016-09-12: 50 mg via ORAL
  Filled 2016-09-12: qty 1

## 2016-09-12 MED ORDER — NITROGLYCERIN 0.4 MG SL SUBL: 0.4000 mg | SUBLINGUAL_TABLET | SUBLINGUAL | Status: DC | PRN

## 2016-09-12 MED ORDER — ACETAMINOPHEN 500 MG PO TABS: 500.0000 mg | ORAL_TABLET | Freq: Four times a day (QID) | ORAL | Status: DC | PRN

## 2016-09-12 MED ORDER — FENTANYL CITRATE (PF) 100 MCG/2ML IJ SOLN
INTRAMUSCULAR | Status: AC
  Filled 2016-09-12: qty 2

## 2016-09-12 MED ORDER — SIMVASTATIN 40 MG PO TABS
40.0000 mg | ORAL_TABLET | Freq: Every day | ORAL | Status: DC
Start: 2016-09-12 — End: 2016-09-12

## 2016-09-12 MED ORDER — LEVALBUTEROL HCL 0.63 MG/3ML IN NEBU: 0.6300 mg | INHALATION_SOLUTION | Freq: Four times a day (QID) | RESPIRATORY_TRACT | Status: DC | PRN

## 2016-09-12 MED ORDER — SODIUM CHLORIDE 0.9% FLUSH
3.0000 mL | Freq: Two times a day (BID) | INTRAVENOUS | Status: DC
  Administered 2016-09-12: 3 mL via INTRAVENOUS

## 2016-09-12 MED ORDER — BOOST / RESOURCE BREEZE PO LIQD
1.0000 | ORAL | Status: DC
  Administered 2016-09-12: 1 via ORAL

## 2016-09-12 MED ORDER — DEXTROSE 5 % IV SOLN
1.0000 g | INTRAVENOUS | Status: DC
  Administered 2016-09-13: 1 g via INTRAVENOUS
  Filled 2016-09-12: qty 10

## 2016-09-12 MED ORDER — SODIUM CHLORIDE 0.9 % IV SOLN
Freq: Once | INTRAVENOUS | Status: AC
  Administered 2016-09-12: 04:00:00 via INTRAVENOUS

## 2016-09-12 MED ORDER — MORPHINE SULFATE (PF) 4 MG/ML IV SOLN: 1.0000 mg | INTRAVENOUS | Status: DC | PRN

## 2016-09-12 MED ORDER — ENSURE ENLIVE PO LIQD
237.0000 mL | ORAL | Status: DC
  Administered 2016-09-13: 237 mL via ORAL

## 2016-09-12 NOTE — Telephone Encounter (Signed)
Informed the pts daughter of Dr Francesca Oman recommendations and condolences to her, her family and the pt.  Daughter verbalized understanding and very gracious for all the assistance and care provided.

## 2016-09-12 NOTE — Progress Notes (Signed)
Initial Nutrition Assessment  DOCUMENTATION CODES:   Underweight  INTERVENTION:  - Will order Ensure Enlive once/day, this supplement provides 350 kcal and 20 grams of protein - Will order Boost Breeze once/day, this supplement provides 250 kcal and 9 grams of protein - Continue to encourage PO intakes of meals, supplements, and beverages. - RD will continue to monitor for nutrition-related needs.  NUTRITION DIAGNOSIS:   Underweight related to poor appetite as evidenced by per patient/family report, other (see comment) (BMI of 17.9 kg/m2).  GOAL:   Patient will meet greater than or equal to 90% of their needs  MONITOR:   PO intake, Supplement acceptance, Weight trends, Labs  REASON FOR ASSESSMENT:   Malnutrition Screening Tool   ASSESSMENT:   83 y.o. female with medical history significant of atrial fibrillation on anticoagulation, chronic diastolic heart failure, prior history of endometrial cancer status post hysterectomy and radiation treatment presented to [facility] [place] [facility] after sustaining a mechanical fall. Patient is a poor historian, she is hard of hearing and probably has underlying dementia. Apparently patient has had frequent falls in the past few months (at least 3-4), since last week, the patient has started to use a walker. She started to complain of right-sided chest pain following the mechanical fall, and hence was taken to [place] where further imaging demonstrated right-sided rib fractures. She was also found to have a UTI.  Pt seen for MST. BMI indicates underweight status. No intakes documented since admission. Spoke with pt's daughter and daughter's husband (pt's son-in-law) who requested to have conversation outside of pt's room rather than at bedside. They report pt is very HOH. PTA pt was becoming increasingly weak which had been a gradual decline and she got a walker 1 week PTA but did not always use it when needed. Pt's other daughter, who is  currently not present, often prepares meals for pt and encourages her to eat. The family has also hired someone who they describe as a Technical brewer and state that this individual is not trained as a caregiver but does stay with pt and her husband and also fixes them meals. Family states that pt only eats "what she has to" when highly encouraged and that she does not have an appetite. PCP had encouraged pt to drink plenty of water but family states pt does not like water and was not following this PTA.   Unable to perform physical assessment at this time, per family request. Per review, pt has lost 14 lbs (12.6% body weight) in the past 2 months which is significant for time frame. Pt likely does qualify for severe malnutrition in the context of acute illness but unable to confirm without physical assessment or knowing for certainty how much she was eating PTA.  Medications reviewed; 1 tablet Oscal with D BID, 40 mg oral Lasix/day, 75 mcg oral Synthroid/day, 10 mEq oral KCl/day. Labs reviewed; K: 3.4 mmol/L, Cl: 97 mmol/L, BUN: 27 mg/dL, creatinine: 1.51 mg/dL, Ca: 10.5 mg/dL, GFR: 29 mL/min.  IVF: NS @ 125 mL/hr.   Diet Order:  Diet Heart Room service appropriate? Yes; Fluid consistency: Thin  Skin:  Reviewed, no issues  Last BM:  5/16  Height:   Ht Readings from Last 1 Encounters:  09/12/16 5\' 2"  (1.575 m)    Weight:   Wt Readings from Last 1 Encounters:  09/12/16 97 lb 10.6 oz (44.3 kg)    Ideal Body Weight:  50 kg  BMI:  Body mass index is 17.86  kg/m.  Estimated Nutritional Needs:   Kcal:  1330-1550 (30-35 kcal/kg)  Protein:  53-62 grams (1.2-1.4 grams/kg)  Fluid:  >/= 1.5 L/day  EDUCATION NEEDS:   No education needs identified at this time    Jarome Matin, MS, RD, LDN, CNSC Inpatient Clinical Dietitian Pager # [phone] After hours/weekend pager # [phone]

## 2016-09-12 NOTE — Telephone Encounter (Signed)
Please tell her dad switching to aspirin only would be most appropriate at that really sorry for all the bad news and that I am really sorry for what the patient has been going through. [name]

## 2016-09-12 NOTE — ED Provider Notes (Signed)
[facility] DEPT [facility] Provider Note: Linda Spurling, MD, FACEP  CSN: [id number] MRN: [id number] ARRIVAL: 09/11/16 at [place]: [place] is a 83 y.o. female who lost her balance and fell about 8:30 yesterday evening. She struck her right lower chest on her carpeted floor. She is having pain in her right lower rib area. Pain is worse with movement or palpation. Pain is minimal at rest but she rates it a 7 out of 10 at its worst with movement. She was earlier having pain in her right hip that has improved. She denies neck pain.   Past Medical History:  Diagnosis Date  . Allergic rhinitis, cause unspecified   . Anemia   . Arrhythmia atrial fib  . Atrial fibrillation ([place])    paroxymal  . Carcinoma of uterus [facility]) Feb. 2012   High-grade serous stage Ia/vaginal cuff recurrence July 2012  . Carotid artery occlusion    carotid endarterectomy     . COPD (chronic obstructive pulmonary disease) ([place])   . Coronary artery disease   . Diverticulitis   . Hiatal hernia   . History of blood transfusion 08-26-13   1 unit given 08-26-13, 3 units given jan 2015  . History of home oxygen therapy    uses oxygen 2 liters per nasal cannula at hs  . Hyperlipidemia   . Osteoporosis   . Rectal bleeding 05-12-13   in hospital 4 days  . SOB (shortness of breath)    unexplained SOB onset fall 2010. PFT normal 06-12-09. Right heatrt Cath 06-26-09 nl PA but wedge 23 with non obst CAD, echo same day: Left ventricle: the cavity size was normal. Wall thickness was increased in a pattern of mild LVH. Systolic function was vigorous. Estimated ejection fraction 65% to 70%. Left atrium mildly dilated. nml walking sats 11-17-2009  . Stroke ([place])    Mini  . TIA (transient ischemic attack) yrs ago  . Unspecified essential hypertension   . Unspecified hypothyroidism     Past Surgical History:  Procedure Laterality Date  . ABDOMINAL  HYSTERECTOMY  April 20,2012   by Dr. Rhodia Albright at  [place]  08/04/2009   Right  CEA  . CAROTID ENDARTERECTOMY    . CHOLECYSTECTOMY  yrs ago  . External Radiation  May 7,2013- October 07, 2011   25 treatments   . FLEXIBLE SIGMOIDOSCOPY N/A 09/02/2013   Procedure: FLEXIBLE SIGMOIDOSCOPY;  Surgeon: Jerene Bears, MD;  Location: [facility] ENDOSCOPY;  Service: Gastroenterology;  Laterality: N/A;  . Internal Radiation  June 13,2013 and  October 24, 2011   5 dose by Dr. Belva Bertin  . TUBAL LIGATION  1970    Family History  Problem Relation Age of Onset  . Heart disease Mother   . Hypertension Mother   . Stroke Mother   . Heart disease Sister 64       Heart Disease before age 67  . Lung cancer Sister   . COPD Sister   . Hypertension Sister   . Heart disease Brother        Before age 35  . Cancer Brother   . Hypertension Brother   . Heart attack Brother   . Coronary artery disease Other   . Breast cancer Daughter 66  . Heart attack Daughter   . Heart disease Daughter        Before age 87  . Prostate cancer  Son   . Heart attack Son     Social History  Substance Use Topics  . Smoking status: Passive Smoke Exposure - Never Smoker  . Smokeless tobacco: Never Used  . Alcohol use No    Prior to Admission medications   Medication Sig Start Date End Date Taking? Authorizing Provider  apixaban (ELIQUIS) 2.5 MG TABS tablet Take 1 tablet (2.5 mg total) by mouth 2 (two) times daily. 06/25/16  Yes Dorothy Spark, MD  bisoprolol (ZEBETA) 5 MG tablet Take 0.5 tablet by mouth daily   Yes [provider]  Calcium Carb-Cholecalciferol (CALCIUM 600 + D) 600-200 MG-UNIT TABS Take 1 tablet by mouth 2 (two) times daily.   Yes [provider]  diltiazem (CARDIZEM CD) 120 MG 24 hr capsule Take 1 capsule (120 mg total) by mouth daily. 08/28/16 11/26/16 Yes Dorothy Spark, MD  furosemide (LASIX) 40 MG tablet Take 1 tablet (40 mg total) by mouth daily. 08/29/16 11/27/16 Yes  Dorothy Spark, MD  iron polysaccharides (FERREX 150) 150 MG capsule Take 150 mg by mouth 2 (two) times daily.   Yes [provider]  KLOR-CON 10 10 MEQ tablet Take 10 mEq by mouth every other day. 06/20/16  Yes [provider]  levothyroxine (SYNTHROID, LEVOTHROID) 50 MCG tablet Take 50 mcg by mouth daily before breakfast.   Yes [provider]  LORazepam (ATIVAN) 0.5 MG tablet Take 1 tablet by mouth at bedtime as needed (for sleep).    Yes [provider]  Melatonin 10 MG TABS Take by mouth.   Yes [provider]  oxybutynin (DITROPAN) 5 MG tablet Take 5 mg by mouth 2 (two) times daily.   Yes [provider]  simvastatin (ZOCOR) 20 MG tablet Take 20 mg by mouth daily.   Yes [provider]  acetaminophen (TYLENOL) 500 MG tablet Take 500 mg by mouth every 6 (six) hours as needed for mild pain.    [provider]  nitroGLYCERIN (NITROSTAT) 0.4 MG SL tablet Place 0.4 mg under the tongue every 5 (five) minutes as needed for chest pain.     [provider]  Tiotropium Bromide Monohydrate (SPIRIVA RESPIMAT) 2.5 MCG/ACT AERS INHALE 2 PUFFS INTO THE LUNGS DAILY. 06/02/15   Brand Males, MD    Allergies Albuterol   REVIEW OF SYSTEMS  Negative except as noted here or in the History of Present Illness.   PHYSICAL EXAMINATION  Initial Vital Signs Blood pressure 100/71, pulse 77, temperature 98.2 F (36.8 C), temperature source Oral, resp. rate 17, SpO2 99 %.  Examination General: Well-developed, well-nourished female in no acute distress; appearance consistent with age of record HENT: normocephalic; atraumatic Eyes: pupils equal, round and reactive to light; extraocular muscles intact Neck: supple; no C-spine tenderness Heart: regular rate and rhythm Lungs: clear to auscultation bilaterally Chest: Right lower lateral rib point tenderness Abdomen: soft; nondistended; mild right lower quadrant tenderness; no  masses or hepatosplenomegaly; bowel sounds present Extremities: Arthritic changes; normal range of motion for age; no pain on passive range of motion Neurologic: Awake, alert; motor function intact in all extremities and symmetric; no facial droop Skin: Warm and dry Psychiatric: Flat affect   RESULTS  Summary of this visit's results, reviewed by myself:   EKG Interpretation  Date/Time:    Ventricular Rate:    PR Interval:    QRS Duration:   QT Interval:    QTC Calculation:   R Axis:     Text Interpretation:  Laboratory Studies: Results for orders placed or performed during the hospital encounter of 09/11/16 (from the past 24 hour(s))  CBC with Differential     Status: Abnormal   Collection Time: 09/12/16  1:23 AM  Result Value Ref Range   WBC 11.7 (H) 4.0 - 10.5 K/uL   RBC 3.89 3.87 - 5.11 MIL/uL   Hemoglobin 10.2 (L) 12.0 - 15.0 g/dL   HCT 33.5 (L) 36.0 - 46.0 %   MCV 86.1 78.0 - 100.0 fL   MCH 26.2 26.0 - 34.0 pg   MCHC 30.4 30.0 - 36.0 g/dL   RDW 14.1 11.5 - 15.5 %   Platelets 261 150 - 400 K/uL   Neutrophils Relative % 83 %   Neutro Abs 9.6 (H) 1.7 - 7.7 K/uL   Lymphocytes Relative 8 %   Lymphs Abs 1.0 0.7 - 4.0 K/uL   Monocytes Relative 9 %   Monocytes Absolute 1.1 (H) 0.1 - 1.0 K/uL   Eosinophils Relative 0 %   Eosinophils Absolute 0.0 0.0 - 0.7 K/uL   Basophils Relative 0 %   Basophils Absolute 0.0 0.0 - 0.1 K/uL  Comprehensive metabolic panel     Status: Abnormal   Collection Time: 09/12/16  1:23 AM  Result Value Ref Range   Sodium 139 135 - 145 mmol/L   Potassium 3.4 (L) 3.5 - 5.1 mmol/L   Chloride 97 (L) 101 - 111 mmol/L   CO2 30 22 - 32 mmol/L   Glucose, Bld 115 (H) 65 - 99 mg/dL   BUN 27 (H) 6 - 20 mg/dL   Creatinine, Ser 1.51 (H) 0.44 - 1.00 mg/dL   Calcium 10.5 (H) 8.9 - 10.3 mg/dL   Total Protein 6.3 (L) 6.5 - 8.1 g/dL   Albumin 3.2 (L) 3.5 - 5.0 g/dL   AST 26 15 - 41 U/L   ALT 12 (L) 14 - 54 U/L   Alkaline Phosphatase 103 38 - 126  U/L   Total Bilirubin 0.5 0.3 - 1.2 mg/dL   GFR calc non Af Amer 29 (L) >60 mL/min   GFR calc Af Amer 34 (L) >60 mL/min   Anion gap 12 5 - 15  Urinalysis, Routine w reflex microscopic     Status: Abnormal   Collection Time: 09/12/16  2:25 AM  Result Value Ref Range   Color, Urine YELLOW YELLOW   APPearance CLOUDY (A) CLEAR   Specific Gravity, Urine 1.011 1.005 - 1.030   pH 7.5 5.0 - 8.0   Glucose, UA NEGATIVE NEGATIVE mg/dL   Hgb urine dipstick TRACE (A) NEGATIVE   Bilirubin Urine NEGATIVE NEGATIVE   Ketones, ur NEGATIVE NEGATIVE mg/dL   Protein, ur NEGATIVE NEGATIVE mg/dL   Nitrite POSITIVE (A) NEGATIVE   Leukocytes, UA LARGE (A) NEGATIVE  Urinalysis, Microscopic (reflex)     Status: Abnormal   Collection Time: 09/12/16  2:25 AM  Result Value Ref Range   RBC / HPF 0-5 0 - 5 RBC/hpf   WBC, UA TOO NUMEROUS TO COUNT 0 - 5 WBC/hpf   Bacteria, UA MANY (A) NONE SEEN   Squamous Epithelial / LPF 0-5 (A) NONE SEEN   WBC Clumps PRESENT    Imaging Studies: Dg Ribs Unilateral W/chest Right  Result Date: 09/12/2016 CLINICAL DATA:  Patient fell 4 hours ago with anterior lower right rib pain. EXAM: RIGHT RIBS AND CHEST - 3+ VIEW COMPARISON:  12/22/2014 FINDINGS: An acute minimally displaced right lateral eighth rib fracture is noted without pneumothorax. Adjacent pulmonary parenchymal opacity  however is seen which may represent a pulmonary contusion. Cannot exclude a small hemothorax contributing to the opacity. No blunting of the costophrenic angles. There is borderline cardiomegaly with aortic atherosclerosis. There is bronchiectasis to both lower lobes. Upper lobe hyperinflation is seen. There is subsegmental atelectasis and/or scarring at the left lung base. IMPRESSION: 1. Acute right lateral minimally displaced eighth rib fracture with associated pulmonary parenchymal opacity which may represent a pulmonary contusion. An adjacent small hemothorax is not entirely excluded. Follow-up radiographs  to assure stability is suggested. No pneumothorax is noted. 2. Aortic atherosclerosis with stable cardiomegaly. Electronically Signed   By: Ashley Royalty M.D.   On: 09/12/2016 00:53   Ct Chest Wo Contrast  Result Date: 09/12/2016 CLINICAL DATA:  Rib pain after fall EXAM: CT CHEST WITHOUT CONTRAST TECHNIQUE: Multidetector CT imaging of the chest was performed following the standard protocol without IV contrast. COMPARISON:  CT from 01/21/2014 of the chest and CT abdomen from 05/17/2010 FINDINGS: Cardiovascular: The heart is normal in size. There is minimal pericardial thickening anteriorly. Mitral annular calcifications are present with 3 vessel coronary arteriosclerosis and aortic atherosclerosis. No aortic aneurysm is identified with maximum caliber of 3.4 cm noted along the ascending aorta. Normal 3 vessel takeoff from the aortic arch with atherosclerosis at the origins of the right brachiocephalic and left subclavian arteries. Mediastinum/Nodes: The thyroid gland is normal in size. No axillary or supraclavicular adenopathy. Stable 9 mm short axis right lower paratracheal lymph node ridges 8 mm previously. Smaller lymph nodes are present within the mediastinum. Mild dilatation of the main pulmonary artery to 3 cm consistent with pulmonary hypertension. Lungs/Pleura: Biapical pleuroparenchymal scarring is seen. Calcified pleural plaque is noted bilaterally consistent with prior asbestos exposure. There are more focal areas of pleural thickening along the periphery of the right upper thorax, one are in particular encompassing the anterolateral fifth rib. A stable 6 mm pulmonary nodule is noted in the right lower lobe unchanged in appearance. Emphysematous disease in both lower lobes with bronchiectasis is identified similar to prior. Upper Abdomen: There is new splenomegaly with the spleen measuring up to 14.4 cm in AP dimension. Adjacent soft tissue densities are noted difficult to distinguish and separate from  the stomach and bowel. Masslike abnormality of the stomach is not excluded. There are porta hepatic and epigastric nodules consistent with lymphadenopathy. The subdiaphragmatic anterior right upper quadrant nodule measuring 16 mm is identified consistent with lymphadenopathy also new since previous. The left kidney is not visualized. The upper pole of the right kidney and right adrenal gland are unremarkable. The left adrenal gland is obscured by the masslike abnormalities in the left upper quadrant. Musculoskeletal: Acute minimally displaced right eighth and ninth rib fractures are identified with adjacent overlying soft tissue swelling and subcutaneous emphysema. No pneumothorax is identified. There is adjacent simple pleural effusion noted without active hemorrhage. There is also a subacute appearing left posterior seventh rib fracture with pleural thickening. IMPRESSION: 1. Acute minimally displaced posterior right eighth and ninth rib fractures with associated minimal subcutaneous emphysema and simple appearing pleural fluid and thickening. No evidence of active hemorrhage or hemothorax. No pneumothorax is identified. 2. Subacute appearing left posterior seventh rib fracture without osseous union. Associated pleural reaction and thickening is noted. 3. Calcified pleural plaque consistent with prior asbestos exposure. There is a pleural thickening may be associated with this process although the possibility of a primary or metastatic pleural disease is not excluded. 4. Bronchiectasis and mild emphysematous disease lower lobe predominant. 5.  Stable 6 mm pulmonary nodule in the right lower lobe. 6. Also of concern is evidence of new splenomegaly with ill-defined confluent masslike abnormalites in the left upper quadrant, difficult to determine whether this is emanating from the stomach, adjacent bowel or represents a large lymph node mass. More separate nodular densities consistent with epigastric, portahepatic  and subdiaphragmatic lymphadenopathy are also noted or suggested. Would recommend dedicated CT abdomen and pelvis with oral and IV contrast for better assessment. Cannot exclude the possibility of lymphoma or potentially a gastrointestinal stromal tumor in this location with metastatic adenopathy. Electronically Signed   By: Ashley Royalty M.D.   On: 09/12/2016 02:49   Dg Hip Unilat With Pelvis 2-3 Views Right  Result Date: 09/12/2016 CLINICAL DATA:  Right hip pain after fall EXAM: DG HIP (WITH OR WITHOUT PELVIS) 2-3V RIGHT COMPARISON:  None. FINDINGS: The bones are osteopenic. There is no hip fracture or pelvic diastasis. Right hip joint space is preserved. There are vascular calcifications. IMPRESSION: Osteopenia without acute right hip fracture. Electronically Signed   By: Ulyses Jarred M.D.   On: 09/12/2016 00:50    ED COURSE  Nursing notes and initial vitals signs, including pulse oximetry, reviewed.  Vitals:   09/11/16 2332 09/12/16 0119 09/12/16 0221 09/12/16 0234  BP: 100/71  128/75   Pulse: 77  98   Resp: 17  16   Temp: 98.2 F (36.8 C) 100.1 F (37.8 C) 100.1 F (37.8 C) 99 F (37.2 C)  TempSrc: Oral Oral Oral Oral  SpO2: 99%  99%    3:46 AM Patient and son advised of radiographic and lab findings. Rocephin given for urinary tract infection. Will have admitted to the hospitalist service at [facility]. CT of the abdomen and pelvis held due to elevated creatinine pending hydration.  PROCEDURES    ED DIAGNOSES     ICD-9-CM ICD-10-CM   1. Fall E888.9 W19.XXXA DG HIP UNILAT WITH PELVIS 2-3 VIEWS RIGHT     DG HIP UNILAT WITH PELVIS 2-3 VIEWS RIGHT  2. Closed fracture of multiple ribs of right side, initial encounter 807.09 S22.41XA   3. Acute cystitis without hematuria 595.0 N30.00        Azha Constantin, Jenny Reichmann, MD 09/12/16 [id number]

## 2016-09-12 NOTE — H&P (Signed)
HISTORY AND PHYSICAL       PATIENT DETAILS Name: [name] Age: 83 y.o. Sex: female Date of Birth: [birth year] Admit Date: 09/11/2016 KGY:JEHUDJS, Delfino Lovett, MD   Patient coming from: Home   CHIEF COMPLAINT:  Falls  HPI: [name] is a 83 y.o. female with medical history significant of atrial fibrillation on anticoagulation, chronic diastolic heart failure, prior history of endometrial cancer status post hysterectomy and radiation treatment presented to [facility] [place] after sustaining a mechanical fall. Please note patient is a poor historian, she is hard of hearing and probably has underlying dementia-most of this history is gathered after speaking to the patient's family at bedside. Apparently patient has had frequent falls in the past few months (at least 3-4), since last week, the patient has started to use a walker. Per family, patient is impulsive and at times starts helping her husband who has advanced dementia. Apparently yesterday, she was going to bed-" parked" her walker-and was walking to her bed when she sustained a mechanical fall. There was no loss of consciousness, there was no urinary incontinence or tongue biting. She started to complain of right-sided chest pain following the mechanical fall, and hence was brought to [facility] [place] where further imaging demonstrated right-sided rib fractures. She was also found to have a UTI. The hospitalist service was subsequently consulted-for further inpatient evaluation and management.  ED Course:  See above  Note: Lives at: Home Mobility: Cane/Walker Chronic Indwelling Foley:no   REVIEW OF SYSTEMS:  Constitutional:   No  weight loss, night sweats,  Fevers, chills, fatigue.  HEENT:    No headaches, Dysphagia,Tooth/dental problems,Sore throat,  No sneezing, itching, ear ache, nasal congestion, post nasal drip  Cardio-vascular: No Orthopnea, PND,lower extremity edema, anasarca,  palpitations  GI:  No heartburn, indigestion, abdominal pain, nausea, vomiting, diarrhea, melena or hematochezia  Resp: No shortness of breath, cough, hemoptysis,plueritic chest pain.   Skin:  No rash or lesions.  GU:  No dysuria, change in color of urine, no urgency or frequency.  No flank pain.  Musculoskeletal: No joint pain or swelling.  No decreased range of motion.  No back pain.  Endocrine: No heat intolerance, no cold intolerance, no polyuria, no polydipsia  Psych: No change in mood or affect. No depression or anxiety.  No memory loss.   ALLERGIES:   Allergies  Allergen Reactions  . Albuterol Swelling and Other (See Comments)    Pt states she was shaking  devloped tremors, swelling during PFT following albuterol    PAST MEDICAL HISTORY: Past Medical History:  Diagnosis Date  . Allergic rhinitis, cause unspecified   . Anemia   . Arrhythmia atrial fib  . Atrial fibrillation ([place])    paroxymal  . Carcinoma of uterus [facility]) Feb. 2012   High-grade serous stage Ia/vaginal cuff recurrence July 2012  . Carotid artery occlusion    carotid endarterectomy     . COPD (chronic obstructive pulmonary disease) ([place])   . Coronary artery disease   . Diverticulitis   . Hiatal hernia   . History of blood transfusion 08-26-13   1 unit given 08-26-13, 3 units given jan 2015  . History of home oxygen therapy    uses oxygen 2 liters per nasal cannula at hs  . Hyperlipidemia   . Osteoporosis   . Rectal bleeding 05-12-13   in hospital 4 days  . SOB (shortness of breath)    unexplained SOB onset  fall 2010. PFT normal 06-12-09. Right heatrt Cath 06-26-09 nl PA but wedge 23 with non obst CAD, echo same day: Left ventricle: the cavity size was normal. Wall thickness was increased in a pattern of mild LVH. Systolic function was vigorous. Estimated ejection fraction 65% to 70%. Left atrium mildly dilated. nml walking sats 11-17-2009  . Stroke ([place])    Mini  . TIA (transient ischemic  attack) yrs ago  . Unspecified essential hypertension   . Unspecified hypothyroidism     PAST SURGICAL HISTORY: Past Surgical History:  Procedure Laterality Date  . ABDOMINAL HYSTERECTOMY  April 20,2012   by Dr. Rhodia Albright at  [place]  08/04/2009   Right  CEA  . CAROTID ENDARTERECTOMY    . CHOLECYSTECTOMY  yrs ago  . External Radiation  May 7,2013- October 07, 2011   25 treatments   . FLEXIBLE SIGMOIDOSCOPY N/A 09/02/2013   Procedure: FLEXIBLE SIGMOIDOSCOPY;  Surgeon: Jerene Bears, MD;  Location: [facility];  Service: Gastroenterology;  Laterality: N/A;  . Internal Radiation  June 13,2013 and  October 24, 2011   5 dose by Dr. Belva Bertin  . TUBAL LIGATION  1970    MEDICATIONS AT HOME: Prior to Admission medications   Medication Sig Start Date End Date Taking? Authorizing Provider  acetaminophen (TYLENOL) 500 MG tablet Take 500 mg by mouth every 6 (six) hours as needed for mild pain.   Yes [provider]  apixaban (ELIQUIS) 2.5 MG TABS tablet Take 1 tablet (2.5 mg total) by mouth 2 (two) times daily. 06/25/16  Yes Dorothy Spark, MD  bisoprolol (ZEBETA) 5 MG tablet Take 0.5 tablet by mouth daily   Yes [provider]  Calcium Carb-Cholecalciferol (CALCIUM 600 + D) 600-200 MG-UNIT TABS Take 1 tablet by mouth 2 (two) times daily.   Yes [provider]  diltiazem (CARDIZEM CD) 120 MG 24 hr capsule Take 1 capsule (120 mg total) by mouth daily. 08/28/16 11/26/16 Yes Dorothy Spark, MD  FERREX 150 150 MG capsule Take 150 mg by mouth 2 (two) times daily. 09/01/16  Yes [provider]  furosemide (LASIX) 40 MG tablet Take 1 tablet (40 mg total) by mouth daily. 08/29/16 11/27/16 Yes Dorothy Spark, MD  KLOR-CON 10 10 MEQ tablet Take 10 mEq by mouth every other day. 06/20/16  Yes [provider]  levothyroxine (SYNTHROID, LEVOTHROID) 75 MCG tablet Take 75 mcg by mouth daily before breakfast.    Yes [provider]    LORazepam (ATIVAN) 0.5 MG tablet Take 1 tablet by mouth at bedtime as needed (for sleep).    Yes [provider]  Melatonin 10 MG TABS Take by mouth.   Yes [provider]  oxybutynin (DITROPAN) 5 MG tablet Take 5 mg by mouth 2 (two) times daily.   Yes [provider]  simvastatin (ZOCOR) 40 MG tablet Take 40 mg by mouth daily.    Yes [provider]  Tiotropium Bromide Monohydrate (SPIRIVA RESPIMAT) 2.5 MCG/ACT AERS INHALE 2 PUFFS INTO THE LUNGS DAILY. 06/02/15  Yes Brand Males, MD  nitroGLYCERIN (NITROSTAT) 0.4 MG SL tablet Place 0.4 mg under the tongue every 5 (five) minutes as needed for chest pain.     [provider]    FAMILY HISTORY: Family History  Problem Relation Age of Onset  . Heart disease Mother   . Hypertension Mother   . Stroke Mother   . Heart disease Sister 12  Heart Disease before age 50  . Lung cancer Sister   . COPD Sister   . Hypertension Sister   . Heart disease Brother        Before age 90  . Cancer Brother   . Hypertension Brother   . Heart attack Brother   . Coronary artery disease Other   . Breast cancer Daughter 43  . Heart attack Daughter   . Heart disease Daughter        Before age 5  . Prostate cancer Son   . Heart attack Son     SOCIAL HISTORY:  reports that she is a non-smoker but has been exposed to tobacco smoke. She has never used smokeless tobacco. She reports that she does not drink alcohol or use drugs.  PHYSICAL EXAM: Blood pressure 109/60, pulse (!) 101, temperature 98.8 F (37.1 C), temperature source Oral, resp. rate 12, height 5\' 2"  (1.575 m), weight 44.3 kg (97 lb 10.6 oz), SpO2 95 %.  General appearance :Awake, alert-but mildly confused, not in any distress. Speech Clear.  Eyes:, pupils equally reactive to light and accomodation,no scleral icterus.Pink conjunctiva HEENT: Atraumatic and Normocephalic Neck: supple, no JVD. No cervical lymphadenopathy. No  thyromegaly Resp:Good air entry bilaterally, no added sounds  CVS: S1 S2 irregular GI: Bowel sounds present, Non tender and not distended with no gaurding, rigidity or rebound.No organomegaly Extremities: B/L Lower Ext shows no edema, both legs are warm to touch Neurology:  speech clear,Non focal, sensation is grossly intact. Psychiatric: Pleasantly confused Musculoskeletal:No digital cyanosis Skin:No Rash, warm and dry Wounds:N/A  LABS ON ADMISSION:  I have personally reviewed following labs and imaging studies  CBC:  Recent Labs Lab 09/12/16 0123  WBC 11.7*  NEUTROABS 9.6*  HGB 10.2*  HCT 33.5*  MCV 86.1  PLT 326    Basic Metabolic Panel:  Recent Labs Lab 09/12/16 0123  NA 139  K 3.4*  CL 97*  CO2 30  GLUCOSE 115*  BUN 27*  CREATININE 1.51*  CALCIUM 10.5*    GFR: Estimated Creatinine Clearance: 17.3 mL/min (A) (by C-G formula based on SCr of 1.51 mg/dL (H)).  Liver Function Tests:  Recent Labs Lab 09/12/16 0123  AST 26  ALT 12*  ALKPHOS 103  BILITOT 0.5  PROT 6.3*  ALBUMIN 3.2*   No results for input(s): LIPASE, AMYLASE in the last 168 hours. No results for input(s): AMMONIA in the last 168 hours.  Coagulation Profile: No results for input(s): INR, PROTIME in the last 168 hours.  Cardiac Enzymes: No results for input(s): CKTOTAL, CKMB, CKMBINDEX, TROPONINI in the last 168 hours.  BNP (last 3 results)  Recent Labs  08/28/16 1243  PROBNP 2,384*    HbA1C: No results for input(s): HGBA1C in the last 72 hours.  CBG: No results for input(s): GLUCAP in the last 168 hours.  Lipid Profile: No results for input(s): CHOL, HDL, LDLCALC, TRIG, CHOLHDL, LDLDIRECT in the last 72 hours.  Thyroid Function Tests: No results for input(s): TSH, T4TOTAL, FREET4, T3FREE, THYROIDAB in the last 72 hours.  Anemia Panel: No results for input(s): VITAMINB12, FOLATE, FERRITIN, TIBC, IRON, RETICCTPCT in the last 72 hours.  Urine analysis:    Component  Value Date/Time   COLORURINE YELLOW 09/12/2016 0225   APPEARANCEUR CLOUDY (A) 09/12/2016 0225   LABSPEC 1.011 09/12/2016 0225   LABSPEC 1.020 02/18/2011 1021   PHURINE 7.5 09/12/2016 0225   GLUCOSEU NEGATIVE 09/12/2016 0225   HGBUR TRACE (A) 09/12/2016 0225   BILIRUBINUR NEGATIVE 09/12/2016  0225   BILIRUBINUR Negative 02/18/2011 1021   KETONESUR NEGATIVE 09/12/2016 0225   PROTEINUR NEGATIVE 09/12/2016 0225   UROBILINOGEN 0.2 10/06/2013 1432   NITRITE POSITIVE (A) 09/12/2016 0225   LEUKOCYTESUR LARGE (A) 09/12/2016 0225   LEUKOCYTESUR Large 02/18/2011 1021    Sepsis Labs: Lactic Acid, Venous No results found for: [place]   Microbiology: No results found for this or any previous visit (from the past 240 hour(s)).    RADIOLOGIC STUDIES ON ADMISSION: Dg Ribs Unilateral W/chest Right  Result Date: 09/12/2016 CLINICAL DATA:  Patient fell 4 hours ago with anterior lower right rib pain. EXAM: RIGHT RIBS AND CHEST - 3+ VIEW COMPARISON:  12/22/2014 FINDINGS: An acute minimally displaced right lateral eighth rib fracture is noted without pneumothorax. Adjacent pulmonary parenchymal opacity however is seen which may represent a pulmonary contusion. Cannot exclude a small hemothorax contributing to the opacity. No blunting of the costophrenic angles. There is borderline cardiomegaly with aortic atherosclerosis. There is bronchiectasis to both lower lobes. Upper lobe hyperinflation is seen. There is subsegmental atelectasis and/or scarring at the left lung base. IMPRESSION: 1. Acute right lateral minimally displaced eighth rib fracture with associated pulmonary parenchymal opacity which may represent a pulmonary contusion. An adjacent small hemothorax is not entirely excluded. Follow-up radiographs to assure stability is suggested. No pneumothorax is noted. 2. Aortic atherosclerosis with stable cardiomegaly. Electronically Signed   By: Ashley Royalty M.D.   On: 09/12/2016 00:53   Ct Chest Wo  Contrast  Result Date: 09/12/2016 CLINICAL DATA:  Rib pain after fall EXAM: CT CHEST WITHOUT CONTRAST TECHNIQUE: Multidetector CT imaging of the chest was performed following the standard protocol without IV contrast. COMPARISON:  CT from 01/21/2014 of the chest and CT abdomen from 05/17/2010 FINDINGS: Cardiovascular: The heart is normal in size. There is minimal pericardial thickening anteriorly. Mitral annular calcifications are present with 3 vessel coronary arteriosclerosis and aortic atherosclerosis. No aortic aneurysm is identified with maximum caliber of 3.4 cm noted along the ascending aorta. Normal 3 vessel takeoff from the aortic arch with atherosclerosis at the origins of the right brachiocephalic and left subclavian arteries. Mediastinum/Nodes: The thyroid gland is normal in size. No axillary or supraclavicular adenopathy. Stable 9 mm short axis right lower paratracheal lymph node ridges 8 mm previously. Smaller lymph nodes are present within the mediastinum. Mild dilatation of the main pulmonary artery to 3 cm consistent with pulmonary hypertension. Lungs/Pleura: Biapical pleuroparenchymal scarring is seen. Calcified pleural plaque is noted bilaterally consistent with prior asbestos exposure. There are more focal areas of pleural thickening along the periphery of the right upper thorax, one are in particular encompassing the anterolateral fifth rib. A stable 6 mm pulmonary nodule is noted in the right lower lobe unchanged in appearance. Emphysematous disease in both lower lobes with bronchiectasis is identified similar to prior. Upper Abdomen: There is new splenomegaly with the spleen measuring up to 14.4 cm in AP dimension. Adjacent soft tissue densities are noted difficult to distinguish and separate from the stomach and bowel. Masslike abnormality of the stomach is not excluded. There are porta hepatic and epigastric nodules consistent with lymphadenopathy. The subdiaphragmatic anterior right upper  quadrant nodule measuring 16 mm is identified consistent with lymphadenopathy also new since previous. The left kidney is not visualized. The upper pole of the right kidney and right adrenal gland are unremarkable. The left adrenal gland is obscured by the masslike abnormalities in the left upper quadrant. Musculoskeletal: Acute minimally displaced right eighth and ninth rib fractures are  identified with adjacent overlying soft tissue swelling and subcutaneous emphysema. No pneumothorax is identified. There is adjacent simple pleural effusion noted without active hemorrhage. There is also a subacute appearing left posterior seventh rib fracture with pleural thickening. IMPRESSION: 1. Acute minimally displaced posterior right eighth and ninth rib fractures with associated minimal subcutaneous emphysema and simple appearing pleural fluid and thickening. No evidence of active hemorrhage or hemothorax. No pneumothorax is identified. 2. Subacute appearing left posterior seventh rib fracture without osseous union. Associated pleural reaction and thickening is noted. 3. Calcified pleural plaque consistent with prior asbestos exposure. There is a pleural thickening may be associated with this process although the possibility of a primary or metastatic pleural disease is not excluded. 4. Bronchiectasis and mild emphysematous disease lower lobe predominant. 5. Stable 6 mm pulmonary nodule in the right lower lobe. 6. Also of concern is evidence of new splenomegaly with ill-defined confluent masslike abnormalites in the left upper quadrant, difficult to determine whether this is emanating from the stomach, adjacent bowel or represents a large lymph node mass. More separate nodular densities consistent with epigastric, portahepatic and subdiaphragmatic lymphadenopathy are also noted or suggested. Would recommend dedicated CT abdomen and pelvis with oral and IV contrast for better assessment. Cannot exclude the possibility of  lymphoma or potentially a gastrointestinal stromal tumor in this location with metastatic adenopathy. Electronically Signed   By: Ashley Royalty M.D.   On: 09/12/2016 02:49   Dg Hip Unilat With Pelvis 2-3 Views Right  Result Date: 09/12/2016 CLINICAL DATA:  Right hip pain after fall EXAM: DG HIP (WITH OR WITHOUT PELVIS) 2-3V RIGHT COMPARISON:  None. FINDINGS: The bones are osteopenic. There is no hip fracture or pelvic diastasis. Right hip joint space is preserved. There are vascular calcifications. IMPRESSION: Osteopenia without acute right hip fracture. Electronically Signed   By: Ulyses Jarred M.D.   On: 09/12/2016 00:50    I have personally reviewed images of chest xray or the CT chest  EKG:  Not done  ASSESSMENT AND PLAN: Mechanical fall: Probably has some element of disequilibrium syndrome-however will check orthostatics, monitor in telemetry. PT evaluation. Will check vitamin D levels and supplement if low. See below regarding anticoagulation.  UTI: Very hard to discern whether patient has symptoms given dementia-we will continue with Rocephin for a total of 3 days. Await urine culture  Right-sided rib fractures (acute eighth and ninth rib fracture, subacute left seventh rib fracture): Probably secondary to multiple falls. Supportive care, incentive spirometry.  New splenomegaly/lower abdominal lymphadenopathy/mass in the left upper quadrant: Patient has a history of prior endometrial cancer-not sure whether this is recurrence of endometrial cancer or another malignancy. After discussion with family, we have decided to pursue a CT scan of the abdomen-family is very well aware that we would not be able to further address this issue while inpatient, family also very well aware that given patient's advanced age-patient in no longer be a candidate for aggressive therapies. Family will discuss among themselves and discuss with patient's outpatient MDs.  Chronic atrial fibrillation: Continue with  the bisoprolol, on Eliquis-CHADSvasc score of at least 4. I have had long discussion with the patient's daughters (over the phone and at bedside)-regarding risks/benefits of continued anticoagulation. Apparently patient has started to fall more frequently in the past few months, family aware that patient may be approaching a point where the risks of anticoagulation are starting to outweigh potential benefits. Family will discussed amongst themselves, and will let us know in the next few  days whether or not to continue anticoagulation. Family aware that if we do decide to stop anticoagulation, we will place the patient on aspirin-and aware of the increased risk of stroke and other cardioembolic phenomena.  Chronic diastolic heart failure: Volume status appears to be stable-continue Lasix. Watch/follow daily weights, intake/output.  Chronic kidney disease stage III: Creatinine appears to be close to usual baseline, follow periodically  COPD on home nocturnal O2: COPD seems stable-continue usual bronchodilators.   Prior history of endometrial cancer: See above  Prior history of right lung nodule: Stable per CT scan-defer further to another daughter outpatient MDs.  Further plan will depend as patient's clinical course evolves and further radiologic and laboratory data become available. Patient will be monitored closely.  Above noted plan was discussed with patient/family face to face at bedside/phone, they were in agreement.   CONSULTS: None  DVT Prophylaxis: Eliquis  Code Status: DNR-confirmed with the patient's daughter at bedside and subsequently with another daughter over the phone  Disposition Plan:  Discharge back home  possibly in 1-2 days, depending on clinical course  Admission status: Inpatient  going to tele  Total time spent  55 minutes.Greater than 50% of this time was spent in counseling, explanation of diagnosis, planning of further management, and coordination of  care.  Oren Binet [facility] Pager [phone]  If 7PM-7AM, please contact night-coverage [email or web address] Password [facility] 09/12/2016, 8:13 AM

## 2016-09-12 NOTE — ED Notes (Signed)
When rechecking VS pt began to complain of left upper arm pain. EDP notified.

## 2016-09-12 NOTE — Telephone Encounter (Signed)
Daughter is calling as the Internal Physician at [facility], advised her too.  Daughter states that the pt was admitted to [facility] yesterday for a fall.  Daughter reports that the pt had a bad fall and broke several ribs.  Daughter states that they also found that the pt has a bladder infection, and a spot on her abdomen that is suggestive of cancer.  Daughter states that the admitting MD wants to take the pt off of her Eliquis and switch her to ASA only, due to fall risk.  Daughter states that the admitting MD informed the family that this will increase her risk for a stroke coming off of Eliquis, but with her recurrent falls and recent findings, that's the safest thing for the pt.  Daughter would like Dr Francesca Oman input on this.  Daughter states that the pt is still admitted and will be in the hospital for awhile.  Daughter states that they will not stop her Eliquis, until the family gives permission too.  Daughter wants advice on this issue from Dr Meda Coffee.  Informed the pts Daughter that I will route this request to Dr Meda Coffee to review and advise on, and follow-up with the daughter shortly thereafter.  Daughter verbalized understanding and agrees with this plan.  Daughter gracious for all the assistance provided.

## 2016-09-12 NOTE — ED Notes (Addendum)
Report called to [facility] mentioned left arm pain in report to RN [facility]

## 2016-09-12 NOTE — Plan of Care (Signed)
Problem: Education: Goal: Knowledge of [facility] Education information/materials will improve Outcome: Completed/Met Date Met: 09/12/16 Discussed with patient s family

## 2016-09-12 NOTE — Telephone Encounter (Signed)
New message    Pt daughter is calling because pt had a fall last night. She states the doctor at the hospital told them she needs to stop her blood thinner and substitute aspirin instead. Pt daughter wants to talk about this before they make a decision.

## 2016-09-13 DIAGNOSIS — W19XXXD Unspecified fall, subsequent encounter: Secondary | ICD-10-CM

## 2016-09-13 DIAGNOSIS — I5032 Chronic diastolic (congestive) heart failure: Secondary | ICD-10-CM

## 2016-09-13 LAB — CBC
HCT: 27.5 % — ABNORMAL LOW (ref 36.0–46.0)
Hemoglobin: 8.6 g/dL — ABNORMAL LOW (ref 12.0–15.0)
MCH: 26.5 pg (ref 26.0–34.0)
MCHC: 31.3 g/dL (ref 30.0–36.0)
MCV: 84.6 fL (ref 78.0–100.0)
PLATELETS: 223 10*3/uL (ref 150–400)
RBC: 3.25 MIL/uL — AB (ref 3.87–5.11)
RDW: 14.2 % (ref 11.5–15.5)
WBC: 8.1 10*3/uL (ref 4.0–10.5)

## 2016-09-13 LAB — BASIC METABOLIC PANEL
Anion gap: 12 (ref 5–15)
BUN: 30 mg/dL — ABNORMAL HIGH (ref 6–20)
CALCIUM: 10.2 mg/dL (ref 8.9–10.3)
CHLORIDE: 93 mmol/L — AB (ref 101–111)
CO2: 31 mmol/L (ref 22–32)
CREATININE: 1.73 mg/dL — AB (ref 0.44–1.00)
GFR calc non Af Amer: 25 mL/min — ABNORMAL LOW (ref >60)
GFR, EST AFRICAN AMERICAN: 29 mL/min — AB (ref >60)
Glucose, Bld: 117 mg/dL — ABNORMAL HIGH (ref 65–99)
Potassium: 3.8 mmol/L (ref 3.5–5.1)
SODIUM: 136 mmol/L (ref 135–145)

## 2016-09-13 LAB — VITAMIN D 25 HYDROXY (VIT D DEFICIENCY, FRACTURES): Vit D, 25-Hydroxy: 45.5 ng/mL (ref 30.0–100.0)

## 2016-09-13 MED ORDER — ASPIRIN EC 81 MG PO TBEC: 81.0000 mg | DELAYED_RELEASE_TABLET | Freq: Every day | ORAL | Status: AC

## 2016-09-13 MED ORDER — CIPROFLOXACIN HCL 250 MG PO TABS: 250.0000 mg | ORAL_TABLET | Freq: Two times a day (BID) | ORAL | 0 refills | Status: AC

## 2016-09-13 MED ORDER — TRAMADOL HCL 50 MG PO TABS: 50.0000 mg | ORAL_TABLET | Freq: Four times a day (QID) | ORAL | 0 refills | Status: AC | PRN

## 2016-09-13 NOTE — Progress Notes (Signed)
Discharge plan:  This CM met with pt and family for discharge planning. Pt and family offered choice for home health services and [facility] chosen. [facility] rep contacted for referral. Private duty sitter list given to family. No equipment needs communicated. Marney Doctor RN,BSN,NCM [phone]

## 2016-09-13 NOTE — Discharge Summary (Signed)
Physician Discharge Summary  [name] HEN:[id number] DOB: May 19, 1923 DOA: 09/11/2016  PCP: Linda Bunting, MD  Admit date: 09/11/2016 Discharge date: 09/13/2016  Time spent: 45 minutes  Recommendations for Outpatient Follow-up:  -Will be discharged home today. -[facility] services will be arranged as family has refused SNF placement. -To complete 5 days of cipro for her UTI.   Discharge Diagnoses:  Principal Problem:   Fall Active Problems:   Hypothyroidism   Atrial fibrillation ([place])   DIASTOLIC HEART FAILURE, CHRONIC   Malignant neoplasm of corpus uteri (HCC)   Lung nodule   Right rib fracture   Acute cystitis without hematuria   COPD (chronic obstructive pulmonary disease) (HCC)   Chronic respiratory failure with hypoxia (HCC)   CKD stage G3b/A1, GFR 30-44 and albumin creatinine ratio <30 mg/g   Discharge Condition: Stable and improved  Filed Weights   09/12/16 0536 09/13/16 0609  Weight: 44.3 kg (97 lb 10.6 oz) 44.5 kg (98 lb 1.7 oz)    History of present illness:  As per Dr. Sloan Hammond on 5/17: [name] is a 83 y.o. female with medical history significant of atrial fibrillation on anticoagulation, chronic diastolic heart failure, prior history of endometrial cancer status post hysterectomy and radiation treatment presented to [facility] [place] [facility] after sustaining a mechanical fall. Please note patient is a poor historian, she is hard of hearing and probably has underlying dementia-most of this history is gathered after speaking to the patient's family at bedside. Apparently patient has had frequent falls in the past few months (at least 3-4), since last week, the patient has started to use a walker. Per family, patient is impulsive and at times starts helping her husband who has advanced dementia. Apparently yesterday, she was going to bed-" parked" her walker-and was walking to her bed when she sustained a mechanical fall. There was no loss of consciousness,  there was no urinary incontinence or tongue biting. She started to complain of right-sided chest pain following the mechanical fall, and hence was brought to [facility] [place] where further imaging demonstrated right-sided rib fractures. She was also found to have a UTI. The hospitalist service was subsequently consulted-for further inpatient evaluation and management.  Hospital Course:   Frequent falls -Most recent fall resulting in acute right eighth and ninth rib fractures. -PT has recommended SNF, however family is not interested. -Will ask CM to arrange [facility] therapies instead. -Tramadol PRN for pain associated with acute rib fractures.  UTI -Cx data is pending at time of DC. -Will send home on cipro for 5 days.  Chronic A Fib -Rate controlled. -Given her age, frequent falls with rib fractures and now the possibility of an intraabdominal malignancy, we have recommended cessation of her blood thinner. Patient and family agree. -Will DC on ASA.  LUQ Abdominal Mass -With mesenteric lymphadenopathy and possible left lateral chest wall metastasis. -Discussed in detail with 2 daughters and son in law. -Impossible to know exactly what this is, but does appear to be malignant based on imaging. -She has a prior h/o uterine cancer. -We discuss that given her age and comorbidities, obtaining a tissue diagnosis with biopsy and resulting therapy for this tumor would not be in her best interests and may cause more symptoms and distress than she currently has. -We have agreed to not pursue this further at this time; however, they will talk to patient's PCP and see if they have any further recommendations.   Procedures:  None  Consultations:  None  Discharge Instructions  Discharge Instructions    Diet - low sodium heart healthy    Complete by:  As directed    Increase activity slowly    Complete by:  As directed      Allergies as of 09/13/2016      Reactions   Albuterol Swelling,  Other (See Comments)   Pt states she was shaking  devloped tremors, swelling during PFT following albuterol      Medication List    STOP taking these medications   apixaban 2.5 MG Tabs tablet Commonly known as:  ELIQUIS     TAKE these medications   acetaminophen 500 MG tablet Commonly known as:  TYLENOL Take 500 mg by mouth every 6 (six) hours as needed for mild pain.   aspirin EC 81 MG tablet Take 1 tablet (81 mg total) by mouth daily.   bisoprolol 5 MG tablet Commonly known as:  ZEBETA Take 0.5 tablet by mouth daily   CALCIUM 600 + D 600-200 MG-UNIT Tabs Generic drug:  Calcium Carb-Cholecalciferol Take 1 tablet by mouth 2 (two) times daily.   ciprofloxacin 250 MG tablet Commonly known as:  CIPRO Take 1 tablet (250 mg total) by mouth 2 (two) times daily.   diltiazem 120 MG 24 hr capsule Commonly known as:  CARDIZEM CD Take 1 capsule (120 mg total) by mouth daily.   FERREX 150 150 MG capsule Generic drug:  iron polysaccharides Take 150 mg by mouth 2 (two) times daily.   furosemide 40 MG tablet Commonly known as:  LASIX Take 1 tablet (40 mg total) by mouth daily.   KLOR-CON 10 10 MEQ tablet Generic drug:  potassium chloride Take 10 mEq by mouth every other day.   levothyroxine 75 MCG tablet Commonly known as:  SYNTHROID, LEVOTHROID Take 75 mcg by mouth daily before breakfast.   LORazepam 0.5 MG tablet Commonly known as:  ATIVAN Take 1 tablet by mouth at bedtime as needed (for sleep).   Melatonin 10 MG Tabs Take by mouth.   nitroGLYCERIN 0.4 MG SL tablet Commonly known as:  NITROSTAT Place 0.4 mg under the tongue every 5 (five) minutes as needed for chest pain.   oxybutynin 5 MG tablet Commonly known as:  DITROPAN Take 5 mg by mouth 2 (two) times daily.   simvastatin 40 MG tablet Commonly known as:  ZOCOR Take 40 mg by mouth daily.   Tiotropium Bromide Monohydrate 2.5 MCG/ACT Aers Commonly known as:  SPIRIVA RESPIMAT INHALE 2 PUFFS INTO THE LUNGS  DAILY.   traMADol 50 MG tablet Commonly known as:  ULTRAM Take 1 tablet (50 mg total) by mouth every 6 (six) hours as needed for moderate pain.      Allergies  Allergen Reactions  . Albuterol Swelling and Other (See Comments)    Pt states she was shaking  devloped tremors, swelling during PFT following albuterol   Follow-up Information    Linda Bunting, MD. Schedule an appointment as soon as possible for a visit in 2 week(s).   Specialty:  Internal Medicine Contact information: [place] [address] [phone]            The results of significant diagnostics from this hospitalization (including imaging, microbiology, ancillary and laboratory) are listed below for reference.    Significant Diagnostic Studies: Ct Abdomen Pelvis Wo Contrast  Result Date: 09/12/2016 CLINICAL DATA:  Abnormal soft tissue in the left upper abdomen on CT chest. History of endometrial cancer. EXAM: CT  ABDOMEN AND PELVIS WITHOUT CONTRAST TECHNIQUE: Multidetector CT imaging of the abdomen and pelvis was performed following the standard protocol without IV contrast. COMPARISON:  CT chest dated 09/02/2016 at 0219 hours. CT abdomen/pelvis dated 05/17/2010. FINDINGS: Lower chest: Unchanged from recent CT chest. Right posterolateral 8th and 9th rib fractures. Abnormal soft tissue encasing the right anterolateral 5th rib (series 2/image 4). Possible intramuscular metastasis along the left lateral chest wall (series 2/image 4). Small bilateral pleural effusions. Hepatobiliary: Two hypodense hepatic lesions in segment 4B measuring up to 3.6 cm (series 2/ image 20), new from 2012, suspicious for metastases. Gallbladder is unremarkable. No intrahepatic or extrahepatic ductal dilatation. Pancreas: Parenchymal atrophy. Spleen: Mildly enlarged, measuring 14.1 cm in maximal axial dimension. Adrenals/Urinary Tract: 1.5 cm right adrenal nodule (series 2/image 20), unchanged from 2012, likely benign. Left  adrenal gland is not discretely visualized. Right kidney is within normal limits. Left kidney is inferiorly displaced but grossly unremarkable. No hydronephrosis. Bladder is mildly thick-walled although underdistended. Stomach/Bowel: 6.7 x 11.1 cm soft tissue mass in the left upper abdomen, adjacent to the gastric cardia and splenic hilum (series 2/ image 21). Additional abnormal soft tissue encasing in the gastric antrum (series 2/ image 32). Additional 4.6 x 10.1 cm soft tissue implant inferior to the stomach in the anterior mid abdomen (series 2/ image 39), suspicious for peritoneal metastases. Additional peritoneal implants/omental caking inferiorly (series 2/ image 42). Overall, this appearance is worrisome for gastric GIST with peritoneal disease or possibly lymphoma. Visualized bowel is unremarkable. No evidence of bowel obstruction. Vascular/Lymphatic: No evidence of abdominal aortic aneurysm. Atherosclerotic calcifications of the abdominal aorta and branch vessels. No suspicious abdominopelvic lymphadenopathy. Reproductive: Status post hysterectomy. No adnexal masses. Other: No abdominopelvic ascites. Musculoskeletal: Degenerative changes of the visualized thoracolumbar spine. Benign bone island along the posterior aspect of the L2 vertebral body (series 6/ image 51), chronic. IMPRESSION: 6.7 x 11.1 cm soft tissue mass in the left upper abdomen, adjacent to the gastric cardia and splenic hilum. Additional abnormal soft tissue encasing the gastric antrum. While poorly evaluated in the absence of intravenous contrast, this appearance favors gastric GIST, less likely lymphoma. Additional soft tissue implant/peritoneal carcinomatosis along the anterior mid abdomen, measuring up to 4.6 x 10.1 cm. Spleen is mildly enlarged. Right 8th and 9th rib fractures. Suspected right 5th rib metastasis. Possible left lateral chest wall metastasis. These findings are unchanged from recent CT chest. Electronically Signed   By:  Julian Hy M.D.   On: 09/12/2016 15:10   Dg Ribs Unilateral W/chest Right  Result Date: 09/12/2016 CLINICAL DATA:  Patient fell 4 hours ago with anterior lower right rib pain. EXAM: RIGHT RIBS AND CHEST - 3+ VIEW COMPARISON:  12/22/2014 FINDINGS: An acute minimally displaced right lateral eighth rib fracture is noted without pneumothorax. Adjacent pulmonary parenchymal opacity however is seen which may represent a pulmonary contusion. Cannot exclude a small hemothorax contributing to the opacity. No blunting of the costophrenic angles. There is borderline cardiomegaly with aortic atherosclerosis. There is bronchiectasis to both lower lobes. Upper lobe hyperinflation is seen. There is subsegmental atelectasis and/or scarring at the left lung base. IMPRESSION: 1. Acute right lateral minimally displaced eighth rib fracture with associated pulmonary parenchymal opacity which may represent a pulmonary contusion. An adjacent small hemothorax is not entirely excluded. Follow-up radiographs to assure stability is suggested. No pneumothorax is noted. 2. Aortic atherosclerosis with stable cardiomegaly. Electronically Signed   By: Ashley Royalty M.D.   On: 09/12/2016 00:53   Ct Chest Wo  Contrast  Result Date: 09/12/2016 CLINICAL DATA:  Rib pain after fall EXAM: CT CHEST WITHOUT CONTRAST TECHNIQUE: Multidetector CT imaging of the chest was performed following the standard protocol without IV contrast. COMPARISON:  CT from 01/21/2014 of the chest and CT abdomen from 05/17/2010 FINDINGS: Cardiovascular: The heart is normal in size. There is minimal pericardial thickening anteriorly. Mitral annular calcifications are present with 3 vessel coronary arteriosclerosis and aortic atherosclerosis. No aortic aneurysm is identified with maximum caliber of 3.4 cm noted along the ascending aorta. Normal 3 vessel takeoff from the aortic arch with atherosclerosis at the origins of the right brachiocephalic and left subclavian  arteries. Mediastinum/Nodes: The thyroid gland is normal in size. No axillary or supraclavicular adenopathy. Stable 9 mm short axis right lower paratracheal lymph node ridges 8 mm previously. Smaller lymph nodes are present within the mediastinum. Mild dilatation of the main pulmonary artery to 3 cm consistent with pulmonary hypertension. Lungs/Pleura: Biapical pleuroparenchymal scarring is seen. Calcified pleural plaque is noted bilaterally consistent with prior asbestos exposure. There are more focal areas of pleural thickening along the periphery of the right upper thorax, one are in particular encompassing the anterolateral fifth rib. A stable 6 mm pulmonary nodule is noted in the right lower lobe unchanged in appearance. Emphysematous disease in both lower lobes with bronchiectasis is identified similar to prior. Upper Abdomen: There is new splenomegaly with the spleen measuring up to 14.4 cm in AP dimension. Adjacent soft tissue densities are noted difficult to distinguish and separate from the stomach and bowel. Masslike abnormality of the stomach is not excluded. There are porta hepatic and epigastric nodules consistent with lymphadenopathy. The subdiaphragmatic anterior right upper quadrant nodule measuring 16 mm is identified consistent with lymphadenopathy also new since previous. The left kidney is not visualized. The upper pole of the right kidney and right adrenal gland are unremarkable. The left adrenal gland is obscured by the masslike abnormalities in the left upper quadrant. Musculoskeletal: Acute minimally displaced right eighth and ninth rib fractures are identified with adjacent overlying soft tissue swelling and subcutaneous emphysema. No pneumothorax is identified. There is adjacent simple pleural effusion noted without active hemorrhage. There is also a subacute appearing left posterior seventh rib fracture with pleural thickening. IMPRESSION: 1. Acute minimally displaced posterior right  eighth and ninth rib fractures with associated minimal subcutaneous emphysema and simple appearing pleural fluid and thickening. No evidence of active hemorrhage or hemothorax. No pneumothorax is identified. 2. Subacute appearing left posterior seventh rib fracture without osseous union. Associated pleural reaction and thickening is noted. 3. Calcified pleural plaque consistent with prior asbestos exposure. There is a pleural thickening may be associated with this process although the possibility of a primary or metastatic pleural disease is not excluded. 4. Bronchiectasis and mild emphysematous disease lower lobe predominant. 5. Stable 6 mm pulmonary nodule in the right lower lobe. 6. Also of concern is evidence of new splenomegaly with ill-defined confluent masslike abnormalites in the left upper quadrant, difficult to determine whether this is emanating from the stomach, adjacent bowel or represents a large lymph node mass. More separate nodular densities consistent with epigastric, portahepatic and subdiaphragmatic lymphadenopathy are also noted or suggested. Would recommend dedicated CT abdomen and pelvis with oral and IV contrast for better assessment. Cannot exclude the possibility of lymphoma or potentially a gastrointestinal stromal tumor in this location with metastatic adenopathy. Electronically Signed   By: Ashley Royalty M.D.   On: 09/12/2016 02:49   Dg Hip Unilat With Pelvis 2-3  Views Right  Result Date: 09/12/2016 CLINICAL DATA:  Right hip pain after fall EXAM: DG HIP (WITH OR WITHOUT PELVIS) 2-3V RIGHT COMPARISON:  None. FINDINGS: The bones are osteopenic. There is no hip fracture or pelvic diastasis. Right hip joint space is preserved. There are vascular calcifications. IMPRESSION: Osteopenia without acute right hip fracture. Electronically Signed   By: Ulyses Jarred M.D.   On: 09/12/2016 00:50    Microbiology: Recent Results (from the past 240 hour(s))  Urine culture     Status: Abnormal  (Preliminary result)   Collection Time: 09/12/16  2:25 AM  Result Value Ref Range Status   Specimen Description URINE, RANDOM  Final   Special Requests NONE  Final   Culture (A)  Final    >=100,000 COLONIES/mL GRAM NEGATIVE RODS IDENTIFICATION AND SUSCEPTIBILITIES TO FOLLOW Performed at [facility], [address], [place], [place] [address]    Report Status PENDING  Incomplete     Labs: Basic Metabolic Panel:  Recent Labs Lab 09/12/16 0123 09/13/16 0525  NA 139 136  K 3.4* 3.8  CL 97* 93*  CO2 30 31  GLUCOSE 115* 117*  BUN 27* 30*  CREATININE 1.51* 1.73*  CALCIUM 10.5* 10.2   Liver Function Tests:  Recent Labs Lab 09/12/16 0123  AST 26  ALT 12*  ALKPHOS 103  BILITOT 0.5  PROT 6.3*  ALBUMIN 3.2*   No results for input(s): LIPASE, AMYLASE in the last 168 hours. No results for input(s): AMMONIA in the last 168 hours. CBC:  Recent Labs Lab 09/12/16 0123 09/13/16 0525  WBC 11.7* 8.1  NEUTROABS 9.6*  --   HGB 10.2* 8.6*  HCT 33.5* 27.5*  MCV 86.1 84.6  PLT 261 223   Cardiac Enzymes: No results for input(s): CKTOTAL, CKMB, CKMBINDEX, TROPONINI in the last 168 hours. BNP: BNP (last 3 results) No results for input(s): BNP in the last 8760 hours.  ProBNP (last 3 results)  Recent Labs  08/28/16 1243  PROBNP 2,384*    CBG: No results for input(s): GLUCAP in the last 168 hours.     SignedLelon Frohlich  [facility] Pager: [phone] 09/13/2016, 12:02 PM

## 2016-09-13 NOTE — Evaluation (Signed)
Physical Therapy Evaluation Patient Details Name: [name] MRN: [id number] DOB: 13-Dec-1923 Today's Date: 09/13/2016   History of Present Illness  83 yo female admitted with fall, R side rib fractures. Hx of Afib, HF, endometrial cancer, freq falls, COPD, CVA, osteoporosis.     Clinical Impression  On eval, pt required Min assist for mobility. She walked ~50 feet with a RW. Pt presents with general weakness, decreased activity tolerance, and impaired gait and balance. She is at high risk for falls! Discussed d/c plan with family-daughter would like pt to return home. If pt returns home, highly recommend 24/7 supervision/assist.     Follow Up Recommendations SNF ([facility] if family declines placement. Pt will require 24/7 supervision/assist due to high fall risk. )    Equipment Recommendations  None recommended by PT    Recommendations for Other Services       Precautions / Restrictions Precautions Precautions: Fall Precaution Comments: high fall risk Restrictions Weight Bearing Restrictions: No      Mobility  Bed Mobility Overal bed mobility: Needs Assistance Bed Mobility: Supine to Sit     Supine to sit: HOB elevated;Min assist     General bed mobility comments: Assist for trunk and to get to EOB. Increased time.   Transfers Overall transfer level: Needs assistance Equipment used: Rolling walker (2 wheeled) Transfers: Sit to/from Stand Sit to Stand: Min assist         General transfer comment: Assist to rise, stabilize, control descent. VCs safety, hand placement. Very unsteady. High risk for falls.   Ambulation/Gait Ambulation/Gait assistance: Min assist Ambulation Distance (Feet): 50 Feet Assistive device: Rolling walker (2 wheeled) Gait Pattern/deviations: Step-through pattern;Decreased stride length     General Gait Details: Assist to stabilze pt and maneuver with RW. Very unsteady. High risk for falls. Pt fatigues quickly. She requested to return to  her room.  Stairs            Wheelchair Mobility    Modified Rankin (Stroke Patients Only)       Balance Overall balance assessment: Needs assistance;History of Falls           Standing balance-Leahy Scale: Poor                               Pertinent Vitals/Pain Pain Assessment: Faces Faces Pain Scale: Hurts whole lot Pain Location: L upper arm Pain Descriptors / Indicators: Sore;Aching Pain Intervention(s): Monitored during session    Home Living Family/patient expects to be discharged to:: Unsure Living Arrangements: Spouse/significant other (has dementia) Available Help at Discharge: Family;Personal care attendant (aide 7-3 daily) Type of Home: House         Home Equipment: Gilford Rile - 2 wheels      Prior Function Level of Independence: Needs assistance   Gait / Transfers Assistance Needed: using RW           Hand Dominance        Extremity/Trunk Assessment   Upper Extremity Assessment Upper Extremity Assessment: Generalized weakness (pt c/o L upper arm pain. Difficulty noted with elevation)    Lower Extremity Assessment Lower Extremity Assessment: Generalized weakness    Cervical / Trunk Assessment Cervical / Trunk Assessment: Kyphotic  Communication   Communication: HOH  Cognition Arousal/Alertness: Awake/alert Behavior During Therapy: WFL for tasks assessed/performed Overall Cognitive Status: History of cognitive impairments - at baseline  General Comments: possible dementia?      General Comments      Exercises     Assessment/Plan    PT Assessment Patient needs continued PT services  PT Problem List Decreased strength;Decreased mobility;Decreased balance;Decreased activity tolerance;Decreased knowledge of use of DME;Pain;Decreased cognition       PT Treatment Interventions DME instruction;Gait training;Other (comment);Therapeutic activities;Balance  training;Functional mobility training;Therapeutic exercise;Patient/family education    PT Goals (Current goals can be found in the Care Plan section)  Acute Rehab PT Goals Patient Stated Goal: daughter would like pt to be able to return home PT Goal Formulation: With patient/family Time For Goal Achievement: 09/27/16 Potential to Achieve Goals: Good    Frequency Min 3X/week   Barriers to discharge        Co-evaluation               AM-PAC PT "6 Clicks" Daily Activity  Outcome Measure Difficulty turning over in bed (including adjusting bedclothes, sheets and blankets)?: A Little Difficulty moving from lying on back to sitting on the side of the bed? : A Little Difficulty sitting down on and standing up from a chair with arms (e.g., wheelchair, bedside commode, etc,.)?: A Little Help needed moving to and from a bed to chair (including a wheelchair)?: A Little Help needed walking in hospital room?: A Little Help needed climbing 3-5 steps with a railing? : A Lot 6 Click Score: 17    End of Session Equipment Utilized During Treatment: Gait belt Activity Tolerance: Patient limited by fatigue;Patient limited by pain Patient left: in chair;with call bell/phone within reach;with chair alarm set;with family/visitor present   PT Visit Diagnosis: Muscle weakness (generalized) (M62.81);Difficulty in walking, not elsewhere classified (R26.2)    Time: 7169-6789 PT Time Calculation (min) (ACUTE ONLY): 22 min   Charges:   PT Evaluation $PT Eval Low Complexity: 1 Procedure     PT G Codes:          Weston Anna, MPT Pager: [phone]

## 2016-09-14 LAB — URINE CULTURE: Culture: >=100000 — AB

## 2016-10-27 DEATH — deceased

## 2016-12-27 ENCOUNTER — Ambulatory Visit: Payer: PPO | Admitting: Cardiology

## 2017-02-05 ENCOUNTER — Ambulatory Visit: Payer: Medicare HMO | Admitting: Family

## 2017-02-05 ENCOUNTER — Encounter (HOSPITAL_COMMUNITY): Payer: PPO
